# Patient Record
Sex: Male | Born: 1950 | Race: White | Hispanic: No | Marital: Married | Smoking: Current some day smoker
Health system: Southern US, Community
[De-identification: ages and names within clinical notes are randomized; demographics above are authoritative.]

## PROBLEM LIST (undated history)

## (undated) DIAGNOSIS — N189 Chronic kidney disease, unspecified: Secondary | ICD-10-CM

## (undated) DIAGNOSIS — I10 Essential (primary) hypertension: Secondary | ICD-10-CM

## (undated) DIAGNOSIS — K219 Gastro-esophageal reflux disease without esophagitis: Secondary | ICD-10-CM

## (undated) DIAGNOSIS — Z789 Other specified health status: Secondary | ICD-10-CM

## (undated) HISTORY — DX: Other specified health status: Z78.9

## (undated) HISTORY — DX: Chronic kidney disease, unspecified: N18.9

## (undated) HISTORY — PX: OTHER SURGICAL HISTORY: SHX169

## (undated) HISTORY — DX: Gastro-esophageal reflux disease without esophagitis: K21.9

## (undated) HISTORY — DX: Essential (primary) hypertension: I10

---

## 2020-06-10 ENCOUNTER — Ambulatory Visit
Admission: RE | Admit: 2020-06-10 | Discharge: 2020-06-10 | Disposition: A | Discharge status: Home / Self Care | Payer: Medicare Other | Source: Ambulatory Visit | Attending: Emergency Medicine | Admitting: Emergency Medicine

## 2020-06-10 ENCOUNTER — Other Ambulatory Visit: Payer: Self-pay

## 2020-06-10 VITALS — BP 185/93 | HR 89 | Temp 97.9°F | Resp 14 | Ht 66.0 in | Wt 160.0 lb

## 2020-06-10 DIAGNOSIS — R1012 Left upper quadrant pain: Secondary | ICD-10-CM

## 2020-06-10 DIAGNOSIS — R140 Abdominal distension (gaseous): Secondary | ICD-10-CM

## 2020-06-10 MED ORDER — DICYCLOMINE HCL 20 MG PO TABS: 20.0000 mg | ORAL_TABLET | Freq: Two times a day (BID) | ORAL | 0 refills | Status: DC

## 2020-06-10 NOTE — ED Triage Notes (Signed)
Intermittent LUQ pain x 6 months w/ bloating.  Moved here from [place] x 6 months ago

## 2020-06-10 NOTE — Discharge Instructions (Signed)
Unable to rule out diverticulitis in urgent care setting.  Offered patient further evaluation and management in the ED.  Patient declines at this time and would like to try outpatient therapy first.  Aware of the risk associated with this decision including missed diagnosis, organ damage, organ failure, and/or death.  Patient aware and in agreement.     Rest and push fluids  Bentyl for abdominal discomfort and bloating Begin keeping a food journal to see if certain foods are contributing to your symptoms Follow up with PCP for a more thorough work-up if symptoms persists If you experience new or worsening symptoms return or go to ER such as fever, chills, nausea, vomiting, diarrhea, bloody or dark tarry stools, constipation, urinary symptoms, worsening abdominal discomfort, symptoms that do not improve with medications, inability to keep fluids down, etc..Marland Kitchen

## 2020-06-10 NOTE — ED Provider Notes (Signed)
[place]   [id number] 06/10/20 Arrival Time: 1443  CC: ABDOMINAL DISCOMFORT  SUBJECTIVE:  Jeremy Rodriguez is a 70 y.o. male who presents with complaint of constant LUQ pain x 6 months, worse recently.  Symptoms began after moving here from [place].  Speculates change in diet may cause of symptoms.  Localizes pain to LUQ.  Describes as worsening, constant and sharp in character.  Has tried OTC medications with temporary relief.  Denies aggravating factors.  Denies similar symptoms in the past.  Last BM yesterday.  Reports intermittent constipation as well.    Denies fever, chills, appetite changes, weight changes, nausea, vomiting, chest pain, SOB, diarrhea, hematochezia, melena, dysuria, difficulty urinating, increased frequency or urgency, flank pain, loss of bowel or bladder function, vaginal discharge, vaginal odor, vaginal bleeding.  No LMP for male patient.  ROS: As per HPI.  All other pertinent ROS negative.     History reviewed. No pertinent past medical history. History reviewed. No pertinent surgical history. No Known Allergies No current facility-administered medications on file prior to encounter.   No current outpatient medications on file prior to encounter.   Social History   Socioeconomic History  . Marital status: Married    Spouse name: Not on file  . Number of children: Not on file  . Years of education: Not on file  . Highest education level: Not on file  Occupational History  . Not on file  Tobacco Use  . Smoking status: Current Some Day Smoker  . Smokeless tobacco: Never Used  Substance and Sexual Activity  . Alcohol use: Yes    Comment: occ  . Drug use: Never  . Sexual activity: Not on file  Other Topics Concern  . Not on file  Social History Narrative  . Not on file   Social Determinants of Health   Financial Resource Strain: Not on file  Food Insecurity: Not on file  Transportation Needs: Not on file  Physical Activity: Not on  file  Stress: Not on file  Social Connections: Not on file  Intimate Partner Violence: Not on file   No family history on file.   OBJECTIVE:  Vitals:   06/10/20 1010 06/10/20 1029  BP: (!) 185/93   Pulse: 89   Resp: 14   Temp: 97.9 F (36.6 C)   TempSrc: Oral   SpO2: 97%   Weight:  160 lb (72.6 kg)  Height:  5\' 6"  (1.676 m)    General appearance: Alert; NAD HEENT: NCAT.  Oropharynx clear.  Lungs: clear to auscultation bilaterally without adventitious breath sounds Heart: regular rate and rhythm.  Abdomen: soft, non-distended; normal active bowel sounds; TTP over LUQ; nontender at McBurney's point; no guarding Extremities: no edema; symmetrical with no gross deformities Skin: warm and dry Neurologic: normal gait Psychological: alert and cooperative; normal mood and affect  ASSESSMENT & PLAN:  1. LUQ discomfort   2. Bloating     Meds ordered this encounter  Medications  . dicyclomine (BENTYL) 20 MG tablet    Sig: Take 1 tablet (20 mg total) by mouth 2 (two) times daily.    Dispense:  20 tablet    Refill:  0    Order Specific Question:   Supervising Provider    Answer:   Raylene Everts [[id number]]   Unable to rule out diverticulitis in urgent care setting.  Offered patient further evaluation and management in the ED.  Patient declines at this time and would like to try outpatient therapy first.  Aware of the risk associated with this decision including missed diagnosis, organ damage, organ failure, and/or death.  Patient aware and in agreement.     Rest and push fluids  Bentyl for abdominal discomfort and bloating Begin keeping a food journal to see if certain foods are contributing to your symptoms Follow up with PCP for a more thorough work-up if symptoms persists If you experience new or worsening symptoms return or go to ER such as fever, chills, nausea, vomiting, diarrhea, bloody or dark tarry stools, constipation, urinary symptoms, worsening abdominal  discomfort, symptoms that do not improve with medications, inability to keep fluids down, etc...  Reviewed expectations re: course of current medical issues. Questions answered. Outlined signs and symptoms indicating need for more acute intervention. Patient verbalized understanding. After Visit Summary given.   Lestine Box, PA-C 06/10/20 1041

## 2020-06-24 ENCOUNTER — Encounter: Payer: Self-pay | Admitting: Internal Medicine

## 2020-06-24 ENCOUNTER — Ambulatory Visit (INDEPENDENT_AMBULATORY_CARE_PROVIDER_SITE_OTHER): Payer: Medicare Other | Admitting: Internal Medicine

## 2020-06-24 ENCOUNTER — Other Ambulatory Visit: Payer: Self-pay

## 2020-06-24 VITALS — BP 190/92 | HR 100 | Resp 18 | Ht 66.0 in | Wt 159.8 lb

## 2020-06-24 DIAGNOSIS — K5900 Constipation, unspecified: Secondary | ICD-10-CM

## 2020-06-24 DIAGNOSIS — F17210 Nicotine dependence, cigarettes, uncomplicated: Secondary | ICD-10-CM

## 2020-06-24 DIAGNOSIS — I10 Essential (primary) hypertension: Secondary | ICD-10-CM

## 2020-06-24 DIAGNOSIS — Z7689 Persons encountering health services in other specified circumstances: Secondary | ICD-10-CM

## 2020-06-24 DIAGNOSIS — K219 Gastro-esophageal reflux disease without esophagitis: Secondary | ICD-10-CM | POA: Diagnosis not present

## 2020-06-24 DIAGNOSIS — Z1159 Encounter for screening for other viral diseases: Secondary | ICD-10-CM | POA: Diagnosis not present

## 2020-06-24 DIAGNOSIS — Z114 Encounter for screening for human immunodeficiency virus [HIV]: Secondary | ICD-10-CM

## 2020-06-24 DIAGNOSIS — Z720 Tobacco use: Secondary | ICD-10-CM | POA: Insufficient documentation

## 2020-06-24 MED ORDER — DOCUSATE SODIUM 100 MG PO CAPS: 100.0000 mg | ORAL_CAPSULE | Freq: Every day | ORAL | 0 refills | Status: DC | PRN

## 2020-06-24 MED ORDER — TELMISARTAN 20 MG PO TABS: 20.0000 mg | ORAL_TABLET | Freq: Every day | ORAL | 0 refills | Status: DC

## 2020-06-24 MED ORDER — PANTOPRAZOLE SODIUM 40 MG PO TBEC
40.0000 mg | DELAYED_RELEASE_TABLET | Freq: Every day | ORAL | 3 refills | Status: DC
Start: 2020-06-24 — End: 2020-10-06

## 2020-06-24 NOTE — Assessment & Plan Note (Signed)
Chronic Started Colace Advised to maintain adequate hydration, at least 2 l of fluid intake

## 2020-06-24 NOTE — Progress Notes (Signed)
New Patient Office Visit  Subjective:  Patient ID: Jeremy Rodriguez, male    DOB: 11/21/1950  Age: 70 y.o. MRN: [id number]  CC:  Chief Complaint  Patient presents with  . New Patient (Initial Visit)    New patient had no pcp has been having bloating and stomach pains for about 2 months started after covid vaccine has tried alkaseltzer and tylenol     HPI Jeremy Rodriguez is a 70 year old male with no known past medical history who presents for establishing care.  His blood pressure was elevated on multiple measurements.  He states that he has been feeling nervous as this is his first visit.  He denies any headache, dizziness, chest pain, dyspnea or palpitations.  He complains of bloating and acid reflux sensation, which are intermittent and unrelated to food intake.  Of note, he admits to taking spicy food.  He denies any nausea, vomiting, melena or hematochezia.  He also has chronic constipation, for which he takes OTC fiber tablets.  He has not had any colonoscopy yet.  He has had 2 doses of COVID vaccine.  History reviewed. No pertinent past medical history.  History reviewed. No pertinent surgical history.  History reviewed. No pertinent family history.  Social History   Socioeconomic History  . Marital status: Married    Spouse name: Not on file  . Number of children: Not on file  . Years of education: Not on file  . Highest education level: Not on file  Occupational History  . Not on file  Tobacco Use  . Smoking status: Current Some Day Smoker  . Smokeless tobacco: Never Used  Substance and Sexual Activity  . Alcohol use: Yes    Comment: occ  . Drug use: Never  . Sexual activity: Not on file  Other Topics Concern  . Not on file  Social History Narrative  . Not on file   Social Determinants of Health   Financial Resource Strain: Not on file  Food Insecurity: Not on file  Transportation Needs: Not on file  Physical Activity: Not on file  Stress: Not on  file  Social Connections: Not on file  Intimate Partner Violence: Not on file    ROS Review of Systems  Constitutional: Negative for chills and fever.  HENT: Negative for congestion and sore throat.   Eyes: Negative for pain and discharge.  Respiratory: Negative for cough and shortness of breath.   Cardiovascular: Negative for chest pain and palpitations.  Gastrointestinal: Positive for abdominal distention and constipation. Negative for diarrhea, nausea and vomiting.  Endocrine: Negative for polydipsia and polyuria.  Genitourinary: Negative for dysuria and hematuria.  Musculoskeletal: Negative for neck pain and neck stiffness.  Skin: Negative for rash.  Neurological: Negative for dizziness, weakness, numbness and headaches.  Psychiatric/Behavioral: Negative for agitation and behavioral problems.    Objective:   Today's Vitals: BP (!) 190/92 (BP Location: Left Arm, Patient Position: Sitting)   Pulse 100   Resp 18   Ht '5\' 6"'  (1.676 m)   Wt 159 lb 12.8 oz (72.5 kg)   SpO2 98%   BMI 25.79 kg/m   Physical Exam Vitals reviewed.  Constitutional:      General: He is not in acute distress.    Appearance: He is not diaphoretic.  HENT:     Head: Normocephalic and atraumatic.     Nose: Nose normal.     Mouth/Throat:     Mouth: Mucous membranes are moist.  Eyes:     General:  No scleral icterus.    Extraocular Movements: Extraocular movements intact.     Pupils: Pupils are equal, round, and reactive to light.  Cardiovascular:     Rate and Rhythm: Normal rate and regular rhythm.     Pulses: Normal pulses.     Heart sounds: Normal heart sounds. No murmur heard.   Pulmonary:     Breath sounds: Normal breath sounds. No wheezing or rales.  Abdominal:     Palpations: Abdomen is soft.     Tenderness: There is no abdominal tenderness.  Musculoskeletal:     Cervical back: Neck supple. No tenderness.     Right lower leg: No edema.     Left lower leg: No edema.  Skin:     General: Skin is warm.     Findings: No rash.  Neurological:     General: No focal deficit present.     Mental Status: He is alert and oriented to person, place, and time.     Sensory: No sensory deficit.     Motor: No weakness.  Psychiatric:        Mood and Affect: Mood normal.        Behavior: Behavior normal.     Assessment & Plan:   Problem List Items Addressed This Visit      Encounter to establish care - Primary   Care established Previous chart reviewed History and medications reviewed with the patient      Relevant Orders  CBC  CMP14+EGFR  Lipid panel  HgB A1c  TSH    Cardiovascular and Mediastinum   Primary hypertension    BP Readings from Last 1 Encounters:  06/24/20 (!) 190/92   New-onset Could be elevated due to nervousness, but does not justify to have such an elevation of BP Started Telmisartan 20 mg QD Counseled for compliance with the medications Advised DASH diet and moderate exercise/walking, at least 150 mins/week       Relevant Medications   telmisartan (MICARDIS) 20 MG tablet     Digestive   Gastroesophageal reflux disease    Acid reflux and bloating could be due to GERD/gastritis Patient admits that he likes spicy food. Trial of Pantoprazole Referred to GI      Relevant Medications   pantoprazole (PROTONIX) 40 MG tablet   docusate sodium (COLACE) 100 MG capsule   Other Relevant Orders   Ambulatory referral to Gastroenterology     Other      Constipation    Chronic Started Colace Advised to maintain adequate hydration, at least 2 l of fluid intake      Relevant Medications   docusate sodium (COLACE) 100 MG capsule   Other Relevant Orders   Ambulatory referral to Gastroenterology   Tobacco abuse    Smokes about 1 pack/week.  Asked about quitting: confirms that he currently smokes cigarettes Advise to quit smoking: Educated about QUITTING to reduce the risk of cancer, cardio and cerebrovascular disease. Assess  willingness: Unwilling to quit at this time, but is working on cutting back. Assist with counseling and pharmacotherapy: Counseled for 5 minutes and literature provided. Arrange for follow up: Follow up in 3 months and continue to offer help.       Other Visit Diagnoses    Screening for HIV without presence of risk factors       Relevant Orders   HIV antibody (with reflex)   Encounter for HCV screening test for low risk patient       Relevant Orders  Hepatitis C Antibody      Outpatient Encounter Medications as of 06/24/2020  Medication Sig  . docusate sodium (COLACE) 100 MG capsule Take 1 capsule (100 mg total) by mouth daily as needed for mild constipation.  . pantoprazole (PROTONIX) 40 MG tablet Take 1 tablet (40 mg total) by mouth daily.  Marland Kitchen telmisartan (MICARDIS) 20 MG tablet Take 1 tablet (20 mg total) by mouth daily.  . [DISCONTINUED] dicyclomine (BENTYL) 20 MG tablet Take 1 tablet (20 mg total) by mouth 2 (two) times daily. (Patient not taking: Reported on 06/24/2020)   No facility-administered encounter medications on file as of 06/24/2020.    Follow-up: Return in about 3 weeks (around 07/15/2020) for Annual physical.   Lindell Spar, MD

## 2020-06-24 NOTE — Assessment & Plan Note (Addendum)
Care established Previous chart reviewed History and medications reviewed with the patient 

## 2020-06-24 NOTE — Patient Instructions (Signed)
Please start taking Olmesartan for blood pressure.  Please start taking Pantoprazole for gastritis/acid reflux and docusate for constipation.  Please get fasting blood tests before the next visit.  Please follow DASH diet and perform moderate exercise/walking at least 150 mins/week.

## 2020-06-24 NOTE — Assessment & Plan Note (Signed)
Acid reflux and bloating could be due to GERD/gastritis Patient admits that he likes spicy food. Trial of Pantoprazole Referred to GI

## 2020-06-24 NOTE — Assessment & Plan Note (Signed)
BP Readings from Last 1 Encounters:  06/24/20 (!) 190/92   New-onset Could be elevated due to nervousness, but does not justify to have such an elevation of BP Started Telmisartan 20 mg QD Counseled for compliance with the medications Advised DASH diet and moderate exercise/walking, at least 150 mins/week

## 2020-06-24 NOTE — Assessment & Plan Note (Signed)
Smokes about 1 pack/week.  Asked about quitting: confirms that he currently smokes cigarettes Advise to quit smoking: Educated about QUITTING to reduce the risk of cancer, cardio and cerebrovascular disease. Assess willingness: Unwilling to quit at this time, but is working on cutting back. Assist with counseling and pharmacotherapy: Counseled for 5 minutes and literature provided. Arrange for follow up: Follow up in 3 months and continue to offer help.

## 2020-06-30 ENCOUNTER — Other Ambulatory Visit: Payer: Self-pay | Admitting: Internal Medicine

## 2020-06-30 DIAGNOSIS — I10 Essential (primary) hypertension: Secondary | ICD-10-CM

## 2020-07-01 ENCOUNTER — Telehealth (INDEPENDENT_AMBULATORY_CARE_PROVIDER_SITE_OTHER): Payer: Medicare Other | Admitting: Internal Medicine

## 2020-07-01 ENCOUNTER — Other Ambulatory Visit: Payer: Self-pay

## 2020-07-01 ENCOUNTER — Encounter: Payer: Self-pay | Admitting: Internal Medicine

## 2020-07-01 DIAGNOSIS — R109 Unspecified abdominal pain: Secondary | ICD-10-CM | POA: Diagnosis not present

## 2020-07-01 DIAGNOSIS — R140 Abdominal distension (gaseous): Secondary | ICD-10-CM

## 2020-07-01 DIAGNOSIS — K5900 Constipation, unspecified: Secondary | ICD-10-CM

## 2020-07-01 MED ORDER — DICYCLOMINE HCL 10 MG PO CAPS: 10.0000 mg | ORAL_CAPSULE | Freq: Three times a day (TID) | ORAL | 0 refills | Status: DC | PRN

## 2020-07-01 NOTE — Progress Notes (Signed)
Virtual Visit via Telephone Note   This visit type was conducted due to national recommendations for restrictions regarding the COVID-19 Pandemic (e.g. social distancing) in an effort to limit this patient's exposure and mitigate transmission in our community.  Due to his co-morbid illnesses, this patient is at least at moderate risk for complications without adequate follow up.  This format is felt to be most appropriate for this patient at this time.  The patient did not have access to video technology/had technical difficulties with video requiring transitioning to audio format only (telephone).  All issues noted in this document were discussed and addressed.  No physical exam could be performed with this format.  Evaluation Performed:  Follow-up visit  Date:  07/01/2020   ID:  Jeremy Rodriguez, Jeremy Rodriguez 01-Jun-1950, MRN [id number]  Patient Location: Home Provider Location: Office/Clinic  Participants: Patient Location of Patient: Home Location of Provider: Telehealth Consent was obtain for visit to be over via telehealth. I verified that I am speaking with the correct person using two identifiers.  PCP:  Lindell Spar, MD   Chief Complaint:  Abdominal pain and cramps  History of Present Illness:    Jeremy Rodriguez is a 70 y.o. male who has a televisit for c/o abdominal bloating and cramping. He was recently placed on Pantoprazole for concern for GERD, but he states that it did not help him. He gets relief only with Alkaseltzer. Denies any melena or hematochezia.  The patient does not have symptoms concerning for COVID-19 infection (fever, chills, cough, or new shortness of breath).   Past Medical, Surgical, Social History, Allergies, and Medications have been Reviewed.  History reviewed. No pertinent past medical history. History reviewed. No pertinent surgical history.   Current Meds  Medication Sig  . dicyclomine (BENTYL) 10 MG capsule Take 1 capsule (10 mg total) by  mouth 3 (three) times daily as needed for spasms.  Marland Kitchen docusate sodium (COLACE) 100 MG capsule Take 1 capsule (100 mg total) by mouth daily as needed for mild constipation.  . pantoprazole (PROTONIX) 40 MG tablet Take 1 tablet (40 mg total) by mouth daily.  Marland Kitchen telmisartan (MICARDIS) 20 MG tablet Take 1 tablet by mouth once daily     Allergies:   Patient has no known allergies.   ROS:   Please see the history of present illness.    All other systems reviewed and are negative.   Labs/Other Tests and Data Reviewed:    Recent Labs: No results found for requested labs within last 8760 hours.   Recent Lipid Panel No results found for: CHOL, TRIG, HDL, CHOLHDL, LDLCALC, LDLDIRECT  Wt Readings from Last 3 Encounters:  06/24/20 159 lb 12.8 oz (72.5 kg)  06/10/20 160 lb (72.6 kg)      ASSESSMENT & PLAN:    Abdominal bloating with spasms Prescribed Bentyl Referred to GI If persistent, will check for CT abdomen and/or stool studies Advised to hold PPI for now and will check for H Pylori after 2 weeks  Constipation Continue Colace   Time:   Today, I have spent 9 minutes reviewing the chart, including problem list, medications, and with the patient with telehealth technology discussing the above problems.   Medication Adjustments/Labs and Tests Ordered: Current medicines are reviewed at length with the patient today.  Concerns regarding medicines are outlined above.   Tests Ordered: No orders of the defined types were placed in this encounter.   Medication Changes: Meds ordered this encounter  Medications  .  dicyclomine (BENTYL) 10 MG capsule    Sig: Take 1 capsule (10 mg total) by mouth 3 (three) times daily as needed for spasms.    Dispense:  30 capsule    Refill:  0     Note: This dictation was prepared with Dragon dictation along with smaller phrase technology. Similar sounding words can be transcribed inadequately or may not be corrected upon review. Any  transcriptional errors that result from this process are unintentional.      Disposition:  Follow up  Signed, Lindell Spar, MD  07/01/2020 1:13 PM     [facility]

## 2020-07-01 NOTE — Patient Instructions (Signed)
Please do not take Pantoprazole till H Pylori breath test.  Please take Dicyclomine for abdominal cramps.  Avoid hot and spicy food.

## 2020-07-03 ENCOUNTER — Ambulatory Visit: Payer: Medicare Other | Admitting: Gastroenterology

## 2020-07-03 ENCOUNTER — Encounter: Payer: Self-pay | Admitting: Gastroenterology

## 2020-07-03 ENCOUNTER — Other Ambulatory Visit: Payer: Self-pay

## 2020-07-03 ENCOUNTER — Ambulatory Visit (HOSPITAL_COMMUNITY)
Admission: RE | Admit: 2020-07-03 | Discharge: 2020-07-03 | Disposition: A | Discharge status: Home / Self Care | Payer: Medicare Other | Source: Ambulatory Visit | Attending: Gastroenterology | Admitting: Gastroenterology

## 2020-07-03 VITALS — BP 165/88 | HR 86 | Temp 97.6°F | Ht 66.0 in | Wt 158.4 lb

## 2020-07-03 DIAGNOSIS — Z1159 Encounter for screening for other viral diseases: Secondary | ICD-10-CM | POA: Diagnosis not present

## 2020-07-03 DIAGNOSIS — K5900 Constipation, unspecified: Secondary | ICD-10-CM

## 2020-07-03 DIAGNOSIS — R634 Abnormal weight loss: Secondary | ICD-10-CM | POA: Diagnosis not present

## 2020-07-03 DIAGNOSIS — Z114 Encounter for screening for human immunodeficiency virus [HIV]: Secondary | ICD-10-CM

## 2020-07-03 DIAGNOSIS — R109 Unspecified abdominal pain: Secondary | ICD-10-CM

## 2020-07-03 MED ORDER — IOHEXOL 9 MG/ML PO SOLN
ORAL | Status: AC
  Filled 2020-07-03: qty 1000

## 2020-07-03 NOTE — Patient Instructions (Signed)
Please have blood work done at Dr. Serita Grit office.  We are ordering a CT scan for you to be done as soon as possible.  For constipation: Start taking Linzess 1 capsule 30 minutes before breakfast daily. It is normal to have some looser stool starting out for the first few days, but this should improve. If it does not, please call us, as we will need to adjust the dosage. We can always increase this if needed.  Continue pantoprazole (Protonix) once each morning, 30 minutes before breakfast.  Stop dicyclomine (Bentyl) as this could worsen constipation.  Further recommendations to follow!  It was a pleasure to see you today. I want to create trusting relationships with patients to provide genuine, compassionate, and quality care. I value your feedback. If you receive a survey regarding your visit,  I greatly appreciate you taking time to fill this out.   Annitta Needs, PhD, ANP-BC [facility]

## 2020-07-03 NOTE — Progress Notes (Signed)
Primary Care Physician:  Lindell Spar, MD  Referring Physician: Dr. Posey Pronto Primary Gastroenterologist:  Dr. Abbey Chatters  Chief Complaint  Patient presents with  . Bloated  . Abdominal Pain    RUQ/LUQ  . Weight Loss    35lbs in 6 months. Feels bloated and no desire to eat  . Constipation    HPI:   Jeremy Rodriguez is a 70 y.o. male presenting today at the request of Dr. Posey Pronto due to constipation and GERD. He recently established care with Dr. Posey Pronto but previously had no health maintenance his whole life, stating he always felt well.    February last year took Covid vaccines and then feels like he saw difference a few weeks later. Mother passed away from [facility] 05-30-2019, and he traveled to [place] for her funeral. He noted while in [place] that he had a change in bowel habits, constipation, abdominal pain.  Takes alka seltzer and tylenol every 4-5 houres for pain. Feels bloated, no appetite. Rare BMs. Used to be able to eat hot, spicy no issues. Prior to Feb would have BM usually daily. Will go up to a week at a time. Has to strain. No rectal bleeding. No dysphagia. No desire to eat at all. Eats one time a day. Lost about 25 lbs, used to weigh in the high 170s. No family history of colon cancer. No family history of colon polyps, pancreatic, or liver disease.    Wakes up in middle of night with pain. Has pain in LUQ mainly but also upper abdomen. Takes pantoprazole once daily. No longer taking Micardis.   Bentyl daily.   Past Medical History:  Diagnosis Date  . Medical history non-contributory     Past Surgical History:  Procedure Laterality Date  . appendectomy      Current Outpatient Medications  Medication Sig Dispense Refill  . dicyclomine (BENTYL) 10 MG capsule Take 1 capsule (10 mg total) by mouth 3 (three) times daily as needed for spasms. 30 capsule 0  . docusate sodium (COLACE) 100 MG capsule Take 1 capsule (100 mg total) by mouth daily as needed for mild  constipation. 30 capsule 0  . pantoprazole (PROTONIX) 40 MG tablet Take 1 tablet (40 mg total) by mouth daily. 30 tablet 3   No current facility-administered medications for this visit.    Allergies as of 07/03/2020  . (No Known Allergies)    Family History  Problem Relation Age of Onset  . Colon cancer Neg Hx   . Colon polyps Neg Hx   . Liver cancer Neg Hx   . Pancreatic cancer Neg Hx     Social History   Socioeconomic History  . Marital status: Married    Spouse name: Not on file  . Number of children: Not on file  . Years of education: Not on file  . Highest education level: Not on file  Occupational History  . Not on file  Tobacco Use  . Smoking status: Current Some Day Smoker  . Smokeless tobacco: Never Used  . Tobacco comment: a few cigarettes a day  Substance and Sexual Activity  . Alcohol use: Yes    Comment: occ  . Drug use: Never  . Sexual activity: Not on file  Other Topics Concern  . Not on file  Social History Narrative  . Not on file   Social Determinants of Health   Financial Resource Strain: Not on file  Food Insecurity: Not on file  Transportation Needs: Not  on file  Physical Activity: Not on file  Stress: Not on file  Social Connections: Not on file  Intimate Partner Violence: Not on file    Review of Systems: Gen: see HPI CV: Denies chest pain, heart palpitations, peripheral edema, syncope.  Resp: Denies shortness of breath at rest or with exertion. Denies wheezing or cough.  GI: see HPI GU : Denies urinary burning, urinary frequency, urinary hesitancy MS: Denies joint pain, muscle weakness, cramps, or limitation of movement.  Derm: Denies rash, itching, dry skin Psych: Denies depression, anxiety, memory loss, and confusion Heme: Denies bruising, bleeding, and enlarged lymph nodes.  Physical Exam: BP (!) 165/88   Pulse 86   Temp 97.6 F (36.4 C)   Ht 5\' 6"  (1.676 m)   Wt 158 lb 6.4 oz (71.8 kg)   BMI 25.57 kg/m  General:    Alert and oriented. Pleasant and cooperative. Well-nourished and well-developed.  Head:  Normocephalic and atraumatic. Eyes:  Without icterus, sclera clear and conjunctiva pink.  Ears:  Normal auditory acuity. Mouth: mask in place Lungs:  Clear to auscultation bilaterally. No wheezes, rales, or rhonchi. No distress.  Heart:  S1, S2 present without murmurs appreciated.  Abdomen:  +BS, soft, mild TTP LLQ and non-distended. No HSM noted. No guarding or rebound. No masses appreciated.  Rectal:  declined Msk:  Symmetrical without gross deformities. Normal posture. Extremities:  Without edema. Neurologic:  Alert and  oriented x4;  grossly normal neurologically. Skin:  Intact without significant lesions or rashes. Psych:  Alert and cooperative. Normal mood and affect.  ASSESSMENT: Jeremy Rodriguez is a 70 y.o. male presenting today with at least year long symptoms of abdominal pain, change in bowel habits and now constipation, and self-reported weight loss, with onset around February of last year.  Up until last year, he reports no chronic health conditions and actually had no health maintenance up until recently when he established care with Dr. Posey Pronto here in [place].   Nocturnal abdominal pain and weight loss are concerning. He is declining a rectal exam today. We discussed colonoscopy/EGD, but he wants to hold off on this unless absolutely necessary. However, he is agreeable to CT scan and labs. Differentials broad but concern for occult malignancy especially in light of marked change in bowel habits and weight loss. Unless obvious findings on CT, still highly recommend colonoscopy/EGD in near future.  I have ordered CT abd/pelvis for today, and I have also placed lab orders to be done by patient. Will make sure to copy PCP on this, as some were ordered for primary care purposes.   For constipation: start Linzess 145 mcg daily. Samples provided.  Continue Protonix daily. I have asked him to  stop Bentyl as this could worsen constipation.    PLAN: CT abd/pelvis today Labs ordered Continue Protonix Start Linzess 145 mcg daily Recommend colonoscopy/EGD after review of CT Further recommendations to follow   Annitta Needs, PhD, ANP-BC [facility]

## 2020-07-06 ENCOUNTER — Telehealth: Payer: Self-pay | Admitting: Gastroenterology

## 2020-07-06 DIAGNOSIS — R109 Unspecified abdominal pain: Secondary | ICD-10-CM

## 2020-07-06 DIAGNOSIS — R634 Abnormal weight loss: Secondary | ICD-10-CM

## 2020-07-06 NOTE — Telephone Encounter (Signed)
Patient called back and states he does not want to have CT done because he has to drink contrast. He states he doesn't want to drink anything to have a test done. Wants to know if others ways can be done to check what Jeremy Rodriguez is wanting to check

## 2020-07-06 NOTE — Telephone Encounter (Signed)
[facility] clinical pool:  When will CT be? It looks like he was to have it Friday but did not complete? Can we make sure this is scheduled for this week.

## 2020-07-06 NOTE — Progress Notes (Signed)
Cc'ed to pcp °

## 2020-07-06 NOTE — Telephone Encounter (Signed)
Patient was scheduled for Friday and per note "Pt left, did not want to drink contrast and wait". Was not rescheduled.  I called pt to see if he still wants to have done. LMOVM for him to call back

## 2020-07-07 ENCOUNTER — Other Ambulatory Visit: Payer: Self-pay | Admitting: Gastroenterology

## 2020-07-07 MED ORDER — LINACLOTIDE 145 MCG PO CAPS: 145.0000 ug | ORAL_CAPSULE | Freq: Every day | ORAL | 5 refills | Status: DC

## 2020-07-07 NOTE — Telephone Encounter (Signed)
I contacted patient. He does not want to drink anything.   We can do the CT with IV contrast only. He is not wanting to do an endoscopy/colonoscopy at this time. Please arrange CT with IV contrast only.  He will get labs done tomorrow.  I have sent in Linzess 145 mcg daily to his pharmacy, as this is working well for him and has helped with constipation, abdominal pain, and bloating.

## 2020-07-07 NOTE — Addendum Note (Signed)
Addended by: Cheron Every on: 07/07/2020 04:15 PM   Modules accepted: Orders

## 2020-07-07 NOTE — Addendum Note (Signed)
Addended by: Cheron Every on: 07/07/2020 04:21 PM   Modules accepted: Orders

## 2020-07-07 NOTE — Telephone Encounter (Signed)
Called CS and pt scheduled for 4/8 at 1:00pm, arrival 12:45pm, npo 4 hrs prior  Called pt, LMOVM

## 2020-07-07 NOTE — Progress Notes (Signed)
Linzess 145 mcg sent to pharmacy.

## 2020-07-08 NOTE — Telephone Encounter (Signed)
Called pt and made aware of CT appt. He voiced understanding and had no questions

## 2020-07-10 ENCOUNTER — Ambulatory Visit (HOSPITAL_COMMUNITY)
Admission: RE | Admit: 2020-07-10 | Discharge: 2020-07-10 | Disposition: A | Discharge status: Home / Self Care | Payer: Medicare Other | Source: Ambulatory Visit | Attending: Gastroenterology | Admitting: Gastroenterology

## 2020-07-10 ENCOUNTER — Other Ambulatory Visit: Payer: Self-pay

## 2020-07-10 DIAGNOSIS — R634 Abnormal weight loss: Secondary | ICD-10-CM | POA: Insufficient documentation

## 2020-07-10 DIAGNOSIS — R109 Unspecified abdominal pain: Secondary | ICD-10-CM | POA: Diagnosis not present

## 2020-07-10 LAB — POCT I-STAT CREATININE: Creatinine, Ser: 1 mg/dL (ref 0.61–1.24)

## 2020-07-10 MED ORDER — IOHEXOL 300 MG/ML  SOLN
100.0000 mL | Freq: Once | INTRAMUSCULAR | Status: AC | PRN
  Administered 2020-07-10: 100 mL via INTRAVENOUS

## 2020-07-13 ENCOUNTER — Telehealth: Payer: Self-pay

## 2020-07-13 NOTE — Telephone Encounter (Signed)
Addressed under result note 

## 2020-07-13 NOTE — Telephone Encounter (Signed)
Diane from [facility] called with a report for this pt. Please review CT report in Epic asap.

## 2020-07-14 ENCOUNTER — Other Ambulatory Visit: Payer: Self-pay

## 2020-07-14 DIAGNOSIS — N2889 Other specified disorders of kidney and ureter: Secondary | ICD-10-CM

## 2020-07-14 DIAGNOSIS — R160 Hepatomegaly, not elsewhere classified: Secondary | ICD-10-CM

## 2020-07-15 ENCOUNTER — Other Ambulatory Visit: Payer: Self-pay

## 2020-07-15 ENCOUNTER — Encounter: Payer: Self-pay | Admitting: Internal Medicine

## 2020-07-15 ENCOUNTER — Telehealth: Payer: Self-pay

## 2020-07-15 ENCOUNTER — Ambulatory Visit (INDEPENDENT_AMBULATORY_CARE_PROVIDER_SITE_OTHER): Payer: Medicare Other | Admitting: Internal Medicine

## 2020-07-15 VITALS — BP 168/90 | HR 85 | Temp 97.6°F | Ht 66.0 in | Wt 154.0 lb

## 2020-07-15 DIAGNOSIS — I10 Essential (primary) hypertension: Secondary | ICD-10-CM | POA: Diagnosis not present

## 2020-07-15 DIAGNOSIS — G893 Neoplasm related pain (acute) (chronic): Secondary | ICD-10-CM | POA: Diagnosis not present

## 2020-07-15 DIAGNOSIS — Z0000 Encounter for general adult medical examination without abnormal findings: Secondary | ICD-10-CM

## 2020-07-15 DIAGNOSIS — Z7689 Persons encountering health services in other specified circumstances: Secondary | ICD-10-CM | POA: Diagnosis not present

## 2020-07-15 DIAGNOSIS — N2889 Other specified disorders of kidney and ureter: Secondary | ICD-10-CM | POA: Diagnosis not present

## 2020-07-15 DIAGNOSIS — C7910 Secondary malignant neoplasm of unspecified urinary organs: Secondary | ICD-10-CM | POA: Diagnosis not present

## 2020-07-15 DIAGNOSIS — Z1159 Encounter for screening for other viral diseases: Secondary | ICD-10-CM | POA: Diagnosis not present

## 2020-07-15 MED ORDER — AMLODIPINE BESYLATE 5 MG PO TABS: 5.0000 mg | ORAL_TABLET | Freq: Every day | ORAL | 1 refills | Status: DC

## 2020-07-15 MED ORDER — OXYCODONE-ACETAMINOPHEN 5-325 MG PO TABS
1.0000 | ORAL_TABLET | Freq: Four times a day (QID) | ORAL | 0 refills | Status: DC | PRN
Start: 2020-07-15 — End: 2020-07-22

## 2020-07-15 NOTE — Assessment & Plan Note (Signed)
CT abdomen reviewed Has appointment with Oncology for further management

## 2020-07-15 NOTE — Telephone Encounter (Signed)
Error

## 2020-07-15 NOTE — Assessment & Plan Note (Signed)
Annual exam as documented. Counseling done  re healthy lifestyle involving commitment to 150 minutes exercise per week, heart healthy diet, and attaining healthy weight.The importance of adequate sleep also discussed. Changes in health habits are decided on by the patient with goals and time frames  set for achieving them. Immunization and cancer screening needs are specifically addressed at this visit. 

## 2020-07-15 NOTE — Assessment & Plan Note (Signed)
BP Readings from Last 1 Encounters:  07/15/20 (!) 168/90   Uncontrolled Started Amlodipine Had started Telmisartan, but would avoid currently in the context of renal mass Counseled for compliance with the medications Advised DASH diet and moderate exercise/walking as tolerated

## 2020-07-15 NOTE — Assessment & Plan Note (Signed)
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

## 2020-07-15 NOTE — Progress Notes (Signed)
Established Patient Office Visit  Subjective:  Patient ID: Jeremy Rodriguez, male    DOB: [birth year]  Age: 70 y.o. MRN: [id number]  CC:  Chief Complaint  Patient presents with  . Annual Exam    CPE  . Abdominal Pain    Was told he has a mass on L side, sees oncology next week, but is having pain that Tylenol isn't helping.    HPI Jeremy Rodriguez is a 70 year old male with PMH of HTN, GERD and left renal mass who presents for annual physical.  He has been having left abdominal pain/discomfort and it is not relieved with Tylenol. CT abdomen revealed left renal mass, for which he has an appointment with Oncology. He denies any dysuria, hematuria, melena or hematochezia.  His BP was elevated today and has not been taking any medication for it.  He has been on Pantoprazole for GERD.  Past Medical History:  Diagnosis Date  . Medical history non-contributory     Past Surgical History:  Procedure Laterality Date  . appendectomy      Family History  Problem Relation Age of Onset  . Colon cancer Neg Hx   . Colon polyps Neg Hx   . Liver cancer Neg Hx   . Pancreatic cancer Neg Hx     Social History   Socioeconomic History  . Marital status: Married    Spouse name: Not on file  . Number of children: Not on file  . Years of education: Not on file  . Highest education level: Not on file  Occupational History  . Not on file  Tobacco Use  . Smoking status: Current Some Day Smoker  . Smokeless tobacco: Never Used  . Tobacco comment: a few cigarettes a day  Substance and Sexual Activity  . Alcohol use: Yes    Comment: occ  . Drug use: Never  . Sexual activity: Not on file  Other Topics Concern  . Not on file  Social History Narrative  . Not on file   Social Determinants of Health   Financial Resource Strain: Not on file  Food Insecurity: Not on file  Transportation Needs: Not on file  Physical Activity: Not on file  Stress: Not on file  Social Connections:  Not on file  Intimate Partner Violence: Not on file    Outpatient Medications Prior to Visit  Medication Sig Dispense Refill  . docusate sodium (COLACE) 100 MG capsule Take 1 capsule (100 mg total) by mouth daily as needed for mild constipation. 30 capsule 0  . linaclotide (LINZESS) 145 MCG CAPS capsule Take 1 capsule (145 mcg total) by mouth daily before breakfast. 30 capsule 5  . pantoprazole (PROTONIX) 40 MG tablet Take 1 tablet (40 mg total) by mouth daily. 30 tablet 3  . dicyclomine (BENTYL) 10 MG capsule Take 1 capsule (10 mg total) by mouth 3 (three) times daily as needed for spasms. 30 capsule 0   No facility-administered medications prior to visit.    No Known Allergies  ROS Review of Systems  Constitutional: Negative for chills and fever.  HENT: Negative for congestion and sore throat.   Eyes: Negative for pain and discharge.  Respiratory: Negative for cough and shortness of breath.   Cardiovascular: Negative for chest pain and palpitations.  Gastrointestinal: Positive for abdominal distention and abdominal pain (LLQ). Negative for constipation, diarrhea, nausea and vomiting.  Endocrine: Negative for polydipsia and polyuria.  Genitourinary: Negative for dysuria and hematuria.  Musculoskeletal: Negative for neck pain and neck  stiffness.  Skin: Negative for rash.  Neurological: Negative for dizziness, weakness, numbness and headaches.  Psychiatric/Behavioral: Negative for agitation and behavioral problems.      Objective:    Physical Exam Vitals reviewed.  Constitutional:      General: He is not in acute distress.    Appearance: He is not diaphoretic.  HENT:     Head: Normocephalic and atraumatic.     Nose: Nose normal.     Mouth/Throat:     Mouth: Mucous membranes are moist.  Eyes:     General: No scleral icterus.    Extraocular Movements: Extraocular movements intact.     Pupils: Pupils are equal, round, and reactive to light.  Cardiovascular:     Rate and  Rhythm: Normal rate and regular rhythm.     Pulses: Normal pulses.     Heart sounds: Normal heart sounds. No murmur heard.   Pulmonary:     Breath sounds: Normal breath sounds. No wheezing or rales.  Abdominal:     Palpations: Abdomen is soft.     Tenderness: There is abdominal tenderness (Mild (LLQ)). There is no guarding or rebound.  Musculoskeletal:     Cervical back: Neck supple. No tenderness.     Right lower leg: No edema.     Left lower leg: No edema.  Skin:    General: Skin is warm.     Findings: No rash.  Neurological:     General: No focal deficit present.     Mental Status: He is alert and oriented to person, place, and time.     Sensory: No sensory deficit.     Motor: No weakness.  Psychiatric:        Mood and Affect: Mood is anxious.        Behavior: Behavior normal.     BP (!) 168/90 (BP Location: Right Arm, Patient Position: Sitting, Cuff Size: Normal)   Pulse 85   Temp 97.6 F (36.4 C) (Temporal)   Ht 5\' 6"  (1.676 m)   Wt 154 lb (69.9 kg)   SpO2 98%   BMI 24.86 kg/m  Wt Readings from Last 3 Encounters:  07/15/20 154 lb (69.9 kg)  07/03/20 158 lb 6.4 oz (71.8 kg)  06/24/20 159 lb 12.8 oz (72.5 kg)     Health Maintenance Due  Topic Date Due  . Hepatitis C Screening  Never done    There are no preventive care reminders to display for this patient.  No results found for: TSH No results found for: WBC, HGB, HCT, MCV, PLT Lab Results  Component Value Date   CREATININE 1.00 07/10/2020   No results found for: CHOL No results found for: HDL No results found for: LDLCALC No results found for: TRIG No results found for: CHOLHDL No results found for: HGBA1C    Assessment & Plan:   Problem List Items Addressed This Visit      Annual physical exam - Primary   Annual exam as documented. Counseling done  re healthy lifestyle involving commitment to 150 minutes exercise per week, heart healthy diet, and attaining healthy weight.The importance of  adequate sleep also discussed. Changes in health habits are decided on by the patient with goals and time frames  set for achieving them. Immunization and cancer screening needs are specifically addressed at this visit.      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 oxyCODONE-acetaminophen (PERCOCET/ROXICET) 5-325 MG tablet    Sig: Take 1 tablet by mouth every 6 (six) hours as needed for severe pain.    Dispense:  30 tablet    Refill:  0  . amLODipine (NORVASC) 5 MG tablet    Sig: Take 1 tablet (5 mg total) by mouth daily.    Dispense:  90 tablet    Refill:  1    Follow-up: Return in about 4 months (around 11/14/2020) for HTN and renal mass.    Lindell Spar, MD

## 2020-07-15 NOTE — Patient Instructions (Signed)
Please take Percocet for severe pain.  Please follow up with Oncology as scheduled.  Please start taking Amlodipine for high BP. Continue to follow low salt diet.  Please refrain from smoking.

## 2020-07-16 ENCOUNTER — Other Ambulatory Visit: Payer: Self-pay

## 2020-07-16 DIAGNOSIS — R634 Abnormal weight loss: Secondary | ICD-10-CM

## 2020-07-16 DIAGNOSIS — N2889 Other specified disorders of kidney and ureter: Secondary | ICD-10-CM

## 2020-07-16 DIAGNOSIS — R160 Hepatomegaly, not elsewhere classified: Secondary | ICD-10-CM

## 2020-07-16 LAB — CMP14+EGFR
ALT: 10 IU/L (ref 0–44)
AST: 16 IU/L (ref 0–40)
Albumin/Globulin Ratio: 1.3 (ref 1.2–2.2)
Albumin: 4.2 g/dL (ref 3.8–4.8)
Alkaline Phosphatase: 68 IU/L (ref 44–121)
BUN/Creatinine Ratio: 9 — ABNORMAL LOW (ref 10–24)
BUN: 10 mg/dL (ref 8–27)
Bilirubin Total: 0.5 mg/dL (ref 0.0–1.2)
CO2: 22 mmol/L (ref 20–29)
Calcium: 9.7 mg/dL (ref 8.6–10.2)
Chloride: 102 mmol/L (ref 96–106)
Creatinine, Ser: 1.13 mg/dL (ref 0.76–1.27)
Globulin, Total: 3.3 g/dL (ref 1.5–4.5)
Glucose: 94 mg/dL (ref 65–99)
Potassium: 4.9 mmol/L (ref 3.5–5.2)
Sodium: 140 mmol/L (ref 134–144)
Total Protein: 7.5 g/dL (ref 6.0–8.5)
eGFR: 70 mL/min/{1.73_m2} (ref >59)

## 2020-07-16 LAB — LIPID PANEL
Chol/HDL Ratio: 4.3 ratio (ref 0.0–5.0)
Cholesterol, Total: 180 mg/dL (ref 100–199)
HDL: 42 mg/dL (ref >39)
LDL Chol Calc (NIH): 103 mg/dL — ABNORMAL HIGH (ref 0–99)
Triglycerides: 205 mg/dL — ABNORMAL HIGH (ref 0–149)
VLDL Cholesterol Cal: 35 mg/dL (ref 5–40)

## 2020-07-16 LAB — CBC
Hematocrit: 42.5 % (ref 37.5–51.0)
Hemoglobin: 13.9 g/dL (ref 13.0–17.7)
MCH: 27.3 pg (ref 26.6–33.0)
MCHC: 32.7 g/dL (ref 31.5–35.7)
MCV: 84 fL (ref 79–97)
Platelets: 330 10*3/uL (ref 150–450)
RBC: 5.09 x10E6/uL (ref 4.14–5.80)
RDW: 15.5 % — ABNORMAL HIGH (ref 11.6–15.4)
WBC: 8.1 10*3/uL (ref 3.4–10.8)

## 2020-07-16 LAB — HEMOGLOBIN A1C
Est. average glucose Bld gHb Est-mCnc: 123 mg/dL
Hgb A1c MFr Bld: 5.9 % — ABNORMAL HIGH (ref 4.8–5.6)

## 2020-07-16 LAB — HIV ANTIBODY (ROUTINE TESTING W REFLEX): HIV Screen 4th Generation wRfx: NONREACTIVE

## 2020-07-16 LAB — TSH: TSH: 1.52 u[IU]/mL (ref 0.450–4.500)

## 2020-07-16 LAB — HEPATITIS C ANTIBODY: Hep C Virus Ab: <0.1 s/co ratio (ref 0.0–0.9)

## 2020-07-20 ENCOUNTER — Ambulatory Visit (HOSPITAL_COMMUNITY)
Admission: RE | Admit: 2020-07-20 | Discharge: 2020-07-20 | Disposition: A | Discharge status: Home / Self Care | Payer: Medicare Other | Source: Ambulatory Visit | Attending: Gastroenterology | Admitting: Gastroenterology

## 2020-07-20 ENCOUNTER — Other Ambulatory Visit: Payer: Self-pay

## 2020-07-20 DIAGNOSIS — M47814 Spondylosis without myelopathy or radiculopathy, thoracic region: Secondary | ICD-10-CM | POA: Diagnosis not present

## 2020-07-20 DIAGNOSIS — I2510 Atherosclerotic heart disease of native coronary artery without angina pectoris: Secondary | ICD-10-CM | POA: Diagnosis not present

## 2020-07-20 DIAGNOSIS — N2889 Other specified disorders of kidney and ureter: Secondary | ICD-10-CM

## 2020-07-20 DIAGNOSIS — R634 Abnormal weight loss: Secondary | ICD-10-CM

## 2020-07-20 DIAGNOSIS — E278 Other specified disorders of adrenal gland: Secondary | ICD-10-CM | POA: Diagnosis not present

## 2020-07-20 DIAGNOSIS — S2242XA Multiple fractures of ribs, left side, initial encounter for closed fracture: Secondary | ICD-10-CM | POA: Diagnosis not present

## 2020-07-20 DIAGNOSIS — R160 Hepatomegaly, not elsewhere classified: Secondary | ICD-10-CM

## 2020-07-20 DIAGNOSIS — Z87448 Personal history of other diseases of urinary system: Secondary | ICD-10-CM | POA: Diagnosis not present

## 2020-07-20 MED ORDER — GADOBUTROL 1 MMOL/ML IV SOLN
7.0000 mL | Freq: Once | INTRAVENOUS | Status: AC | PRN
  Administered 2020-07-20: 7 mL via INTRAVENOUS

## 2020-07-20 MED ORDER — IOHEXOL 300 MG/ML  SOLN
75.0000 mL | Freq: Once | INTRAMUSCULAR | Status: AC | PRN
  Administered 2020-07-20: 75 mL via INTRAVENOUS

## 2020-07-21 ENCOUNTER — Telehealth: Payer: Self-pay | Admitting: Internal Medicine

## 2020-07-21 NOTE — Telephone Encounter (Signed)
Pt's daughter, called to say that she was told to call and ask for Roseanne Kaufman, NP regarding her father's results. Please call [phone]

## 2020-07-21 NOTE — Telephone Encounter (Signed)
I spoke to daughter, [name], and answered questions to best of my ability. She will be going to the Oncology appointment tomorrow with her father.

## 2020-07-21 NOTE — Telephone Encounter (Signed)
Spoke with patient on phone. He also stated it was ok to talk with his daughter, [place].

## 2020-07-22 ENCOUNTER — Other Ambulatory Visit (HOSPITAL_COMMUNITY): Payer: Self-pay

## 2020-07-22 ENCOUNTER — Inpatient Hospital Stay (HOSPITAL_COMMUNITY): Payer: Medicare Other | Attending: Hematology | Admitting: Hematology

## 2020-07-22 ENCOUNTER — Encounter (HOSPITAL_COMMUNITY): Payer: Self-pay

## 2020-07-22 ENCOUNTER — Other Ambulatory Visit: Payer: Self-pay

## 2020-07-22 ENCOUNTER — Encounter (HOSPITAL_COMMUNITY): Payer: Self-pay | Admitting: Hematology

## 2020-07-22 ENCOUNTER — Other Ambulatory Visit: Payer: Self-pay | Admitting: Internal Medicine

## 2020-07-22 VITALS — BP 125/76 | HR 86 | Temp 96.8°F | Resp 18 | Ht 66.0 in | Wt 157.2 lb

## 2020-07-22 DIAGNOSIS — R1012 Left upper quadrant pain: Secondary | ICD-10-CM | POA: Diagnosis not present

## 2020-07-22 DIAGNOSIS — C652 Malignant neoplasm of left renal pelvis: Secondary | ICD-10-CM | POA: Diagnosis not present

## 2020-07-22 DIAGNOSIS — R140 Abdominal distension (gaseous): Secondary | ICD-10-CM | POA: Insufficient documentation

## 2020-07-22 DIAGNOSIS — K769 Liver disease, unspecified: Secondary | ICD-10-CM | POA: Diagnosis not present

## 2020-07-22 DIAGNOSIS — K5900 Constipation, unspecified: Secondary | ICD-10-CM | POA: Insufficient documentation

## 2020-07-22 DIAGNOSIS — Z79899 Other long term (current) drug therapy: Secondary | ICD-10-CM | POA: Diagnosis not present

## 2020-07-22 DIAGNOSIS — N189 Chronic kidney disease, unspecified: Secondary | ICD-10-CM | POA: Insufficient documentation

## 2020-07-22 DIAGNOSIS — D4102 Neoplasm of uncertain behavior of left kidney: Secondary | ICD-10-CM | POA: Diagnosis not present

## 2020-07-22 DIAGNOSIS — G893 Neoplasm related pain (acute) (chronic): Secondary | ICD-10-CM

## 2020-07-22 DIAGNOSIS — F17210 Nicotine dependence, cigarettes, uncomplicated: Secondary | ICD-10-CM | POA: Diagnosis not present

## 2020-07-22 DIAGNOSIS — R634 Abnormal weight loss: Secondary | ICD-10-CM | POA: Diagnosis not present

## 2020-07-22 DIAGNOSIS — I129 Hypertensive chronic kidney disease with stage 1 through stage 4 chronic kidney disease, or unspecified chronic kidney disease: Secondary | ICD-10-CM | POA: Insufficient documentation

## 2020-07-22 DIAGNOSIS — R630 Anorexia: Secondary | ICD-10-CM | POA: Diagnosis not present

## 2020-07-22 MED ORDER — OXYCODONE-ACETAMINOPHEN 5-325 MG PO TABS: 1.0000 | ORAL_TABLET | Freq: Two times a day (BID) | ORAL | 0 refills | Status: DC | PRN

## 2020-07-22 NOTE — Progress Notes (Signed)
[facility] [address], [place] [address]   CLINIC:  Medical Oncology/Hematology  CONSULT NOTE  Patient Care Team: Lindell Spar, MD as PCP - General (Internal Medicine) Eloise Harman, DO as Consulting Physician (Internal Medicine) Brien Mates, RN as Oncology Nurse Navigator (Oncology)  CHIEF COMPLAINTS/PURPOSE OF CONSULTATION:  Evaluation of left kidney neoplasm with liver metastases  HISTORY OF PRESENTING ILLNESS:  [name] 70 y.o. male is here because of left kidney neoplasm with liver metastases, at the request of Dr. Ihor Dow from [facility].  Today he is accompanied by his daughter, Jeremy Rodriguez, and he reports feeling okay. He denies having hematuria or frequent UTI's. He has lost 20 pounds in the past 4 months due to decreased appetite and left flank pain. He reports having the intermittent left flank pain, sometimes dull and at other times sharp lasting for several minutes several times per day, for the past 2 to 3 months and is getting worse but denies radiation to back or abdomen; he takes Percocet as needed, usually twice daily, which he has been taking since March. He also reports having constipation for the past 1 month since starting Percocet. He also reports having frequent abdominal bloating for which he is taking Protonix daily and occasional alka seltzer.  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 He reports having carbon dust exposure while working at [employer]. He has cut down on his smoking and used to smoke 1/2 PPD for 38 years. He is originally from [place]. He denies having family history of cancer.   MEDICAL HISTORY:  Past Medical History:  Diagnosis Date  . Chronic kidney disease   . GERD (gastroesophageal reflux disease)   . Hypertension   . Medical history non-contributory     SURGICAL HISTORY: Past Surgical History:  Procedure Laterality Date  . appendectomy      SOCIAL  HISTORY: Social History   Socioeconomic History  . Marital status: Single    Spouse name: Not on file  . Number of children: Not on file  . Years of education: Not on file  . Highest education level: Not on file  Occupational History  . Not on file  Tobacco Use  . Smoking status: Current Some Day Smoker  . Smokeless tobacco: Never Used  . Tobacco comment: a few cigarettes a day  Substance and Sexual Activity  . Alcohol use: Yes    Comment: occ  . Drug use: Never  . Sexual activity: Not on file  Other Topics Concern  . Not on file  Social History Narrative  . Not on file   Social Determinants of Health   Financial Resource Strain: Low Risk   . Difficulty of Paying Living Expenses: Not very hard  Food Insecurity: No Food Insecurity  . Worried About Charity fundraiser in the Last Year: Never true  . Ran Out of Food in the Last Year: Never true  Transportation Needs: No Transportation Needs  . Lack of Transportation (Medical): No  . Lack of Transportation (Non-Medical): No  Physical Activity: Inactive  . Days of Exercise per Week: 0 days  . Minutes of Exercise per Session: 0 min  Stress: No Stress Concern Present  . Feeling of Stress : Not at all  Social Connections: Unknown  . Frequency of Communication with Friends and Family: Twice a week  . Frequency of Social Gatherings with Friends and Family: Twice a week  . Attends Religious  Services: Not on file  . Active Member of Clubs or Organizations: Not on file  . Attends Archivist Meetings: Not on file  . Marital Status: Divorced  Human resources officer Violence: Not on file    FAMILY HISTORY: Family History  Problem Relation Age of Onset  . Colon cancer Neg Hx   . Colon polyps Neg Hx   . Liver cancer Neg Hx   . Pancreatic cancer Neg Hx     ALLERGIES:  has No Known Allergies.  MEDICATIONS:  Current Outpatient Medications  Medication Sig Dispense Refill  . amLODipine (NORVASC) 5 MG tablet Take 1 tablet  (5 mg total) by mouth daily. 90 tablet 1  . docusate sodium (COLACE) 100 MG capsule Take 1 capsule (100 mg total) by mouth daily as needed for mild constipation. 30 capsule 0  . linaclotide (LINZESS) 145 MCG CAPS capsule Take 1 capsule (145 mcg total) by mouth daily before breakfast. 30 capsule 5  . oxyCODONE-acetaminophen (PERCOCET/ROXICET) 5-325 MG tablet Take 1 tablet by mouth every 6 (six) hours as needed for severe pain. 30 tablet 0  . pantoprazole (PROTONIX) 40 MG tablet Take 1 tablet (40 mg total) by mouth daily. 30 tablet 3   No current facility-administered medications for this visit.    REVIEW OF SYSTEMS:   Review of Systems  Constitutional: Positive for appetite change (50%), fatigue (75%) and unexpected weight change (lost 20 lbs in 4 months).  Gastrointestinal: Positive for abdominal pain (L flank intermittent pain) and constipation.  Genitourinary: Negative for hematuria.   All other systems reviewed and are negative.    PHYSICAL EXAMINATION: ECOG PERFORMANCE STATUS: 1 - Symptomatic but completely ambulatory  Vitals:   07/22/20 1326 07/22/20 1335  BP: 125/76 125/76  Pulse: 86 86  Resp: 18 18  Temp: (!) 96.8 F (36 C) (!) 96.8 F (36 C)  SpO2: 96% 96%   Filed Weights   07/22/20 1326 07/22/20 1335  Weight: 157 lb 3.2 oz (71.3 kg) 157 lb 3.2 oz (71.3 kg)   Physical Exam Vitals reviewed.  Constitutional:      Appearance: Normal appearance.  Cardiovascular:     Rate and Rhythm: Normal rate and regular rhythm.     Pulses: Normal pulses.     Heart sounds: Normal heart sounds.  Pulmonary:     Effort: Pulmonary effort is normal.     Breath sounds: Normal breath sounds.  Chest:  Breasts:     Right: No axillary adenopathy or supraclavicular adenopathy.     Left: No axillary adenopathy or supraclavicular adenopathy.    Abdominal:     Palpations: Abdomen is soft. There is no hepatomegaly, splenomegaly or mass.     Tenderness: There is abdominal tenderness in the  left upper quadrant.     Hernia: No hernia is present.  Musculoskeletal:     Right lower leg: No edema.     Left lower leg: No edema.  Lymphadenopathy:     Cervical: No cervical adenopathy.     Right cervical: No superficial cervical adenopathy.    Left cervical: No superficial cervical adenopathy.     Upper Body:     Right upper body: No supraclavicular, axillary or pectoral adenopathy.     Left upper body: No supraclavicular, axillary or pectoral adenopathy.     Lower Body: No right inguinal adenopathy. No left inguinal adenopathy.  Neurological:     General: No focal deficit present.     Mental Status: He is alert and oriented to  person, place, and time.  Psychiatric:        Mood and Affect: Mood normal.        Behavior: Behavior normal.      LABORATORY DATA:   I have reviewed the data as listed CBC Latest Ref Rng & Units 07/15/2020  WBC 3.4 - 10.8 x10E3/uL 8.1  Hemoglobin 13.0 - 17.7 g/dL 13.9  Hematocrit 37.5 - 51.0 % 42.5  Platelets 150 - 450 x10E3/uL 330   CMP Latest Ref Rng & Units 07/15/2020 07/10/2020  Glucose 65 - 99 mg/dL 94 -  BUN 8 - 27 mg/dL 10 -  Creatinine 0.76 - 1.27 mg/dL 1.13 1.00  Sodium 134 - 144 mmol/L 140 -  Potassium 3.5 - 5.2 mmol/L 4.9 -  Chloride 96 - 106 mmol/L 102 -  CO2 20 - 29 mmol/L 22 -  Calcium 8.6 - 10.2 mg/dL 9.7 -  Total Protein 6.0 - 8.5 g/dL 7.5 -  Total Bilirubin 0.0 - 1.2 mg/dL 0.5 -  Alkaline Phos 44 - 121 IU/L 68 -  AST 0 - 40 IU/L 16 -  ALT 0 - 44 IU/L 10 -    RADIOGRAPHIC STUDIES: I have personally reviewed the radiological images as listed and agreed with the findings in the report. CT CHEST W CONTRAST  Result Date: 07/21/2020 CLINICAL DATA:  Left renal mass with hepatic metastasis EXAM: CT CHEST WITH CONTRAST TECHNIQUE: Multidetector CT imaging of the chest was performed during intravenous contrast administration. CONTRAST:  51mL OMNIPAQUE IOHEXOL 300 MG/ML  SOLN COMPARISON:  None. FINDINGS: Cardiovascular: Heart is normal  in size.  No pericardial effusion. No evidence of thoracic aortic aneurysm. Atherosclerotic calcifications of the aortic arch. Mild coronary atherosclerosis of the LAD. Mediastinum/Nodes: No suspicious mediastinal adenopathy. Visualized thyroid is unremarkable. Lungs/Pleura: Evaluation of the lung parenchyma is constrained by respiratory motion. Within that constraint, there are no suspicious pulmonary nodules. No focal consolidation. No pleural effusion or pneumothorax. Upper Abdomen: Evaluated on dedicated MRI abdomen. Musculoskeletal: Old left 3rd through 6th rib fracture deformities. Degenerative changes of the mid/lower thoracic spine. IMPRESSION: No evidence of metastatic disease in the chest. Aortic Atherosclerosis (ICD10-I70.0). Electronically Signed   By: Julian Hy M.D.   On: 07/21/2020 08:41   CT Abdomen Pelvis W Contrast  Result Date: 07/13/2020 CLINICAL DATA:  Generalized abdominal pain with unintentional weight loss. Previous appendectomy. EXAM: CT ABDOMEN AND PELVIS WITH CONTRAST TECHNIQUE: Multidetector CT imaging of the abdomen and pelvis was performed using the standard protocol following bolus administration of intravenous contrast. CONTRAST:  17mL OMNIPAQUE IOHEXOL 300 MG/ML  SOLN COMPARISON:  None. FINDINGS: Lower chest: Clear lung bases. No significant pleural or pericardial effusion. Hepatobiliary: There is an ill-defined low-density mass centrally in the right hepatic lobe adjacent to the IVC, measuring 2.0 x 2.0 cm on image 16/2. There is a possible adjacent low-density lesion measuring 9 mm on image 16/2. No evidence of gallstones, gallbladder wall thickening or biliary dilatation. Pancreas: Unremarkable. No pancreatic ductal dilatation or surrounding inflammatory changes. Spleen: Normal in size without focal abnormality. Adrenals/Urinary Tract: There is a large infiltrative mass involving the upper pole of the left kidney, measuring approximately 7.6 x 5.9 cm. This demonstrates  heterogeneous low density and no significant hypervascular components. There is encasement and narrowing of the left renal artery and vein. No intravascular thrombus identified. This mass encases the left adrenal gland which is not seen separately. There is a small right adrenal nodule measuring 11 mm on image 21/2, likely benign. The right kidney  appears unremarkable. There is a tiny nonobstructing left renal calculus. No evidence of ureteral calculus or obstruction. The bladder appears unremarkable. Stomach/Bowel: No enteric contrast administered. The stomach appears unremarkable for its degree of distension. No evidence of bowel wall thickening, distention or surrounding inflammatory change. Vascular/Lymphatic: There are no enlarged abdominal or pelvic lymph nodes. As above, there is left renal vascular encasement by the infiltrative mass involving the upper pole of the left kidney. No evidence of tumor thrombus. There is aortic and branch vessel atherosclerosis without acute vascular findings. The portal, superior mesenteric and splenic veins are patent. Reproductive: Asymmetric dystrophic calcifications within the right aspect of the prostate gland. Other: No evidence of abdominal wall mass or hernia. No ascites. Musculoskeletal: No acute or significant osseous findings. Mild lumbar spondylosis. IMPRESSION: 1. Large infiltrative mass involving the upper pole of the left kidney, engulfing the left adrenal gland and causing left renal vascular encasement. This is highly suspicious for malignancy, although atypical for renal cell carcinoma. Consider lymphoma or transitional cell carcinoma. 2. Low-density mass centrally in the right hepatic lobe suspicious for metastatic disease. 3. No other evidence of metastatic disease. Recommend urology consultation. Abdominal MRI may be helpful for further staging. 4.  Aortic Atherosclerosis (ICD10-I70.0). 5. These results will be called to the ordering clinician or  representative by the Radiologist Assistant, and communication documented in the PACS or [employer]. Electronically Signed   By: Richardean Sale M.D.   On: 07/13/2020 09:40   MR LIVER W WO CONTRAST  Result Date: 07/21/2020 CLINICAL DATA:  Renal mass with suspected hepatic metastasis on CT EXAM: MRI ABDOMEN WITHOUT AND WITH CONTRAST TECHNIQUE: Multiplanar multisequence MR imaging of the abdomen was performed both before and after the administration of intravenous contrast. CONTRAST:  59mL GADAVIST GADOBUTROL 1 MMOL/ML IV SOLN COMPARISON:  CT abdomen/pelvis dated 07/10/2020 FINDINGS: Lower chest: Evaluated on dedicated CT chest. Hepatobiliary: 2.2 x 1.7 cm enhancing lesion in the medial aspect of segment 7 (series 4/image 9), suspicious for metastasis. Gallbladder is unremarkable. No intrahepatic or extrahepatic ductal dilatation. Pancreas:  Within normal limits. Spleen:  Within normal limits. Adrenals/Urinary Tract: Right adrenal gland is within normal limits. Left adrenal gland is obscured/involved by the left renal mass. 6.0 x 7.6 x 6.9 cm infiltrating enhancing left upper pole renal mass, suspicious for renal neoplasm, extending into the left renal sinus. This unusual appearance raises the possibility of transitional cell carcinoma or possibly lymphoma in addition to renal cell carcinoma. Lesion extends into the perinephric fat (series 17/image 37). Right kidney is within normal limits.  No hydronephrosis. Stomach/Bowel: Stomach is within normal limits. Visualized bowel is unremarkable. Vascular/Lymphatic:  No evidence of abdominal aortic aneurysm. While there is no definite left renal vein invasion, the lesion encases the left renal artery. No discrete retroperitoneal lymphadenopathy. Other:  No abdominal ascites. Musculoskeletal: No focal osseous lesions. IMPRESSION: 7.6 cm infiltrating left upper pole renal mass, suspicious for renal neoplasm, although possibly reflecting transitional cell carcinoma or  lymphoma rather than renal cell carcinoma. Involvement of the left adrenal gland. Encasement of the left renal artery without left renal vein invasion. Extension into the perinephric fat. 2.2 cm metastasis in segment 7 of the liver. Electronically Signed   By: Julian Hy M.D.   On: 07/21/2020 08:39    ASSESSMENT:  1.  Possible renal neoplasm with metastases to liver: - Patient evaluated at the request of Roseanne Kaufman for evaluation of left kidney and liver masses. - 20 pound weight loss in the last  4 months due to decreased appetite.  Left upper quadrant pain for the past 2 to 3 months. - CTAP with contrast on 07/10/2020 showed ill-defined low-density mass in the right hepatic lobe measuring 2 x 2 cm.  Left upper pole kidney mass measuring 7.6 x 5.9 cm with encasement and narrowing of the left renal artery and vein.  No intravascular thrombus noted.  Mass encases the left adrenal gland.  Small right adrenal nodule measuring 11 mm, likely benign.  No other evidence of metastatic disease. - MRI of the liver with and without contrast on 07/20/2020 shows 2.2 x 1.7 cm enhancing lesion in the medial aspect of the segment 7, suspicious for metastasis.  Left renal mass, 6 x 7.6 x 6.9 cm infiltrating enhancing left upper pole renal mass, extending into the left renal sinus.  Possible TCC or lymphoma rather than renal cell carcinoma. - CT chest on 07/20/2020 with no evidence of metastatic disease.  2.  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 - Reports exposure to carbon black dust.  Smoked half pack per day for 30 years, currently smoking 2 to 3 cigarettes/day. - No family history of malignancy.    PLAN:  1.  Possible renal neoplasm with metastases to liver: - We have reviewed images of the CT scan and MRI.  We discussed the differential diagnosis in detail. - Recommend PET CT scan and biopsy of the liver lesion. - RTC after the above.  We will also  consider urology consultation at that time.  2.  Left upper quadrant pain: - Reports occasional sharp pains for the past 2 to 3 months.  Pain comes and goes.  Currently taking Percocet 5 mg 1 to 2 tablets/day. - We will refill his pain medication.   All questions were answered. The patient knows to call the clinic with any problems, questions or concerns.   Derek Jack, MD, 07/22/20 2:16 PM  [place] [phone]   I, Milinda Antis, am acting as a scribe for Dr. Sanda Linger.  I, Derek Jack MD, have reviewed the above documentation for accuracy and completeness, and I agree with the above.

## 2020-07-22 NOTE — Patient Instructions (Signed)
[place] at [facility] Discharge Instructions  You were seen and examined today by Dr. Delton Coombes. Dr. Delton Coombes is a medical oncologist, meaning he specializes in the management of cancer diagnoses with medications. Dr. Delton Coombes discussed your past medical history, family history of cancer and the events that led to you being here today.  On your recent CT and MRI scans, there was a mass noted on your left kidney and a small mass also noted in your liver. This is concerning for cancer. Dr. Delton Coombes has recommended a biopsy to accurately identify if it is truly cancer and the type of cancer. Dr. Delton Coombes will arrange for a PET scan. This will illuminate where there is cancer present in your body.  Dr. Delton Coombes will see you following the PET scan.   Thank you for choosing [place] at [facility] to provide your oncology and hematology care.  To afford each patient quality time with our provider, please arrive at least 15 minutes before your scheduled appointment time.   If you have a lab appointment with the [facility] please come in thru the Main Entrance and check in at the main information desk.  You need to re-schedule your appointment should you arrive 10 or more minutes late.  We strive to give you quality time with our providers, and arriving late affects you and other patients whose appointments are after yours.  Also, if you no show three or more times for appointments you may be dismissed from the clinic at the providers discretion.     Again, thank you for choosing [facility].  Our hope is that these requests will decrease the amount of time that you wait before being seen by our physicians.       _____________________________________________________________  Should you have questions after your visit to [facility], please contact our office at [phone] and follow the prompts.  Our office  hours are 8:00 a.m. and 4:30 p.m. Monday - Friday.  Please note that voicemails left after 4:00 p.m. may not be returned until the following business day.  We are closed weekends and major holidays.  You do have access to a nurse 24-7, just call the main number to the clinic [phone] and do not press any options, hold on the line and a nurse will answer the phone.    For prescription refill requests, have your pharmacy contact our office and allow 72 hours.    Due to Covid, you will need to wear a mask upon entering the hospital. If you do not have a mask, a mask will be given to you at the Main Entrance upon arrival. For doctor visits, patients may have 1 support person age 48 or older with them. For treatment visits, patients can not have anyone with them due to social distancing guidelines and our immunocompromised population.

## 2020-07-23 ENCOUNTER — Encounter (HOSPITAL_COMMUNITY): Payer: Self-pay

## 2020-07-23 ENCOUNTER — Telehealth: Payer: Self-pay | Admitting: Gastroenterology

## 2020-07-23 NOTE — Telephone Encounter (Signed)
Pt's daughter would like for Roseanne Kaufman, NP to call her regarding patient. [phone]

## 2020-07-23 NOTE — Progress Notes (Signed)
I met with the patient and his daughter, [name] during the patient's initial visit with Dr. Delton Coombes. I introduced myself and explained my role in the patient's care. I provided my contact information and encouraged the patient to call with questions or concerns.

## 2020-07-28 ENCOUNTER — Encounter (HOSPITAL_COMMUNITY): Payer: Self-pay

## 2020-07-28 NOTE — Progress Notes (Signed)
           Jeremy Rodriguez Male, 71 y.o., 06-Feb-1951  MRN:  [id number] Phone:  [phone] Jerilynn Mages)       PCP:  Lindell Spar, MD Coverage:  [facility] ([facility] PET) 08/04/2020 at 11:00 AM           RE: Biopsy Received: 4 days ago  Message Details  Arne Cleveland, MD  Lenore Cordia Ok   CT core liver lesion   [facility]    Previous Messages  ----- Message -----  From: Lenore Cordia  Sent: 07/24/2020  2:18 PM EDT  To: [facility] Procedure Requests  Subject: Biopsy                      Procedure Requested: CT Biopsy    Reason for Procedure:possible RCC, masses noted in L kidney and Liver    Provider Requesting: Dr Delton Coombes  Provider Telephone: [phone]    Dr's Note:  Please biopsy liver biopsy if possible, if unable to do the liver biopsy, please proceed with kidney biopsy.    Future appointment: [facility] Pet 08/04/2020

## 2020-07-29 NOTE — Telephone Encounter (Signed)
Spoke with daughter again. Answered questions best I could. I informed her we would not have clear answers until following biopsy and PET scan.

## 2020-08-04 ENCOUNTER — Ambulatory Visit (HOSPITAL_COMMUNITY)
Admission: RE | Admit: 2020-08-04 | Discharge: 2020-08-04 | Disposition: A | Discharge status: Home / Self Care | Payer: Medicare Other | Source: Ambulatory Visit | Attending: Hematology | Admitting: Hematology

## 2020-08-04 ENCOUNTER — Other Ambulatory Visit: Payer: Self-pay

## 2020-08-04 DIAGNOSIS — C642 Malignant neoplasm of left kidney, except renal pelvis: Secondary | ICD-10-CM | POA: Diagnosis not present

## 2020-08-04 DIAGNOSIS — I700 Atherosclerosis of aorta: Secondary | ICD-10-CM | POA: Insufficient documentation

## 2020-08-04 DIAGNOSIS — C652 Malignant neoplasm of left renal pelvis: Secondary | ICD-10-CM | POA: Insufficient documentation

## 2020-08-04 DIAGNOSIS — I517 Cardiomegaly: Secondary | ICD-10-CM | POA: Diagnosis not present

## 2020-08-04 LAB — GLUCOSE, CAPILLARY: Glucose-Capillary: 101 mg/dL — ABNORMAL HIGH (ref 70–99)

## 2020-08-04 MED ORDER — FLUDEOXYGLUCOSE F - 18 (FDG) INJECTION
8.1000 | Freq: Once | INTRAVENOUS | Status: AC | PRN
  Administered 2020-08-04: 7.87 via INTRAVENOUS

## 2020-08-06 ENCOUNTER — Other Ambulatory Visit: Payer: Self-pay | Admitting: Radiology

## 2020-08-06 ENCOUNTER — Encounter (HOSPITAL_COMMUNITY): Payer: Self-pay

## 2020-08-07 ENCOUNTER — Other Ambulatory Visit: Payer: Self-pay

## 2020-08-07 ENCOUNTER — Ambulatory Visit (HOSPITAL_COMMUNITY)
Admission: RE | Admit: 2020-08-07 | Discharge: 2020-08-07 | Disposition: A | Discharge status: Home / Self Care | Payer: Medicare Other | Source: Ambulatory Visit | Attending: Hematology | Admitting: Hematology

## 2020-08-07 DIAGNOSIS — N2889 Other specified disorders of kidney and ureter: Secondary | ICD-10-CM | POA: Diagnosis not present

## 2020-08-07 DIAGNOSIS — C652 Malignant neoplasm of left renal pelvis: Secondary | ICD-10-CM | POA: Diagnosis not present

## 2020-08-07 DIAGNOSIS — K769 Liver disease, unspecified: Secondary | ICD-10-CM | POA: Insufficient documentation

## 2020-08-07 DIAGNOSIS — C229 Malignant neoplasm of liver, not specified as primary or secondary: Secondary | ICD-10-CM | POA: Diagnosis not present

## 2020-08-07 DIAGNOSIS — C649 Malignant neoplasm of unspecified kidney, except renal pelvis: Secondary | ICD-10-CM | POA: Diagnosis not present

## 2020-08-07 DIAGNOSIS — K7689 Other specified diseases of liver: Secondary | ICD-10-CM | POA: Diagnosis not present

## 2020-08-07 LAB — CBC
HCT: 39.8 % (ref 39.0–52.0)
Hemoglobin: 13.1 g/dL (ref 13.0–17.0)
MCH: 28 pg (ref 26.0–34.0)
MCHC: 32.9 g/dL (ref 30.0–36.0)
MCV: 85 fL (ref 80.0–100.0)
Platelets: 302 10*3/uL (ref 150–400)
RBC: 4.68 MIL/uL (ref 4.22–5.81)
RDW: 14.1 % (ref 11.5–15.5)
WBC: 7.6 10*3/uL (ref 4.0–10.5)
nRBC: 0 % (ref 0.0–0.2)

## 2020-08-07 LAB — PROTIME-INR
INR: 1 (ref 0.8–1.2)
Prothrombin Time: 13.4 seconds (ref 11.4–15.2)

## 2020-08-07 MED ORDER — SODIUM CHLORIDE 0.9 % IV SOLN: INTRAVENOUS | Status: DC

## 2020-08-07 MED ORDER — LIDOCAINE-EPINEPHRINE 1 %-1:100000 IJ SOLN
INTRAMUSCULAR | Status: AC
  Filled 2020-08-07: qty 1

## 2020-08-07 MED ORDER — MIDAZOLAM HCL 2 MG/2ML IJ SOLN
INTRAMUSCULAR | Status: AC
  Filled 2020-08-07: qty 2

## 2020-08-07 MED ORDER — FENTANYL CITRATE (PF) 100 MCG/2ML IJ SOLN
INTRAMUSCULAR | Status: AC | PRN
  Administered 2020-08-07: 50 ug via INTRAVENOUS

## 2020-08-07 MED ORDER — FENTANYL CITRATE (PF) 100 MCG/2ML IJ SOLN
INTRAMUSCULAR | Status: AC
  Filled 2020-08-07: qty 2

## 2020-08-07 MED ORDER — GELATIN ABSORBABLE 12-7 MM EX MISC
CUTANEOUS | Status: AC
  Filled 2020-08-07: qty 1

## 2020-08-07 MED ORDER — MIDAZOLAM HCL 2 MG/2ML IJ SOLN
INTRAMUSCULAR | Status: AC | PRN
  Administered 2020-08-07: 1 mg via INTRAVENOUS

## 2020-08-07 NOTE — Discharge Instructions (Addendum)
Liver Biopsy, Care After These instructions give you information on caring for yourself after your procedure. Your doctor may also give you more specific instructions. Call your doctor if you have any problems or questions after your procedure. What can I expect after the procedure? After the procedure, it is common to have:  Pain and soreness where the biopsy was done.  Bruising around the area where the biopsy was done.  Sleepiness and be tired for a few days. Follow these instructions at home: Medicines  Take over-the-counter and prescription medicines only as told by your doctor.  If you were prescribed an antibiotic medicine, take it as told by your doctor. Do not stop taking the antibiotic even if you start to feel better.  Do not take medicines such as aspirin and ibuprofen. These medicines can thin your blood. Do not take these medicines unless your doctor tells you to take them.  If you are taking prescription pain medicine, take actions to prevent or treat constipation. Your doctor may recommend that you: ? Drink enough fluid to keep your pee (urine) clear or pale yellow. ? Take over-the-counter or prescription medicines. ? Eat foods that are high in fiber, such as fresh fruits and vegetables, whole grains, and beans. ? Limit foods that are high in fat and processed sugars, such as fried and sweet foods. Caring for your cut  Follow instructions from your doctor about how to take care of your cuts from surgery (incisions). Make sure you: ? Wash your hands with soap and water before you change your bandage (dressing). If you cannot use soap and water, use hand sanitizer. ? Change your bandage as told by your doctor. ? Leave stitches (sutures), skin glue, or skin tape (adhesive) strips in place. They may need to stay in place for 2 weeks or longer. If tape strips get loose and curl up, you may trim the loose edges. Do not remove tape strips completely unless your doctor says it is  okay.  Check your cuts every day for signs of infection. Check for: ? Redness, swelling, or more pain. ? Fluid or blood. ? Pus or a bad smell. ? Warmth.  Do not take baths, swim, or use a hot tub until your doctor says it is okay to do so. Activity  Rest at home for 1-2 days or as told by your doctor. ? Avoid sitting for a long time without moving. Get up to take short walks every 1-2 hours.  Return to your normal activities as told by your doctor. Ask what activities are safe for you.  Do not do these things in the first 24 hours: ? Drive. ? Use machinery. ? Take a bath or shower.  Do not lift more than 10 pounds (4.5 kg) or play contact sports for the first 2 weeks.   General instructions  Do not drink alcohol in the first week after the procedure.  Have someone stay with you for at least 24 hours after the procedure.  Get your test results. Ask your doctor or the department that is doing the test: ? When will my results be ready? ? How will I get my results? ? What are my treatment options? ? What other tests do I need? ? What are my next steps?  Keep all follow-up visits as told by your doctor. This is important.   Contact a doctor if:  A cut bleeds and leaves more than just a small spot of blood.  A cut is red,  puffs up (swells), or hurts more than before.  Fluid or something else comes from a cut.  A cut smells bad.  You have a fever or chills. Get help right away if:  You have swelling, bloating, or pain in your belly (abdomen).  You get dizzy or faint.  You have a rash.  You feel sick to your stomach (nauseous) or throw up (vomit).  You have trouble breathing, feel short of breath, or feel faint.  Your chest hurts.  You have problems talking or seeing.  You have trouble with your balance or moving your arms or legs. Summary  After the procedure, it is common to have pain, soreness, bruising, and tiredness.  Your doctor will tell you how to  take care of yourself at home. Change your bandage, take your medicines, and limit your activities as told by your doctor.  Call your doctor if you have symptoms of infection. Get help right away if your belly swells, your cut bleeds a lot, or you have trouble talking or breathing. This information is not intended to replace advice given to you by your health care provider. Make sure you discuss any questions you have with your health care provider. Document Revised: 03/30/2017 Document Reviewed: 03/31/2017 [employer] Patient Education  2021 [employer] Moderate Conscious Sedation, Adult, Care After This sheet gives you information about how to care for yourself after your procedure. Your health care provider may also give you more specific instructions. If you have problems or questions, contact your health care provider. What can I expect after the procedure? After the procedure, it is common to have:  Sleepiness for several hours.  Impaired judgment for several hours.  Difficulty with balance.  Vomiting if you eat too soon. Follow these instructions at home: For the time period you were told by your health care provider:  Rest.  Do not participate in activities where you could fall or become injured.  Do not drive or use machinery.  Do not drink alcohol.  Do not take sleeping pills or medicines that cause drowsiness.  Do not make important decisions or sign legal documents.  Do not take care of children on your own.      Eating and drinking  Follow the diet recommended by your health care provider.  Drink enough fluid to keep your urine pale yellow.  If you vomit: ? Drink water, juice, or soup when you can drink without vomiting. ? Make sure you have little or no nausea before eating solid foods.   General instructions  Take over-the-counter and prescription medicines only as told by your health care provider.  Have a responsible adult stay with you for the time  you are told. It is important to have someone help care for you until you are awake and alert.  Do not smoke.  Keep all follow-up visits as told by your health care provider. This is important. Contact a health care provider if:  You are still sleepy or having trouble with balance after 24 hours.  You feel light-headed.  You keep feeling nauseous or you keep vomiting.  You develop a rash.  You have a fever.  You have redness or swelling around the IV site. Get help right away if:  You have trouble breathing.  You have new-onset confusion at home. Summary  After the procedure, it is common to feel sleepy, have impaired judgment, or feel nauseous if you eat too soon.  Rest after you get home. Know the things   you should not do after the procedure.  Follow the diet recommended by your health care provider and drink enough fluid to keep your urine pale yellow.  Get help right away if you have trouble breathing or new-onset confusion at home. This information is not intended to replace advice given to you by your health care provider. Make sure you discuss any questions you have with your health care provider. Document Revised: 07/19/2019 Document Reviewed: 02/14/2019 [employer] Patient Education  2021 [employer]   

## 2020-08-07 NOTE — Procedures (Signed)
Pre Procedure Dx: Liver lesion worrisome for metastatic RCC Post Procedural Dx: Same  Technically successful US guided biopsy of indeterminate lesion within the caudate lobe.  EBL: None  No immediate complications.   Ronny Bacon, MD Pager #: [phone]

## 2020-08-07 NOTE — H&P (Signed)
Chief Complaint: Patient was seen in consultation today for liver lesion/biopsy.  Referring Physician(s): Firefighter (oncology)  Supervising Physician: Ruthann Cancer  Patient Status: [facility] - Out-pt  History of Present Illness: Jeremy Rodriguez is a 70 y.o. male with a past medical history of hypertension, GERD, CKD, and left renal mass. He has been having intermittent left abdominal pain/discomfort and underwent CT abdomen/pelvis which revealed left renal mass and liver lesions concerning for metastatic disease. He was then referred to oncology for further management who recommended IR liver lesion biopsy for tissue diagnosis.  NM PET 08/04/2020: 1. Large hypermetabolic left kidney upper pole mass likely involving and obscuring the adrenal gland. Two hypermetabolic lesions in the right hepatic lobe favoring metastatic disease. No hypermetabolic adenopathy identified. 2. Other imaging findings of potential clinical significance: Aortic Atherosclerosis (ICD10-I70.0). Mild cardiomegaly. Degenerative activity in the right sternoclavicular joint.  MR liver 07/20/2020: 1. 7.6 cm infiltrating left upper pole renal mass, suspicious for renal neoplasm, although possibly reflecting transitional cell carcinoma or lymphoma rather than renal cell carcinoma. 2. Involvement of the left adrenal gland. Encasement of the left renal artery without left renal vein invasion. Extension into the perinephric fat. 3. 2.2 cm metastasis in segment 7 of the liver.  CT abdomen/pelvis 07/10/2020: 1. Large infiltrative mass involving the upper pole of the left kidney, engulfing the left adrenal gland and causing left renal vascular encasement. This is highly suspicious for malignancy, although atypical for renal cell carcinoma. Consider lymphoma or transitional cell carcinoma. 2. Low-density mass centrally in the right hepatic lobe suspicious for metastatic disease. 3. No other evidence of metastatic disease.  Recommend urology consultation. Abdominal MRI may be helpful for further staging. 4.  Aortic Atherosclerosis (ICD10-I70.0).  IR consulted by Dr. Delton Coombes for possible image-guided liver lesion biopsy. Patient awake and alert sitting in bed. Complains of intermittent left-sided abdominal pain. States pain is "nagging" but "not severe". Denies N/V associated with abdominal pain. Denies fever, chills, chest pain, dyspnea, or headache.   Past Medical History:  Diagnosis Date  . Chronic kidney disease   . GERD (gastroesophageal reflux disease)   . Hypertension   . Medical history non-contributory     Past Surgical History:  Procedure Laterality Date  . appendectomy      Allergies: Patient has no known allergies.  Medications: Prior to Admission medications   Medication Sig Start Date End Date Taking? Authorizing Provider  amLODipine (NORVASC) 5 MG tablet Take 1 tablet (5 mg total) by mouth daily. 07/15/20   Lindell Spar, MD  docusate sodium (COLACE) 100 MG capsule Take 1 capsule (100 mg total) by mouth daily as needed for mild constipation. 06/24/20   Lindell Spar, MD  linaclotide [facility]) 145 MCG CAPS capsule Take 1 capsule (145 mcg total) by mouth daily before breakfast. 07/07/20   Annitta Needs, NP  oxyCODONE-acetaminophen (PERCOCET/ROXICET) 5-325 MG tablet Take 1 tablet by mouth every 12 (twelve) hours as needed for severe pain. 07/22/20   Derek Jack, MD  pantoprazole (PROTONIX) 40 MG tablet Take 1 tablet (40 mg total) by mouth daily. 06/24/20   Lindell Spar, MD     Family History  Problem Relation Age of Onset  . Colon cancer Neg Hx   . Colon polyps Neg Hx   . Liver cancer Neg Hx   . Pancreatic cancer Neg Hx     Social History   Socioeconomic History  . Marital status: Single    Spouse name: Not on file  . Number  of children: Not on file  . Years of education: Not on file  . Highest education level: Not on file  Occupational History  . Not on file   Tobacco Use  . Smoking status: Current Some Day Smoker  . Smokeless tobacco: Never Used  . Tobacco comment: a few cigarettes a day  Substance and Sexual Activity  . Alcohol use: Yes    Comment: occ  . Drug use: Never  . Sexual activity: Not on file  Other Topics Concern  . Not on file  Social History Narrative  . Not on file   Social Determinants of Health   Financial Resource Strain: Low Risk   . Difficulty of Paying Living Expenses: Not very hard  Food Insecurity: No Food Insecurity  . Worried About Charity fundraiser in the Last Year: Never true  . Ran Out of Food in the Last Year: Never true  Transportation Needs: No Transportation Needs  . Lack of Transportation (Medical): No  . Lack of Transportation (Non-Medical): No  Physical Activity: Inactive  . Days of Exercise per Week: 0 days  . Minutes of Exercise per Session: 0 min  Stress: No Stress Concern Present  . Feeling of Stress : Not at all  Social Connections: Unknown  . Frequency of Communication with Friends and Family: Twice a week  . Frequency of Social Gatherings with Friends and Family: Twice a week  . Attends Religious Services: Not on file  . Active Member of Clubs or Organizations: Not on file  . Attends Archivist Meetings: Not on file  . Marital Status: Divorced     Review of Systems: A 12 point ROS discussed and pertinent positives are indicated in the HPI above.  All other systems are negative.  Review of Systems  Constitutional: Negative for chills and fever.  Respiratory: Negative for shortness of breath and wheezing.   Cardiovascular: Negative for chest pain and palpitations.  Gastrointestinal: Positive for abdominal pain. Negative for nausea and vomiting.  Neurological: Negative for headaches.  Psychiatric/Behavioral: Negative for behavioral problems and confusion.    Vital Signs: BP (!) 175/78   Pulse 74   Temp 97.8 F (36.6 C) (Oral)   Resp 18   Ht 5\' 6"  (1.676 m)   Wt  152 lb (68.9 kg)   SpO2 100%   BMI 24.53 kg/m   Physical Exam Vitals and nursing note reviewed.  Constitutional:      General: He is not in acute distress.    Appearance: Normal appearance.  Cardiovascular:     Rate and Rhythm: Normal rate and regular rhythm.     Heart sounds: Normal heart sounds. No murmur heard.   Pulmonary:     Effort: Pulmonary effort is normal. No respiratory distress.     Breath sounds: Normal breath sounds. No wheezing.  Abdominal:     Palpations: Abdomen is soft.     Tenderness: There is no abdominal tenderness.  Skin:    General: Skin is warm and dry.  Neurological:     Mental Status: He is alert and oriented to person, place, and time.      MD Evaluation Airway: WNL Heart: WNL Abdomen: WNL Chest/ Lungs: WNL ASA  Classification: 3 Mallampati/Airway Score: Two   Imaging: CT CHEST W CONTRAST  Result Date: 07/21/2020 CLINICAL DATA:  Left renal mass with hepatic metastasis EXAM: CT CHEST WITH CONTRAST TECHNIQUE: Multidetector CT imaging of the chest was performed during intravenous contrast administration. CONTRAST:  60mL OMNIPAQUE  IOHEXOL 300 MG/ML  SOLN COMPARISON:  None. FINDINGS: Cardiovascular: Heart is normal in size.  No pericardial effusion. No evidence of thoracic aortic aneurysm. Atherosclerotic calcifications of the aortic arch. Mild coronary atherosclerosis of the LAD. Mediastinum/Nodes: No suspicious mediastinal adenopathy. Visualized thyroid is unremarkable. Lungs/Pleura: Evaluation of the lung parenchyma is constrained by respiratory motion. Within that constraint, there are no suspicious pulmonary nodules. No focal consolidation. No pleural effusion or pneumothorax. Upper Abdomen: Evaluated on dedicated MRI abdomen. Musculoskeletal: Old left 3rd through 6th rib fracture deformities. Degenerative changes of the mid/lower thoracic spine. IMPRESSION: No evidence of metastatic disease in the chest. Aortic Atherosclerosis (ICD10-I70.0).  Electronically Signed   By: Julian Hy M.D.   On: 07/21/2020 08:41   CT Abdomen Pelvis W Contrast  Result Date: 07/13/2020 CLINICAL DATA:  Generalized abdominal pain with unintentional weight loss. Previous appendectomy. EXAM: CT ABDOMEN AND PELVIS WITH CONTRAST TECHNIQUE: Multidetector CT imaging of the abdomen and pelvis was performed using the standard protocol following bolus administration of intravenous contrast. CONTRAST:  168mL OMNIPAQUE IOHEXOL 300 MG/ML  SOLN COMPARISON:  None. FINDINGS: Lower chest: Clear lung bases. No significant pleural or pericardial effusion. Hepatobiliary: There is an ill-defined low-density mass centrally in the right hepatic lobe adjacent to the IVC, measuring 2.0 x 2.0 cm on image 16/2. There is a possible adjacent low-density lesion measuring 9 mm on image 16/2. No evidence of gallstones, gallbladder wall thickening or biliary dilatation. Pancreas: Unremarkable. No pancreatic ductal dilatation or surrounding inflammatory changes. Spleen: Normal in size without focal abnormality. Adrenals/Urinary Tract: There is a large infiltrative mass involving the upper pole of the left kidney, measuring approximately 7.6 x 5.9 cm. This demonstrates heterogeneous low density and no significant hypervascular components. There is encasement and narrowing of the left renal artery and vein. No intravascular thrombus identified. This mass encases the left adrenal gland which is not seen separately. There is a small right adrenal nodule measuring 11 mm on image 21/2, likely benign. The right kidney appears unremarkable. There is a tiny nonobstructing left renal calculus. No evidence of ureteral calculus or obstruction. The bladder appears unremarkable. Stomach/Bowel: No enteric contrast administered. The stomach appears unremarkable for its degree of distension. No evidence of bowel wall thickening, distention or surrounding inflammatory change. Vascular/Lymphatic: There are no enlarged  abdominal or pelvic lymph nodes. As above, there is left renal vascular encasement by the infiltrative mass involving the upper pole of the left kidney. No evidence of tumor thrombus. There is aortic and branch vessel atherosclerosis without acute vascular findings. The portal, superior mesenteric and splenic veins are patent. Reproductive: Asymmetric dystrophic calcifications within the right aspect of the prostate gland. Other: No evidence of abdominal wall mass or hernia. No ascites. Musculoskeletal: No acute or significant osseous findings. Mild lumbar spondylosis. IMPRESSION: 1. Large infiltrative mass involving the upper pole of the left kidney, engulfing the left adrenal gland and causing left renal vascular encasement. This is highly suspicious for malignancy, although atypical for renal cell carcinoma. Consider lymphoma or transitional cell carcinoma. 2. Low-density mass centrally in the right hepatic lobe suspicious for metastatic disease. 3. No other evidence of metastatic disease. Recommend urology consultation. Abdominal MRI may be helpful for further staging. 4.  Aortic Atherosclerosis (ICD10-I70.0). 5. These results will be called to the ordering clinician or representative by the Radiologist Assistant, and communication documented in the PACS or [employer]. Electronically Signed   By: Richardean Sale M.D.   On: 07/13/2020 09:40   MR LIVER W WO  CONTRAST  Result Date: 07/21/2020 CLINICAL DATA:  Renal mass with suspected hepatic metastasis on CT EXAM: MRI ABDOMEN WITHOUT AND WITH CONTRAST TECHNIQUE: Multiplanar multisequence MR imaging of the abdomen was performed both before and after the administration of intravenous contrast. CONTRAST:  65mL GADAVIST GADOBUTROL 1 MMOL/ML IV SOLN COMPARISON:  CT abdomen/pelvis dated 07/10/2020 FINDINGS: Lower chest: Evaluated on dedicated CT chest. Hepatobiliary: 2.2 x 1.7 cm enhancing lesion in the medial aspect of segment 7 (series 4/image 9), suspicious  for metastasis. Gallbladder is unremarkable. No intrahepatic or extrahepatic ductal dilatation. Pancreas:  Within normal limits. Spleen:  Within normal limits. Adrenals/Urinary Tract: Right adrenal gland is within normal limits. Left adrenal gland is obscured/involved by the left renal mass. 6.0 x 7.6 x 6.9 cm infiltrating enhancing left upper pole renal mass, suspicious for renal neoplasm, extending into the left renal sinus. This unusual appearance raises the possibility of transitional cell carcinoma or possibly lymphoma in addition to renal cell carcinoma. Lesion extends into the perinephric fat (series 17/image 37). Right kidney is within normal limits.  No hydronephrosis. Stomach/Bowel: Stomach is within normal limits. Visualized bowel is unremarkable. Vascular/Lymphatic:  No evidence of abdominal aortic aneurysm. While there is no definite left renal vein invasion, the lesion encases the left renal artery. No discrete retroperitoneal lymphadenopathy. Other:  No abdominal ascites. Musculoskeletal: No focal osseous lesions. IMPRESSION: 7.6 cm infiltrating left upper pole renal mass, suspicious for renal neoplasm, although possibly reflecting transitional cell carcinoma or lymphoma rather than renal cell carcinoma. Involvement of the left adrenal gland. Encasement of the left renal artery without left renal vein invasion. Extension into the perinephric fat. 2.2 cm metastasis in segment 7 of the liver. Electronically Signed   By: Julian Hy M.D.   On: 07/21/2020 08:39   NM PET Image Initial (PI) Skull Base To Thigh  Result Date: 08/05/2020 CLINICAL DATA:  Initial treatment strategy for left kidney upper pole malignancy. EXAM: NUCLEAR MEDICINE PET SKULL BASE TO THIGH TECHNIQUE: 7.9 mCi F-18 FDG was injected intravenously. Full-ring PET imaging was performed from the skull base to thigh after the radiotracer. CT data was obtained and used for attenuation correction and anatomic localization. Fasting blood  glucose: 101 mg/dl COMPARISON:  Multiple exams, including CT and MRI examinations from 07/20/2020 and 07/10/2020 FINDINGS: Mediastinal blood pool activity: SUV max 1.88 Liver activity: SUV max NA NECK: No significant abnormal hypermetabolic activity in this region. Incidental CT findings: Mild common carotid atherosclerotic calcification. CHEST: No significant abnormal hypermetabolic activity in this region. Incidental CT findings: Mild cardiomegaly. Mild atherosclerotic calcification of the thoracic aorta. ABDOMEN/PELVIS: Hypermetabolic left kidney upper pole mass partially extending into the renal pelvis. A can be difficult to separate mass from excreted activity, but the mass may have a maximum SUV of 26.8. The mass likely infiltrates down to the level of the mid kidney posteriorly. I do not see a separate well-defined adrenal gland and the mass is probably engulfing the in adrenal gland. A hypermetabolic lesion in the right hepatic lobe near the base of the caudate lobe measures approximately 3.1 by 2.4 cm on image 97 series 4 and has a maximum SUV of 16.1. A smaller hypermetabolic lesion in the right hepatic lobe measuring about 0.7 cm in diameter has a maximum SUV of 5.6. Background liver activity maximum SUV 3.1. These are likely metastatic lesions. No current hypermetabolic adenopathy Incidental CT findings: Aortoiliac atherosclerotic vascular disease. SKELETON: Accentuated metabolic activity at the somewhat degenerated right sternoclavicular joint, maximum SUV 4.0, likely inflammatory. Incidental  CT findings: Left rib deformities from old fractures. Mild bridging spurring of the left sacroiliac joint. Spurring of both acetabula. Thoracic and lumbar spondylosis. IMPRESSION: 1. Large hypermetabolic left kidney upper pole mass likely involving and obscuring the adrenal gland. Two hypermetabolic lesions in the right hepatic lobe favoring metastatic disease. No hypermetabolic adenopathy identified. 2. Other  imaging findings of potential clinical significance: Aortic Atherosclerosis (ICD10-I70.0). Mild cardiomegaly. Degenerative activity in the right sternoclavicular joint. Electronically Signed   By: Van Clines M.D.   On: 08/05/2020 09:10    Labs:  CBC: Recent Labs    07/15/20 1558 08/07/20 0946  WBC 8.1 7.6  HGB 13.9 13.1  HCT 42.5 39.8  PLT 330 302    COAGS: Recent Labs    08/07/20 0946  INR 1.0    BMP: Recent Labs    07/10/20 1257 07/15/20 1558  NA  --  140  K  --  4.9  CL  --  102  CO2  --  22  GLUCOSE  --  94  BUN  --  10  CALCIUM  --  9.7  CREATININE 1.00 1.13    LIVER FUNCTION TESTS: Recent Labs    07/15/20 1558  BILITOT 0.5  AST 16  ALT 10  ALKPHOS 68  PROT 7.5  ALBUMIN 4.2      Assessment and Plan:  Liver lesions in setting of possible RCC concerning for metastatic disease. Plan for image-guided liver lesion biopsy today in IR. Patient is NPO. Afebrile and WBCs WNL. He does not take blood thinners. INR 1.0 today.  Risks and benefits discussed with the patient including, but not limited to bleeding, infection, damage to adjacent structures or low yield requiring additional tests. All of the patient's questions were answered, patient is agreeable to proceed. Consent signed and in chart.   Thank you for this interesting consult.  I greatly enjoyed meeting Jeremy Rodriguez and look forward to participating in their care.  A copy of this report was sent to the requesting provider on this date.  Electronically Signed: Earley Abide, PA-C 08/07/2020, 10:53 AM   I spent a total of 30 Minutes in face to face in clinical consultation, greater than 50% of which was counseling/coordinating care for liver lesion/biopsy.

## 2020-08-08 ENCOUNTER — Encounter (HOSPITAL_COMMUNITY): Payer: Self-pay

## 2020-08-10 ENCOUNTER — Other Ambulatory Visit (HOSPITAL_COMMUNITY): Payer: Medicare Other

## 2020-08-10 NOTE — Telephone Encounter (Signed)
He has a follow up for PET and biopsy on Wednesday

## 2020-08-11 ENCOUNTER — Encounter (HOSPITAL_COMMUNITY): Payer: Self-pay

## 2020-08-12 ENCOUNTER — Ambulatory Visit (HOSPITAL_COMMUNITY): Payer: Medicare Other | Admitting: Hematology

## 2020-08-12 LAB — SURGICAL PATHOLOGY

## 2020-08-13 ENCOUNTER — Inpatient Hospital Stay (HOSPITAL_COMMUNITY): Payer: Medicare Other | Attending: Hematology | Admitting: Hematology

## 2020-08-13 ENCOUNTER — Other Ambulatory Visit (HOSPITAL_COMMUNITY): Payer: Self-pay | Admitting: *Deleted

## 2020-08-13 ENCOUNTER — Other Ambulatory Visit: Payer: Self-pay

## 2020-08-13 VITALS — BP 150/64 | HR 92 | Temp 97.0°F | Resp 18 | Wt 152.1 lb

## 2020-08-13 DIAGNOSIS — N189 Chronic kidney disease, unspecified: Secondary | ICD-10-CM | POA: Diagnosis not present

## 2020-08-13 DIAGNOSIS — Z5112 Encounter for antineoplastic immunotherapy: Secondary | ICD-10-CM | POA: Insufficient documentation

## 2020-08-13 DIAGNOSIS — C652 Malignant neoplasm of left renal pelvis: Secondary | ICD-10-CM | POA: Diagnosis not present

## 2020-08-13 DIAGNOSIS — C7910 Secondary malignant neoplasm of unspecified urinary organs: Secondary | ICD-10-CM

## 2020-08-13 DIAGNOSIS — K219 Gastro-esophageal reflux disease without esophagitis: Secondary | ICD-10-CM | POA: Insufficient documentation

## 2020-08-13 DIAGNOSIS — C787 Secondary malignant neoplasm of liver and intrahepatic bile duct: Secondary | ICD-10-CM | POA: Diagnosis not present

## 2020-08-13 DIAGNOSIS — Z79899 Other long term (current) drug therapy: Secondary | ICD-10-CM | POA: Insufficient documentation

## 2020-08-13 DIAGNOSIS — I129 Hypertensive chronic kidney disease with stage 1 through stage 4 chronic kidney disease, or unspecified chronic kidney disease: Secondary | ICD-10-CM | POA: Insufficient documentation

## 2020-08-13 DIAGNOSIS — F17210 Nicotine dependence, cigarettes, uncomplicated: Secondary | ICD-10-CM | POA: Insufficient documentation

## 2020-08-13 MED ORDER — HYDROCODONE-ACETAMINOPHEN 10-325 MG PO TABS: 1.0000 | ORAL_TABLET | Freq: Four times a day (QID) | ORAL | 0 refills | Status: DC | PRN

## 2020-08-13 NOTE — Progress Notes (Signed)
[name], [place] [address]   CLINIC:  Medical Oncology/Hematology  PCP:  Lindell Spar, MD [address] / [place] [address] [phone]   REASON FOR VISIT:  Follow-up for metastatic urothelial carcinoma  PRIOR THERAPY: none  NGS Results: not done  CURRENT THERAPY: under work-up  BRIEF ONCOLOGIC HISTORY:  Oncology History  Metastatic urothelial carcinoma ([place])  08/13/2020 Initial Diagnosis   Metastatic urothelial carcinoma ([place])   08/13/2020 Cancer Staging   Staging form: Urinary Bladder, AJCC 8th Edition - Clinical stage from 08/13/2020: Stage IVB (cTX, cN0, pM1b) - Signed by Derek Jack, MD on 08/13/2020 Stage prefix: Initial diagnosis WHO/ISUP grade (low/high): High Grade Histologic grading system: 2 grade system   08/13/2020 -  Chemotherapy    Patient is on Treatment Plan: BLADDER PEMBROLIZUMAB Q21D        CANCER STAGING: Cancer Staging Metastatic urothelial carcinoma ([place]) Staging form: Urinary Bladder, AJCC 8th Edition - Clinical stage from 08/13/2020: Stage IVB (cTX, cN0, pM1b) - Signed by Derek Jack, MD on 08/13/2020   INTERVAL HISTORY:  Jeremy Rodriguez, a 70 y.o. male, returns for routine follow-up of his metastatic urothelial carcinoma. Jeremy Rodriguez was last seen on 07/22/2020.   Today he reports feeling good. He refuses chemotherapy. He reports stable sharp pain (6/10) in left flank when not taking percocet; the pain is 0/10 when treated with medication. He is also taking ibuprofen to manage pain. He reports itching after taking oxycodone. He requests time to seek a second opinion.  He requests additional information and time to consider immunotherapy.  REVIEW OF SYSTEMS:  Review of Systems  Constitutional: Negative for appetite change and fatigue.  Musculoskeletal: Positive for myalgias (L side 6/10).  Neurological: Positive for dizziness.  Psychiatric/Behavioral: Positive for sleep  disturbance.  All other systems reviewed and are negative.   PAST MEDICAL/SURGICAL HISTORY:  Past Medical History:  Diagnosis Date  . Chronic kidney disease   . GERD (gastroesophageal reflux disease)   . Hypertension   . Medical history non-contributory    Past Surgical History:  Procedure Laterality Date  . appendectomy      SOCIAL HISTORY:  Social History   Socioeconomic History  . Marital status: Single    Spouse name: Not on file  . Number of children: Not on file  . Years of education: Not on file  . Highest education level: Not on file  Occupational History  . Not on file  Tobacco Use  . Smoking status: Current Some Day Smoker  . Smokeless tobacco: Never Used  . Tobacco comment: a few cigarettes a day  Substance and Sexual Activity  . Alcohol use: Yes    Comment: occ  . Drug use: Never  . Sexual activity: Not on file  Other Topics Concern  . Not on file  Social History Narrative  . Not on file   Social Determinants of Health   Financial Resource Strain: Low Risk   . Difficulty of Paying Living Expenses: Not very hard  Food Insecurity: No Food Insecurity  . Worried About Charity fundraiser in the Last Year: Never true  . Ran Out of Food in the Last Year: Never true  Transportation Needs: No Transportation Needs  . Lack of Transportation (Medical): No  . Lack of Transportation (Non-Medical): No  Physical Activity: Inactive  . Days of Exercise per Week: 0 days  . Minutes of Exercise per Session: 0 min  Stress: No Stress Concern Present  .  Feeling of Stress : Not at all  Social Connections: Unknown  . Frequency of Communication with Friends and Family: Twice a week  . Frequency of Social Gatherings with Friends and Family: Twice a week  . Attends Religious Services: Not on file  . Active Member of Clubs or Organizations: Not on file  . Attends Archivist Meetings: Not on file  . Marital Status: Divorced  Human resources officer Violence: Not on  file    FAMILY HISTORY:  Family History  Problem Relation Age of Onset  . Colon cancer Neg Hx   . Colon polyps Neg Hx   . Liver cancer Neg Hx   . Pancreatic cancer Neg Hx     CURRENT MEDICATIONS:  Current Outpatient Medications  Medication Sig Dispense Refill  . amLODipine (NORVASC) 5 MG tablet Take 1 tablet (5 mg total) by mouth daily. 90 tablet 1  . docusate sodium (COLACE) 100 MG capsule Take 1 capsule (100 mg total) by mouth daily as needed for mild constipation. 30 capsule 0  . HYDROcodone-acetaminophen (NORCO) 10-325 MG tablet Take 1 tablet by mouth every 6 (six) hours as needed. 30 tablet 0  . linaclotide (LINZESS) 145 MCG CAPS capsule Take 1 capsule (145 mcg total) by mouth daily before breakfast. 30 capsule 5  . oxyCODONE-acetaminophen (PERCOCET/ROXICET) 5-325 MG tablet Take 1 tablet by mouth every 12 (twelve) hours as needed for severe pain. 60 tablet 0  . pantoprazole (PROTONIX) 40 MG tablet Take 1 tablet (40 mg total) by mouth daily. 30 tablet 3  . telmisartan (MICARDIS) 20 MG tablet Take 20 mg by mouth daily.     No current facility-administered medications for this visit.    ALLERGIES:  No Known Allergies  PHYSICAL EXAM:  Performance status (ECOG): 1 - Symptomatic but completely ambulatory  Vitals:   08/13/20 1446  BP: (!) 150/64  Pulse: 92  Resp: 18  Temp: (!) 97 F (36.1 C)  SpO2: 100%   Wt Readings from Last 3 Encounters:  08/13/20 152 lb 1.9 oz (69 kg)  08/07/20 152 lb (68.9 kg)  07/22/20 157 lb 3.2 oz (71.3 kg)   Physical Exam Vitals reviewed.  Constitutional:      Appearance: Normal appearance.  Cardiovascular:     Rate and Rhythm: Normal rate and regular rhythm.     Pulses: Normal pulses.     Heart sounds: Normal heart sounds.  Pulmonary:     Effort: Pulmonary effort is normal.     Breath sounds: Normal breath sounds.  Neurological:     General: No focal deficit present.     Mental Status: He is alert and oriented to person, place, and  time.  Psychiatric:        Mood and Affect: Mood normal.        Behavior: Behavior normal.      LABORATORY DATA:  I have reviewed the labs as listed.  CBC Latest Ref Rng & Units 08/07/2020 07/15/2020  WBC 4.0 - 10.5 K/uL 7.6 8.1  Hemoglobin 13.0 - 17.0 g/dL 13.1 13.9  Hematocrit 39.0 - 52.0 % 39.8 42.5  Platelets 150 - 400 K/uL 302 330   CMP Latest Ref Rng & Units 07/15/2020 07/10/2020  Glucose 65 - 99 mg/dL 94 -  BUN 8 - 27 mg/dL 10 -  Creatinine 0.76 - 1.27 mg/dL 1.13 1.00  Sodium 134 - 144 mmol/L 140 -  Potassium 3.5 - 5.2 mmol/L 4.9 -  Chloride 96 - 106 mmol/L 102 -  CO2 20 -  29 mmol/L 22 -  Calcium 8.6 - 10.2 mg/dL 9.7 -  Total Protein 6.0 - 8.5 g/dL 7.5 -  Total Bilirubin 0.0 - 1.2 mg/dL 0.5 -  Alkaline Phos 44 - 121 IU/L 68 -  AST 0 - 40 IU/L 16 -  ALT 0 - 44 IU/L 10 -    DIAGNOSTIC IMAGING:  I have independently reviewed the scans and discussed with the patient. CT CHEST W CONTRAST  Result Date: 07/21/2020 CLINICAL DATA:  Left renal mass with hepatic metastasis EXAM: CT CHEST WITH CONTRAST TECHNIQUE: Multidetector CT imaging of the chest was performed during intravenous contrast administration. CONTRAST:  22mL OMNIPAQUE IOHEXOL 300 MG/ML  SOLN COMPARISON:  None. FINDINGS: Cardiovascular: Heart is normal in size.  No pericardial effusion. No evidence of thoracic aortic aneurysm. Atherosclerotic calcifications of the aortic arch. Mild coronary atherosclerosis of the LAD. Mediastinum/Nodes: No suspicious mediastinal adenopathy. Visualized thyroid is unremarkable. Lungs/Pleura: Evaluation of the lung parenchyma is constrained by respiratory motion. Within that constraint, there are no suspicious pulmonary nodules. No focal consolidation. No pleural effusion or pneumothorax. Upper Abdomen: Evaluated on dedicated MRI abdomen. Musculoskeletal: Old left 3rd through 6th rib fracture deformities. Degenerative changes of the mid/lower thoracic spine. IMPRESSION: No evidence of metastatic  disease in the chest. Aortic Atherosclerosis (ICD10-I70.0). Electronically Signed   By: Julian Hy M.D.   On: 07/21/2020 08:41   MR LIVER W WO CONTRAST  Result Date: 07/21/2020 CLINICAL DATA:  Renal mass with suspected hepatic metastasis on CT EXAM: MRI ABDOMEN WITHOUT AND WITH CONTRAST TECHNIQUE: Multiplanar multisequence MR imaging of the abdomen was performed both before and after the administration of intravenous contrast. CONTRAST:  20mL GADAVIST GADOBUTROL 1 MMOL/ML IV SOLN COMPARISON:  CT abdomen/pelvis dated 07/10/2020 FINDINGS: Lower chest: Evaluated on dedicated CT chest. Hepatobiliary: 2.2 x 1.7 cm enhancing lesion in the medial aspect of segment 7 (series 4/image 9), suspicious for metastasis. Gallbladder is unremarkable. No intrahepatic or extrahepatic ductal dilatation. Pancreas:  Within normal limits. Spleen:  Within normal limits. Adrenals/Urinary Tract: Right adrenal gland is within normal limits. Left adrenal gland is obscured/involved by the left renal mass. 6.0 x 7.6 x 6.9 cm infiltrating enhancing left upper pole renal mass, suspicious for renal neoplasm, extending into the left renal sinus. This unusual appearance raises the possibility of transitional cell carcinoma or possibly lymphoma in addition to renal cell carcinoma. Lesion extends into the perinephric fat (series 17/image 37). Right kidney is within normal limits.  No hydronephrosis. Stomach/Bowel: Stomach is within normal limits. Visualized bowel is unremarkable. Vascular/Lymphatic:  No evidence of abdominal aortic aneurysm. While there is no definite left renal vein invasion, the lesion encases the left renal artery. No discrete retroperitoneal lymphadenopathy. Other:  No abdominal ascites. Musculoskeletal: No focal osseous lesions. IMPRESSION: 7.6 cm infiltrating left upper pole renal mass, suspicious for renal neoplasm, although possibly reflecting transitional cell carcinoma or lymphoma rather than renal cell carcinoma.  Involvement of the left adrenal gland. Encasement of the left renal artery without left renal vein invasion. Extension into the perinephric fat. 2.2 cm metastasis in segment 7 of the liver. Electronically Signed   By: Julian Hy M.D.   On: 07/21/2020 08:39   NM PET Image Initial (PI) Skull Base To Thigh  Result Date: 08/05/2020 CLINICAL DATA:  Initial treatment strategy for left kidney upper pole malignancy. EXAM: NUCLEAR MEDICINE PET SKULL BASE TO THIGH TECHNIQUE: 7.9 mCi F-18 FDG was injected intravenously. Full-ring PET imaging was performed from the skull base to thigh after the  radiotracer. CT data was obtained and used for attenuation correction and anatomic localization. Fasting blood glucose: 101 mg/dl COMPARISON:  Multiple exams, including CT and MRI examinations from 07/20/2020 and 07/10/2020 FINDINGS: Mediastinal blood pool activity: SUV max 1.88 Liver activity: SUV max NA NECK: No significant abnormal hypermetabolic activity in this region. Incidental CT findings: Mild common carotid atherosclerotic calcification. CHEST: No significant abnormal hypermetabolic activity in this region. Incidental CT findings: Mild cardiomegaly. Mild atherosclerotic calcification of the thoracic aorta. ABDOMEN/PELVIS: Hypermetabolic left kidney upper pole mass partially extending into the renal pelvis. A can be difficult to separate mass from excreted activity, but the mass may have a maximum SUV of 26.8. The mass likely infiltrates down to the level of the mid kidney posteriorly. I do not see a separate well-defined adrenal gland and the mass is probably engulfing the in adrenal gland. A hypermetabolic lesion in the right hepatic lobe near the base of the caudate lobe measures approximately 3.1 by 2.4 cm on image 97 series 4 and has a maximum SUV of 16.1. A smaller hypermetabolic lesion in the right hepatic lobe measuring about 0.7 cm in diameter has a maximum SUV of 5.6. Background liver activity maximum SUV  3.1. These are likely metastatic lesions. No current hypermetabolic adenopathy Incidental CT findings: Aortoiliac atherosclerotic vascular disease. SKELETON: Accentuated metabolic activity at the somewhat degenerated right sternoclavicular joint, maximum SUV 4.0, likely inflammatory. Incidental CT findings: Left rib deformities from old fractures. Mild bridging spurring of the left sacroiliac joint. Spurring of both acetabula. Thoracic and lumbar spondylosis. IMPRESSION: 1. Large hypermetabolic left kidney upper pole mass likely involving and obscuring the adrenal gland. Two hypermetabolic lesions in the right hepatic lobe favoring metastatic disease. No hypermetabolic adenopathy identified. 2. Other imaging findings of potential clinical significance: Aortic Atherosclerosis (ICD10-I70.0). Mild cardiomegaly. Degenerative activity in the right sternoclavicular joint. Electronically Signed   By: Van Clines M.D.   On: 08/05/2020 09:10   CT Biopsy  Result Date: 08/07/2020 INDICATION: Concern for metastatic renal cell carcinoma, now with indeterminate liver lesions worrisome for metastatic disease. Please perform image guided hepatic lesion biopsy for tissue diagnostic purposes. EXAM: ULTRASOUND GUIDED LIVER LESION BIOPSY COMPARISON:  PET-CT-08/04/2020; chest CT-07/20/2020; abdominal MRI-07/20/2020; CT abdomen and pelvis-07/10/2020 MEDICATIONS: None ANESTHESIA/SEDATION: Fentanyl 50 mcg IV; Versed 1 mg IV Total Moderate Sedation time:  20 Minutes. The patient's level of consciousness and vital signs were monitored continuously by radiology nursing throughout the procedure under my direct supervision. COMPLICATIONS: None immediate. PROCEDURE: Informed written consent was obtained from the patient after a discussion of the risks, benefits and alternatives to treatment. The patient understands and consents the procedure. A timeout was performed prior to the initiation of the procedure. Patient was positioned  supine on the CT gantry demonstrating unchanged appearance of the ill-defined hypoattenuating lesion within the caudate lobe measuring approximately 2.5 x 2.5 cm (image 23, series 2 as well as the ill-defined punctate (approximately 1.2 cm) hypoattenuating lesion within the dome of the right lobe of the liver (image 16, series 2). Both of these lesions were identified sonographically and the procedure was planned The right upper abdominal quadrant was prepped and draped in the usual sterile fashion. While the additional plan was to proceed with ultrasound-guided biopsy of the smaller liver lesion the lesion became very difficult to evaluate once the patient was sedated given interposition of the pulmonary parenchyma. As such the decision was made to proceed with ultrasound-guided biopsy of the larger lesion within the caudate. After lying soft tissues were  anesthetized with 1% lidocaine with epinephrine, a 17 gauge co-axial needle was advanced into a peripheral aspect of the lesion. Appropriate needle position was confirmed with CT imaging. Next, 2 core biopsies with an 18 gauge core device under direct ultrasound guidance. The coaxial needle tract was embolized with a small amount of Gel-Foam slurry and superficial hemostasis was obtained with manual compression. Postprocedural CT imaging was negative for definitive area of hemorrhage or additional complication. A dressing was applied. The patient tolerated the procedure well without immediate post procedural complication. IMPRESSION: Technically successful ultrasound and CT guided core needle biopsy of dominant hypermetabolic lesion within the caudate. Electronically Signed   By: Sandi Mariscal M.D.   On: 08/07/2020 12:51     ASSESSMENT:  1. 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 - 20 pound weight loss in the last 4 months due to decreased  appetite.  Left upper quadrant pain for the past 2 to 3 months. - CTAP with contrast on 07/10/2020 showed ill-defined low-density mass in the right hepatic lobe measuring 2 x 2 cm.  Left upper pole kidney mass measuring 7.6 x 5.9 cm with encasement and narrowing of the left renal artery and vein.  No intravascular thrombus noted.  Mass encases the left adrenal gland.  Small right adrenal nodule measuring 11 mm, likely benign.  No other evidence of metastatic disease. - MRI of the liver with and without contrast on 07/20/2020 shows 2.2 x 1.7 cm enhancing lesion in the medial aspect of the segment 7, suspicious for metastasis.  Left renal mass, 6 x 7.6 x 6.9 cm infiltrating enhancing left upper pole renal mass, extending into the left renal sinus.  Possible TCC or lymphoma rather than renal cell carcinoma. - CT chest on 07/20/2020 with no evidence of metastatic disease. - PET scan on 08/04/2020 showed large hypermetabolic left upper pole mass and 2 hypermetabolic lesions in the right hepatic lobe favoring metastatic disease.  No hypermetabolic adenopathy.  2.  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 - Reports exposure to carbon black dust.  Smoked half pack per day for 30 years, currently smoking 2 to 3 cigarettes/day. - No family history of malignancy.   PLAN:  1. Metastatic poorly differentiated urothelial carcinoma arising in the renal pelvis: - We discussed biopsy results from 08/07/2020. - Liver biopsy showed poorly differentiated metastatic urothelial carcinoma.  IHC positive for CK5/6, CK7, GATA3, p40 and patchy positivity with p63 and PAX8. - We discussed standard first-line chemotherapy with gemcitabine and cisplatin followed by Avelumab maintenance therapy, as he is cisplatin eligible. - However he adamantly refused any form of chemotherapy.  I have also offered him second opinion at a tertiary center.  He initially wanted to go to [place] and  get treated there, but changed his mind. - He was also initially reluctant to consider immunotherapy but finally agreed. - I have recommended pembrolizumab based on phase 2 keynote 052 study.  I have quoted response rates around the 30% and median duration of response around 33 months. - We have discussed side effects of immunotherapy including but not limited to fatigue, skin itching, thyroid dysfunction and severe but rare side effects including colitis, pneumonitis and hypophysitis. - I will also recommend NGS testing with Caris panel.  I will discuss it at next visit. - We will also reach out to urology if there  is good response 2 to 3 months after starting immunotherapy for possible resection options.  2.  Left upper quadrant pain: - He is currently taking NSAID. - He takes oxycodone 5/325 if he gets severe pain.  He reports itching around the nose and on the face after taking oxycodone. - Oxycodone is only helping for 3 to 4 hours. - I have recommended switching to hydrocodone 10/325 and see if it helps.  We have sent a prescription #30.   Orders placed this encounter:  No orders of the defined types were placed in this encounter.  Total time spent 40 minutes with more than 50% of the time spent face-to-face discussing initial diagnosis, treatment plan, adverse effects, counseling and coordination of care.  Derek Jack, MD [facility] [phone]   I, Thana Ates, am acting as a scribe for Dr. Sanda Linger.  I, Derek Jack MD, have reviewed the above documentation for accuracy and completeness, and I agree with the above.

## 2020-08-13 NOTE — Progress Notes (Signed)
START OFF PATHWAY REGIMEN - Bladder ° ° °[id number]:Pembrolizumab 200 mg IV D1 q21 Days: °  A cycle is every 21 days: °    Pembrolizumab  ° °**Always confirm dose/schedule in your pharmacy ordering system** ° °Patient Characteristics: °Advanced/Metastatic Disease, First Line, No Prior Platinum-Based Therapy, Good Renal Function (CrCl ? 50 mL/min) °Therapeutic Status: Advanced/Metastatic Disease °Line of Therapy: First Line °Prior Platinum-Based Therapy<= No °Renal Function: Good Renal Function (CrCl ? 50 mL/min) °Intent of Therapy: °Non-Curative / Palliative Intent, Discussed with Patient °

## 2020-08-13 NOTE — Patient Instructions (Addendum)
[place] at [facility] Discharge Instructions  You were seen today by Dr. Delton Coombes. He went over your recent results. Your cancer is called stage IV metastatic urothelial carcinoma. A possible treatment includes an immunotherapy called Keytruda, given every 3 weeks. You will start treatment next week. Side effects include: fatigue, dry skin, and thyroid dysfunction. Stop taking percocet; you will instead be prescribed Narco to be taken every 4-6 hours as needed for pain. Dr. Delton Coombes will see you back in 4 weeks for follow up.    Thank you for choosing [place] at [facility] to provide your oncology and hematology care.  To afford each patient quality time with our provider, please arrive at least 15 minutes before your scheduled appointment time.   If you have a lab appointment with the [facility] please come in thru the Main Entrance and check in at the main information desk  You need to re-schedule your appointment should you arrive 10 or more minutes late.  We strive to give you quality time with our providers, and arriving late affects you and other patients whose appointments are after yours.  Also, if you no show three or more times for appointments you may be dismissed from the clinic at the providers discretion.     Again, thank you for choosing [facility].  Our hope is that these requests will decrease the amount of time that you wait before being seen by our physicians.       _____________________________________________________________  Should you have questions after your visit to [facility], please contact our office at [phone] between the hours of 8:00 a.m. and 4:30 p.m.  Voicemails left after 4:00 p.m. will not be returned until the following business day.  For prescription refill requests, have your pharmacy contact our office and allow 72 hours.    Cancer Center Support Programs:   >  Cancer Support Group  2nd Tuesday of the month 1pm-2pm, Journey Room

## 2020-08-13 NOTE — Progress Notes (Signed)
Aug 13, 2020   Patient: Jeremy Rodriguez  Date of Birth: May 10, 1950  Date of Visit: 08/13/2020    To Whom it May Concern:  Amay Mijangos was seen in my clinic on 08/13/2020. Please excuse his daughter, [name], as she needed to be present for visit.      Sincerely,       Derek Jack, MD

## 2020-08-17 ENCOUNTER — Other Ambulatory Visit: Payer: Self-pay | Admitting: *Deleted

## 2020-08-17 MED ORDER — TELMISARTAN 20 MG PO TABS: 20.0000 mg | ORAL_TABLET | Freq: Every day | ORAL | 0 refills | Status: DC

## 2020-08-17 NOTE — Progress Notes (Signed)
Pharmacist Chemotherapy Monitoring - Initial Assessment    Anticipated start date: 08/20/20  Regimen:  . Are orders appropriate based on the patient's diagnosis, regimen, and cycle? Yes . Does the plan date match the patient's scheduled date? Yes . Is the sequencing of drugs appropriate? Yes . Are the premedications appropriate for the patient's regimen? Yes . Prior Authorization for treatment is: Approved o If applicable, is the correct biosimilar selected based on the patient's insurance? not applicable  Organ Function and Labs: Marland Kitchen Are dose adjustments needed based on the patient's renal function, hepatic function, or hematologic function? No . Are appropriate labs ordered prior to the start of patient's treatment? Yes . Other organ system assessment, if indicated: N/A . The following baseline labs, if indicated, have been ordered: pembrolizumab: baseline TSH +/- T4  Dose Assessment: . Are the drug doses appropriate? Yes . Are the following correct: o Drug concentrations Yes o IV fluid compatible with drug Yes o Administration routes Yes o Timing of therapy Yes . If applicable, does the patient have documented access for treatment and/or plans for port-a-cath placement? no . If applicable, have lifetime cumulative doses been properly documented and assessed? not applicable Lifetime Dose Tracking  No doses have been documented on this patient for the following tracked chemicals: Doxorubicin, Epirubicin, Idarubicin, Daunorubicin, Mitoxantrone, Bleomycin, Oxaliplatin, Carboplatin, Liposomal Doxorubicin  o   Toxicity Monitoring/Prevention: . The patient has the following take home antiemetics prescribed: N/A . The patient has the following take home medications prescribed: N/A . Medication allergies and previous infusion related reactions, if applicable, have been reviewed and addressed. No . The patient's current medication list has been assessed for drug-drug interactions with their  chemotherapy regimen. no significant drug-drug interactions were identified on review.  Order Review: . Are the treatment plan orders signed? Yes . Is the patient scheduled to see a provider prior to their treatment? No  I verify that I have reviewed each item in the above checklist and answered each question accordingly.  Romualdo Bolk [place], [place], 08/17/2020  1:17 PM

## 2020-08-18 ENCOUNTER — Ambulatory Visit (HOSPITAL_COMMUNITY): Payer: Medicare Other | Admitting: Hematology

## 2020-08-19 ENCOUNTER — Ambulatory Visit (HOSPITAL_COMMUNITY): Payer: Medicare Other | Admitting: Hematology

## 2020-08-19 ENCOUNTER — Other Ambulatory Visit (HOSPITAL_COMMUNITY): Payer: Self-pay

## 2020-08-19 DIAGNOSIS — C7910 Secondary malignant neoplasm of unspecified urinary organs: Secondary | ICD-10-CM

## 2020-08-19 DIAGNOSIS — C652 Malignant neoplasm of left renal pelvis: Secondary | ICD-10-CM

## 2020-08-19 NOTE — Patient Instructions (Signed)
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  You will be treated in the clinic every 3 weeks with an immunotherapy drug called pembrolizumab Beryle Flock).  The intent of treatment is to control this cancer, keep it from spreading further, and to alleviate any symptoms you may be having related to this disease. You will see the doctor regularly throughout treatment.  We will obtain blood work from you prior to every treatment and monitor your results to make sure it is safe to give your treatment. The doctor monitors your response to treatment by the way you are feeling, your blood work, and by obtaining scans periodically.  There will be wait times while you are here for treatment.  It will take about 30 minutes to 1 hour for your lab work to result.  Then there will be wait times while pharmacy mixes your medications.    Pembrolizumab Beryle Flock)  About This Drug Pembrolizumab is used to treat cancer. It is given in the vein (IV).  This drug will take 30 minutes to infuse.  Possible Side Effects . Tiredness . Fever . Nausea . Decreased appetite (decreased hunger) . Loose bowel movements (diarrhea) . Constipation (not able to move bowels) . Trouble breathing . Rash . Itching . Muscle and bone pain . Cough  Note: Each of the side effects above was reported in 20% or greater of patients treated with pembrolizumab. Not all possible side effects are included above.  Warnings and Precautions  . This drug works with your immune system and can cause inflammation in any of your organs and tissues and can change how they work. This may put you at risk for developing serious medical problems which can very rarely be fatal.  . Colitis (swelling or inflammation in the colon) - symptoms are loose bowel movements (diarrhea) stomach cramping, and sometimes blood in the stool  . Changes in liver function. Your liver function will  be checked as needed.  . Changes in kidney function, which can very rarely be fatal. Your kidney function will be checked as needed.  . Inflammation (swelling) of the lungs which can very rarely be fatal - you may have a dry cough or trouble breathing.  . This drug may affect some of your hormone glands (especially the thyroid, adrenals, pituitary and pancreas). Your hormone levels will be checked as needed.  . Blood sugar levels may change and you may develop diabetes. If you already have diabetes, changes may need to be made to your diabetes medication.  . Severe allergic skin reaction, which can very rarely be fatal. You may develop blisters on your skin that are filled with fluid or a severe red rash all over your body that may be painful.  . Increased risk of organ rejection in patients who have received donor organs  . Increased risk of complications in patients who will undergo a stem cell transplant after receiving pembrolizumab.  . While you are getting this drug in your vein (IV), you may have a reaction to the drug. Your nurse will check you closely for these signs: fever or shaking chills, flushing, facial swelling, feeling dizzy, headache, trouble breathing, rash, itching, chest tightness, or chest pain. These reactions may occur after your infusion. If this happens, call 911 for emergency care.  Important Information This drug may be present in the saliva, tears, sweat, urine, stool, vomit, semen, and vaginal secretions. Talk to your doctor and/or your nurse about the  necessary precautions to take during this time.  Treating Side Effects  . Ask your doctor or nurse about medicines that are available to help stop or lessen constipation, diarrhea and/or nausea.  . Drink plenty of fluids (a minimum of eight glasses per day is recommended).  . If you are not able to move your bowels, check with your doctor or nurse before you use any enemas, laxatives, or suppositories  . To  help with nausea and vomiting, eat small, frequent meals instead of three large meals a day. Choose foods and drinks that are at room temperature. Ask your nurse or doctor about other helpful tips and medicine that is available to help or stop lessen these symptoms.  . If you get diarrhea, eat low-fiber foods that are high in protein and calories and avoid foods that can irritate your digestive tracts or lead to cramping. Ask your nurse or doctor about medicine that can lessen or stop your diarrhea.  . Manage tiredness by pacing your activities for the day. Be sure to include periods of rest between energy-draining activities  . Keeping your pain under control is important to your wellbeing. Please tell your doctor or nurse if you are experiencing pain.  . If you have diabetes, keep good control of your blood sugar level. Tell your nurse or your doctor if your glucose levels are higher or lower than normal  . If you get a rash do not put anything on it unless your doctor or nurse says you may. Keep the area around the rash clean and dry. Ask your doctor for medicine if your rash bothers you.  . Infusion reactions may happen for 24 hours after your infusion. If this happens, call 911 for emergency care.  Food and Drug Interactions  . There are no known interactions of pembrolizumab with food.  . There are no known interactions of pembrolizumab with other medications.  . Tell your doctor and pharmacist about all the medicines and dietary supplements (vitamins, minerals, herbs and others) that you are taking at this time. The safety and use of dietary supplements and alternative agents are often not known. Using these might affect your cancer or interfere with your treatment. Until more is known, you should not use dietary supplements or alternative agents without your cancer doctor's help.   When to Call the Doctor Call your doctor or nurse if you have any of the following symptoms and/or any  new or unusual symptoms:  . Fever of 100.4 F (38 C) or higher  . Chills  . Wheezing or trouble breathing  . Rash or itching  . Feeling dizzy or lightheaded  . Loose bowel movements (diarrhea) more than 4 times a day or diarrhea with weakness or lightheadedness, or diarrhea that is not controlled by medications  . Nausea that stops you from eating or drinking, and/or that is not relieved by prescribed medicines  . Lasting loss of appetite or rapid weight loss of five pounds in a week  . Fatigue that interferes with your daily activities  . No bowel movement for 3 days or you feel uncomfortable  . Extreme weakness that interferes with normal activities  . Bad abdominal pain, especially in upper right area  . Decreased urine  . Unusual thirst or passing urine often  . Rash that is not relieved by prescribed medicines  . Flu-like symptoms: fever, headache, muscle and joint aches, and fatigue (low energy, feeling weak)  . Signs of liver problems: dark urine, pale  bowel movements, bad stomach pain, feeling very tired and weak, unusual itching, or yellowing of the eyes or skin  . Signs of infusion reactions such as fever or shaking chills, flushing, facial swelling, feeling dizzy, headache, trouble breathing, rash, itching, chest tightness, or chest pain.   Reproduction Warnings  . Pregnancy warning: This drug may have harmful effects on the unborn baby. Women of childbearing potential should use effective methods of birth control during your cancer treatment and for at least 4 months after treatment. Let your doctor know right away if you think you may be pregnant  . Breast feeding warning: It is not known if this drug passes into breast milk. It is recommended that women do not breastfeed during treatment and for 4 months after treatment.  . Fertility warning: Human fertility studies have not been done with this drug. Talk with your doctor or nurse if you plan to have  children.   SELF CARE ACTIVITIES WHILE ON CHEMOTHERAPY/IMMUNOTHERAPY:  Hydration Increase your fluid intake 48 hours prior to treatment and drink at least 8 to 12 cups (64 ounces) of water/decaffeinated beverages per day after treatment. You can still have your cup of coffee or soda but these beverages do not count as part of your 8 to 12 cups that you need to drink daily. No alcohol intake.  Medications Continue taking your normal prescription medication as prescribed.  If you start any new herbal or new supplements please let us know first to make sure it is safe.  Mouth Care Have teeth cleaned professionally before starting treatment. Keep dentures and partial plates clean. Use soft toothbrush and do not use mouthwashes that contain alcohol. Biotene is a good mouthwash that is available at most pharmacies or may be ordered by calling [phone]. Use warm salt water gargles (1 teaspoon salt per 1 quart warm water) before and after meals and at bedtime. Or you may rinse with 2 tablespoons of three-percent hydrogen peroxide mixed in eight ounces of water. If you are still having problems with your mouth or sores in your mouth please call the clinic. If you need dental work, please let the doctor know before you go for your appointment so that we can coordinate the best possible time for you in regards to your chemo regimen. You need to also let your dentist know that you are actively taking chemo. We may need to do labs prior to your dental appointment.  Skin Care Always use sunscreen that has not expired and with SPF (Sun Protection Factor) of 50 or higher. Wear hats to protect your head from the sun. Remember to use sunscreen on your hands, ears, face, & feet.  Use good moisturizing lotions such as udder cream, eucerin, or even Vaseline. Some chemotherapies can cause dry skin, color changes in your skin and nails.    . Avoid long, hot showers or baths. . Use gentle, fragrance-free soaps and  laundry detergent. . Use moisturizers, preferably creams or ointments rather than lotions because the thicker consistency is better at preventing skin dehydration. Apply the cream or ointment within 15 minutes of showering. Reapply moisturizer at night, and moisturize your hands every time after you wash them.   Infection Prevention Please wash your hands for at least 30 seconds using warm soapy water. Handwashing is the #1 way to prevent the spread of germs. Stay away from sick people or people who are getting over a cold. If you develop respiratory systems such as green/yellow mucus production or productive  cough or persistent cough let us know and we will see if you need an antibiotic. It is a good idea to keep a pair of gloves on when going into grocery stores/[employer] to decrease your risk of coming into contact with germs on the carts, etc. Carry alcohol hand gel with you at all times and use it frequently if out in public. If your temperature reaches 100.5 or higher please call the clinic and let us know.  If it is after hours or on the weekend please go to the ER if your temperature is over 100.4.  Please have your own personal thermometer at home to use.    Sex and bodily fluids If you are going to have sex, a condom must be used to protect the person that isn't taking immunotherapy. For a few days after treatment, immunotherapy can be excreted through your bodily fluids.  When using the toilet please close the lid and flush the toilet twice.  Do this for a few day after you have had immunotherapy.   Contraception It is not known for sure whether or not immunotherapy drugs can be passed on through semen or secretions from the vagina. Because of this some doctors advise people to use a barrier method if you have sex during treatment. This applies to vaginal, anal or oral sex.  Generally, doctors advise a barrier method only for the time you are actually having the treatment and for about a week  after your treatment.  Advice like this can be worrying, but this does not mean that you have to avoid being intimate with your partner. You can still have close contact with your partner and continue to enjoy sex.  Animals If you have cats or birds we just ask that you not change the litter or change the cage.  Please have someone else do this for you while you are on immunotherapy.   Food Safety During and After Cancer Treatment Food safety is important for people both during and after cancer treatment. Cancer and cancer treatments, such as chemotherapy, radiation therapy, and stem cell/bone marrow transplantation, often weaken the immune system. This makes it harder for your body to protect itself from foodborne illness, also called food poisoning. Foodborne illness is caused by eating food that contains harmful bacteria, parasites, or viruses.  Foods to avoid Some foods have a higher risk of becoming tainted with bacteria. These include: Marland Kitchen Unwashed fresh fruit and vegetables, especially leafy vegetables that can hide dirt and other contaminants . Raw sprouts, such as alfalfa sprouts . Raw or undercooked beef, especially ground beef, or other raw or undercooked meat and poultry . Fatty, fried, or spicy foods immediately before or after treatment.  These can sit heavy on your stomach and make you feel nauseous. . Raw or undercooked shellfish, such as oysters. . Sushi and sashimi, which often contain raw fish.  . Unpasteurized beverages, such as unpasteurized fruit juices, raw milk, raw yogurt, or cider . Undercooked eggs, such as soft boiled, over easy, and poached; raw, unpasteurized eggs; or foods made with raw egg, such as homemade raw cookie dough and homemade mayonnaise  Simple steps for food safety  Shop smart. . Do not buy food stored or displayed in an unclean area. . Do not buy bruised or damaged fruits or vegetables. . Do not buy cans that have cracks, dents, or bulges. . Pick  up foods that can spoil at the end of your shopping trip and store them in a  cooler on the way home.  Prepare and clean up foods carefully. . Rinse all fresh fruits and vegetables under running water, and dry them with a clean towel or paper towel. . Clean the top of cans before opening them. . After preparing food, wash your hands for 20 seconds with hot water and soap. Pay special attention to areas between fingers and under nails. . Clean your utensils and dishes with hot water and soap. Marland Kitchen Disinfect your kitchen and cutting boards using 1 teaspoon of liquid, unscented bleach mixed into 1 quart of water.    Dispose of old food. . Eat canned and packaged food before its expiration date (the "use by" or "best before" date). . Consume refrigerated leftovers within 3 to 4 days. After that time, throw out the food. Even if the food does not smell or look spoiled, it still may be unsafe. Some bacteria, such as Listeria, can grow even on foods stored in the refrigerator if they are kept for too long.  Take precautions when eating out. . At restaurants, avoid buffets and salad bars where food sits out for a long time and comes in contact with many people. Food can become contaminated when someone with a virus, often a norovirus, or another "bug" handles it. . Put any leftover food in a "to-go" container yourself, rather than having the server do it. And, refrigerate leftovers as soon as you get home. . Choose restaurants that are clean and that are willing to prepare your food as you order it cooked.    SYMPTOMS TO REPORT AS SOON AS POSSIBLE AFTER TREATMENT:   FEVER GREATER THAN 100.4 F   CHILLS WITH OR WITHOUT FEVER   NAUSEA AND VOMITING THAT IS NOT CONTROLLED WITH YOUR NAUSEA MEDICATION   UNUSUAL SHORTNESS OF BREATH   UNUSUAL BRUISING OR BLEEDING   TENDERNESS IN MOUTH AND THROAT WITH OR WITHOUT PRESENCE OF ULCERS   URINARY PROBLEMS   BOWEL PROBLEMS   UNUSUAL RASH      Wear comfortable clothing and clothing appropriate for easy access to any Portacath or PICC line. Let us know if there is anything that we can do to make your therapy better!   What to do if you need assistance after hours or on the weekends: CALL [phone].  HOLD on the line, do not hang up.  You will hear multiple messages but at the end you will be connected with a nurse triage line.  They will contact the doctor if necessary.  Most of the time they will be able to assist you.  Do not call the hospital operator.    I have been informed and understand all of the instructions given to me and have received a copy. I have been instructed to call the clinic [phone] or my family physician as soon as possible for continued medical care, if indicated. I do not have any more questions at this time but understand that I may call the [facility] or the Patient Navigator at [phone] during office hours should I have questions or need assistance in obtaining follow-up care.

## 2020-08-20 ENCOUNTER — Other Ambulatory Visit: Payer: Self-pay

## 2020-08-20 ENCOUNTER — Inpatient Hospital Stay (HOSPITAL_COMMUNITY): Payer: Medicare Other

## 2020-08-20 ENCOUNTER — Encounter (HOSPITAL_COMMUNITY): Payer: Self-pay

## 2020-08-20 ENCOUNTER — Ambulatory Visit (HOSPITAL_COMMUNITY): Payer: Medicare Other | Admitting: Hematology

## 2020-08-20 VITALS — BP 146/72 | HR 68 | Temp 97.2°F | Resp 18 | Wt 152.8 lb

## 2020-08-20 DIAGNOSIS — C652 Malignant neoplasm of left renal pelvis: Secondary | ICD-10-CM

## 2020-08-20 DIAGNOSIS — Z5112 Encounter for antineoplastic immunotherapy: Secondary | ICD-10-CM | POA: Diagnosis not present

## 2020-08-20 DIAGNOSIS — I129 Hypertensive chronic kidney disease with stage 1 through stage 4 chronic kidney disease, or unspecified chronic kidney disease: Secondary | ICD-10-CM | POA: Diagnosis not present

## 2020-08-20 DIAGNOSIS — C7910 Secondary malignant neoplasm of unspecified urinary organs: Secondary | ICD-10-CM

## 2020-08-20 DIAGNOSIS — K219 Gastro-esophageal reflux disease without esophagitis: Secondary | ICD-10-CM | POA: Diagnosis not present

## 2020-08-20 DIAGNOSIS — Z79899 Other long term (current) drug therapy: Secondary | ICD-10-CM | POA: Diagnosis not present

## 2020-08-20 DIAGNOSIS — N189 Chronic kidney disease, unspecified: Secondary | ICD-10-CM | POA: Diagnosis not present

## 2020-08-20 DIAGNOSIS — C787 Secondary malignant neoplasm of liver and intrahepatic bile duct: Secondary | ICD-10-CM | POA: Diagnosis not present

## 2020-08-20 DIAGNOSIS — F17210 Nicotine dependence, cigarettes, uncomplicated: Secondary | ICD-10-CM | POA: Diagnosis not present

## 2020-08-20 LAB — CBC WITH DIFFERENTIAL/PLATELET
Abs Immature Granulocytes: 0.02 10*3/uL (ref 0.00–0.07)
Basophils Absolute: 0 10*3/uL (ref 0.0–0.1)
Basophils Relative: 0 %
Eosinophils Absolute: 0.1 10*3/uL (ref 0.0–0.5)
Eosinophils Relative: 1 %
HCT: 31.1 % — ABNORMAL LOW (ref 39.0–52.0)
Hemoglobin: 10 g/dL — ABNORMAL LOW (ref 13.0–17.0)
Immature Granulocytes: 0 %
Lymphocytes Relative: 12 %
Lymphs Abs: 0.8 10*3/uL (ref 0.7–4.0)
MCH: 27.7 pg (ref 26.0–34.0)
MCHC: 32.2 g/dL (ref 30.0–36.0)
MCV: 86.1 fL (ref 80.0–100.0)
Monocytes Absolute: 0.6 10*3/uL (ref 0.1–1.0)
Monocytes Relative: 8 %
Neutro Abs: 5.4 10*3/uL (ref 1.7–7.7)
Neutrophils Relative %: 79 %
Platelets: 364 10*3/uL (ref 150–400)
RBC: 3.61 MIL/uL — ABNORMAL LOW (ref 4.22–5.81)
RDW: 13.3 % (ref 11.5–15.5)
WBC: 6.9 10*3/uL (ref 4.0–10.5)
nRBC: 0 % (ref 0.0–0.2)

## 2020-08-20 LAB — COMPREHENSIVE METABOLIC PANEL
ALT: 14 U/L (ref 0–44)
AST: 20 U/L (ref 15–41)
Albumin: 3.4 g/dL — ABNORMAL LOW (ref 3.5–5.0)
Alkaline Phosphatase: 53 U/L (ref 38–126)
Anion gap: 7 (ref 5–15)
BUN: 12 mg/dL (ref 8–23)
CO2: 25 mmol/L (ref 22–32)
Calcium: 8.7 mg/dL — ABNORMAL LOW (ref 8.9–10.3)
Chloride: 103 mmol/L (ref 98–111)
Creatinine, Ser: 1.14 mg/dL (ref 0.61–1.24)
GFR, Estimated: >60 mL/min (ref >60)
Glucose, Bld: 166 mg/dL — ABNORMAL HIGH (ref 70–99)
Potassium: 4 mmol/L (ref 3.5–5.1)
Sodium: 135 mmol/L (ref 135–145)
Total Bilirubin: 0.5 mg/dL (ref 0.3–1.2)
Total Protein: 6.9 g/dL (ref 6.5–8.1)

## 2020-08-20 LAB — TSH: TSH: 0.606 u[IU]/mL (ref 0.350–4.500)

## 2020-08-20 MED ORDER — SODIUM CHLORIDE 0.9 % IV SOLN: Freq: Once | INTRAVENOUS | Status: AC

## 2020-08-20 MED ORDER — SODIUM CHLORIDE 0.9 % IV SOLN
200.0000 mg | Freq: Once | INTRAVENOUS | Status: AC
  Administered 2020-08-20: 200 mg via INTRAVENOUS
  Filled 2020-08-20: qty 8

## 2020-08-20 NOTE — Patient Instructions (Signed)
[facility]  Discharge Instructions: Thank you for choosing [facility] to provide your oncology and hematology care.  If you have a lab appointment with the Cancer Center, please come in thru the Main Entrance and check in at the main information desk.  Wear comfortable clothing and clothing appropriate for easy access to any Portacath or PICC line.   We strive to give you quality time with your provider. You may need to reschedule your appointment if you arrive late (15 or more minutes).  Arriving late affects you and other patients whose appointments are after yours.  Also, if you miss three or more appointments without notifying the office, you may be dismissed from the clinic at the provider's discretion.      For prescription refill requests, have your pharmacy contact our office and allow 72 hours for refills to be completed.    Today you received the following chemotherapy and/or immunotherapy agents    To help prevent nausea and vomiting after your treatment, we encourage you to take your nausea medication as directed.  BELOW ARE SYMPTOMS THAT SHOULD BE REPORTED IMMEDIATELY: . *FEVER GREATER THAN 100.4 F (38 C) OR HIGHER . *CHILLS OR SWEATING . *NAUSEA AND VOMITING THAT IS NOT CONTROLLED WITH YOUR NAUSEA MEDICATION . *UNUSUAL SHORTNESS OF BREATH . *UNUSUAL BRUISING OR BLEEDING . *URINARY PROBLEMS (pain or burning when urinating, or frequent urination) . *BOWEL PROBLEMS (unusual diarrhea, constipation, pain near the anus) . TENDERNESS IN MOUTH AND THROAT WITH OR WITHOUT PRESENCE OF ULCERS (sore throat, sores in mouth, or a toothache) . UNUSUAL RASH, SWELLING OR PAIN  . UNUSUAL VAGINAL DISCHARGE OR ITCHING   Items with * indicate a potential emergency and should be followed up as soon as possible or go to the Emergency Department if any problems should occur.  Please show the CHEMOTHERAPY ALERT CARD or IMMUNOTHERAPY ALERT CARD at check-in to the  Emergency Department and triage nurse.  Should you have questions after your visit or need to cancel or reschedule your appointment, please contact [facility] [phone]  and follow the prompts.  Office hours are 8:00 a.m. to 4:30 p.m. Monday - Friday. Please note that voicemails left after 4:00 p.m. may not be returned until the following business day.  We are closed weekends and major holidays. You have access to a nurse at all times for urgent questions. Please call the main number to the clinic [phone] and follow the prompts.  For any non-urgent questions, you may also contact your provider using MyChart. We now offer e-Visits for anyone 18 and older to request care online for non-urgent symptoms. For details visit [email or web address].   Also download the MyChart app! Go to the app store, search "MyChart", open the app, select [facility], and log in with your MyChart username and password.  Due to Covid, a mask is required upon entering the hospital/clinic. If you do not have a mask, one will be given to you upon arrival. For doctor visits, patients may have 1 support person aged 18 or older with them. For treatment visits, patients cannot have anyone with them due to current Covid guidelines and our immunocompromised population.  

## 2020-08-20 NOTE — Progress Notes (Signed)
Patient presents today for treatment D1 C1 Keytruda. Labs within parameters for treatment . Patient has no complaints today of any changes since his last visit.   Treatment given today per MD orders. Tolerated infusion without adverse affects. Vital signs stable. No complaints at this time. Discharged from clinic ambulatory in stable condition. Alert and oriented x 3. F/U with [facility] as scheduled.

## 2020-08-20 NOTE — Progress Notes (Unsigned)
Immunotherapy education packet given and discussed with pt in detail.  Discussed diagnosis, staging, tx regimen, and intent of tx.  Reviewed immunotherapy medications and side effects.  Instructed on how to manage side effects at home, and when to call the clinic.  Importance of fever/chills discussed with pt and family. Discussed precautions to implement at home after receiving tx, as well as self care strategies. Phone numbers provided for clinic during regular working hours, also how to reach the clinic after hours and on weekends. Pt provided the opportunity to ask questions - all questions answered to pt's satisfaction.     

## 2020-08-21 LAB — T4: T4, Total: 5.8 ug/dL (ref 4.5–12.0)

## 2020-08-21 NOTE — Progress Notes (Signed)
24 hour call back patient denies any side effects from [facility]. Patient states , " I feel good today and my pain is better." Patient instructed to call the clinic with any concerns or changes.

## 2020-08-26 ENCOUNTER — Other Ambulatory Visit (HOSPITAL_COMMUNITY): Payer: Self-pay

## 2020-08-26 MED ORDER — HYDROCODONE-ACETAMINOPHEN 10-325 MG PO TABS: 1.0000 | ORAL_TABLET | Freq: Four times a day (QID) | ORAL | 0 refills | Status: DC | PRN

## 2020-09-01 ENCOUNTER — Other Ambulatory Visit: Payer: Self-pay | Admitting: *Deleted

## 2020-09-01 MED ORDER — TELMISARTAN 20 MG PO TABS: 20.0000 mg | ORAL_TABLET | Freq: Every day | ORAL | 1 refills | Status: DC

## 2020-09-09 NOTE — Progress Notes (Signed)
Jeremy Rodriguez, [place] [address]   CLINIC:  Medical Oncology/Hematology  PCP:  Lindell Spar, MD [address] / [place] [address] [phone]   REASON FOR VISIT:  Follow-up for metastatic urothelial carcinoma  PRIOR THERAPY: none  NGS Results: not done  CURRENT THERAPY: under work-up  BRIEF ONCOLOGIC HISTORY:  Oncology History  Metastatic urothelial carcinoma ([place])  08/13/2020 Initial Diagnosis   Metastatic urothelial carcinoma ([facility])   08/13/2020 Cancer Staging   Staging form: Urinary Bladder, AJCC 8th Edition - Clinical stage from 08/13/2020: Stage IVB (cTX, cN0, pM1b) - Signed by Derek Jack, MD on 08/13/2020 Stage prefix: Initial diagnosis WHO/ISUP grade (low/high): High Grade Histologic grading system: 2 grade system   08/20/2020 -  Chemotherapy    Patient is on Treatment Plan: BLADDER PEMBROLIZUMAB Q21D        CANCER STAGING: Cancer Staging Metastatic urothelial carcinoma ([facility]) Staging form: Urinary Bladder, AJCC 8th Edition - Clinical stage from 08/13/2020: Stage IVB (cTX, cN0, pM1b) - Signed by Derek Jack, MD on 08/13/2020   INTERVAL HISTORY:  [name], a 70 y.o. male, returns for routine follow-up and consideration for next cycle of immunotherapy. Rudolf was last seen on 09/09/2020.  Due for cycle #2 of [place] today.   Overall, he tells me he has been feeling pretty well. He denies rash or diarrhea. He reports occasional constipation, and he takes ibuprofen and alka seltzer prn. He takes 2 ibuprofen in the morning and 2 at night. He reports that his left side pain is unchanged and is not sharp. He has no sleep disturbances. He takes colace BID, but this has not helped the constipation. He also tried Miralax which has also not helped. He has not tried milk of magnesia. He takes norco BID, but does not always take it daily as he reports it worsens his constipation. He reports good  appetite and denies any weight loss.   Overall, he feels ready for next cycle of immunotherapy today.   REVIEW OF SYSTEMS:  Review of Systems  Constitutional:  Negative for appetite change, fatigue and unexpected weight change.  Gastrointestinal:  Positive for abdominal pain (L side) and constipation. Negative for diarrhea.  Skin:  Negative for rash.  All other systems reviewed and are negative.  PAST MEDICAL/SURGICAL HISTORY:  Past Medical History:  Diagnosis Date   Chronic kidney disease    GERD (gastroesophageal reflux disease)    Hypertension    Medical history non-contributory    Past Surgical History:  Procedure Laterality Date   appendectomy      SOCIAL HISTORY:  Social History   Socioeconomic History   Marital status: Single    Spouse name: Not on file   Number of children: Not on file   Years of education: Not on file   Highest education level: Not on file  Occupational History   Not on file  Tobacco Use   Smoking status: Current Some Day Smoker   Smokeless tobacco: Never Used   Tobacco comment: a few cigarettes a day  Substance and Sexual Activity   Alcohol use: Yes    Comment: occ   Drug use: Never   Sexual activity: Not on file  Other Topics Concern   Not on file  Social History Narrative   Not on file   Social Determinants of Health   Financial Resource Strain: Low Risk    Difficulty of Paying Living Expenses: Not very hard  Food Insecurity: No  Food Insecurity   Worried About Charity fundraiser in the Last Year: Never true   Ran Out of Food in the Last Year: Never true  Transportation Needs: No Transportation Needs   Lack of Transportation (Medical): No   Lack of Transportation (Non-Medical): No  Physical Activity: Inactive   Days of Exercise per Week: 0 days   Minutes of Exercise per Session: 0 min  Stress: No Stress Concern Present   Feeling of Stress : Not at all  Social Connections: Unknown   Frequency of Communication with Friends  and Family: Twice a week   Frequency of Social Gatherings with Friends and Family: Twice a week   Attends Religious Services: Not on Electrical engineer or Organizations: Not on file   Attends Archivist Meetings: Not on file   Marital Status: Divorced  Intimate Partner Violence: Not on file    FAMILY HISTORY:  Family History  Problem Relation Age of Onset   Colon cancer Neg Hx    Colon polyps Neg Hx    Liver cancer Neg Hx    Pancreatic cancer Neg Hx     CURRENT MEDICATIONS:  Current Outpatient Medications  Medication Sig Dispense Refill   HYDROcodone-acetaminophen (NORCO) 10-325 MG tablet Take 1 tablet by mouth every 6 (six) hours as needed. 60 tablet 0   amLODipine (NORVASC) 5 MG tablet Take 1 tablet (5 mg total) by mouth daily. 90 tablet 1   docusate sodium (COLACE) 100 MG capsule Take 1 capsule (100 mg total) by mouth daily as needed for mild constipation. 30 capsule 0   linaclotide (LINZESS) 145 MCG CAPS capsule Take 1 capsule (145 mcg total) by mouth daily before breakfast. 30 capsule 5   oxyCODONE-acetaminophen (PERCOCET/ROXICET) 5-325 MG tablet Take 1 tablet by mouth every 12 (twelve) hours as needed for severe pain. 60 tablet 0   pantoprazole (PROTONIX) 40 MG tablet Take 1 tablet (40 mg total) by mouth daily. 30 tablet 3   Pembrolizumab (KEYTRUDA IV) Inject 200 mg into the vein every 21 ( twenty-one) days.     telmisartan (MICARDIS) 20 MG tablet Take 1 tablet (20 mg total) by mouth daily. 90 tablet 1   No current facility-administered medications for this visit.    ALLERGIES:  No Known Allergies  PHYSICAL EXAM:  Performance status (ECOG): 1 - Symptomatic but completely ambulatory  There were no vitals filed for this visit. Wt Readings from Last 3 Encounters:  08/20/20 152 lb 12.8 oz (69.3 kg)  08/13/20 152 lb 1.9 oz (69 kg)  08/07/20 152 lb (68.9 kg)   Physical Exam Vitals reviewed.  Constitutional:      Appearance: Normal appearance.   Cardiovascular:     Rate and Rhythm: Normal rate and regular rhythm.     Pulses: Normal pulses.     Heart sounds: Normal heart sounds.  Pulmonary:     Effort: Pulmonary effort is normal.     Breath sounds: Normal breath sounds.  Abdominal:     Palpations: There is no hepatomegaly, splenomegaly or mass.     Tenderness: There is abdominal tenderness in the left upper quadrant.  Neurological:     General: No focal deficit present.     Mental Status: He is alert and oriented to person, place, and time.  Psychiatric:        Mood and Affect: Mood normal.        Behavior: Behavior normal.    LABORATORY DATA:  I have  reviewed the labs as listed.  CBC Latest Ref Rng & Units 08/20/2020 08/07/2020 07/15/2020  WBC 4.0 - 10.5 K/uL 6.9 7.6 8.1  Hemoglobin 13.0 - 17.0 g/dL 10.0(L) 13.1 13.9  Hematocrit 39.0 - 52.0 % 31.1(L) 39.8 42.5  Platelets 150 - 400 K/uL 364 302 330   CMP Latest Ref Rng & Units 08/20/2020 07/15/2020 07/10/2020  Glucose 70 - 99 mg/dL 166(H) 94 -  BUN 8 - 23 mg/dL 12 10 -  Creatinine 0.61 - 1.24 mg/dL 1.14 1.13 1.00  Sodium 135 - 145 mmol/L 135 140 -  Potassium 3.5 - 5.1 mmol/L 4.0 4.9 -  Chloride 98 - 111 mmol/L 103 102 -  CO2 22 - 32 mmol/L 25 22 -  Calcium 8.9 - 10.3 mg/dL 8.7(L) 9.7 -  Total Protein 6.5 - 8.1 g/dL 6.9 7.5 -  Total Bilirubin 0.3 - 1.2 mg/dL 0.5 0.5 -  Alkaline Phos 38 - 126 U/L 53 68 -  AST 15 - 41 U/L 20 16 -  ALT 0 - 44 U/L 14 10 -    DIAGNOSTIC IMAGING:  I have independently reviewed the scans and discussed with the patient. No results found.   ASSESSMENT:  1. 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 - 20 pound weight loss in the last 4 months due to decreased appetite.  Left upper quadrant pain for the past 2 to 3 months. - CTAP with contrast on 07/10/2020 showed ill-defined low-density mass in the right hepatic lobe measuring 2 x  2 cm.  Left upper pole kidney mass measuring 7.6 x 5.9 cm with encasement and narrowing of the left renal artery and vein.  No intravascular thrombus noted.  Mass encases the left adrenal gland.  Small right adrenal nodule measuring 11 mm, likely benign.  No other evidence of metastatic disease. - MRI of the liver with and without contrast on 07/20/2020 shows 2.2 x 1.7 cm enhancing lesion in the medial aspect of the segment 7, suspicious for metastasis.  Left renal mass, 6 x 7.6 x 6.9 cm infiltrating enhancing left upper pole renal mass, extending into the left renal sinus.  Possible TCC or lymphoma rather than renal cell carcinoma. - CT chest on 07/20/2020 with no evidence of metastatic disease. - PET scan on 08/04/2020 showed large hypermetabolic left upper pole mass and 2 hypermetabolic lesions in the right hepatic lobe favoring metastatic disease.  No hypermetabolic adenopathy.  - Liver biopsy showed poorly differentiated metastatic urothelial carcinoma.  IHC positive for CK5/6, CK7, GATA3, p40 and patchy positivity with p63 and PAX8. - He refused any chemotherapy. - Pembrolizumab started on 08/20/2020.  2.  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 - Reports exposure to carbon black dust.  Smoked half pack per day for 30 years, currently smoking 2 to 3 cigarettes/day. - No family history of malignancy.   PLAN:  1. Metastatic poorly differentiated urothelial carcinoma arising in the renal pelvis: - He has tolerated first cycle of pembrolizumab reasonably well. - He denied any immunotherapy related side effects.  He did have some itching of the skin. - Reviewed labs today which showed normal LFTs.  CBC was grossly normal.  TSH was 1.486. - Proceed with cycle 2 pembrolizumab today.  Plan to repeat scans after 4 cycles. - Discussed with him importance of checking for mutations by NGS testing to avail further  treatment options.  He is agreeable.  We  will send Caris testing.   2.  Left upper quadrant pain: - He is currently taking 2 ibuprofen twice daily.  He also takes hydrocodone twice daily and reports severe constipation.  Pain is stable at this time.  3.  Constipation: - He is taking 2 stool softeners daily which he will continue. - We will start him on lactulose 30-60 mL in 1-2 divided doses as needed.    Orders placed this encounter:  No orders of the defined types were placed in this encounter.    Derek Jack, MD [place] [phone]   I, Thana Ates, am acting as a scribe for Dr. Derek Jack.  I, Derek Jack MD, have reviewed the above documentation for accuracy and completeness, and I agree with the above.

## 2020-09-10 ENCOUNTER — Other Ambulatory Visit: Payer: Self-pay

## 2020-09-10 ENCOUNTER — Inpatient Hospital Stay (HOSPITAL_COMMUNITY): Payer: Medicare Other

## 2020-09-10 ENCOUNTER — Ambulatory Visit (HOSPITAL_COMMUNITY): Payer: Medicare Other | Admitting: Hematology

## 2020-09-10 ENCOUNTER — Inpatient Hospital Stay (HOSPITAL_COMMUNITY): Payer: Medicare Other | Attending: Hematology | Admitting: Hematology

## 2020-09-10 VITALS — BP 117/59 | HR 81 | Temp 97.9°F | Resp 16 | Wt 150.8 lb

## 2020-09-10 VITALS — BP 125/63 | HR 65 | Temp 97.6°F | Resp 18

## 2020-09-10 DIAGNOSIS — C787 Secondary malignant neoplasm of liver and intrahepatic bile duct: Secondary | ICD-10-CM | POA: Insufficient documentation

## 2020-09-10 DIAGNOSIS — Z720 Tobacco use: Secondary | ICD-10-CM | POA: Diagnosis not present

## 2020-09-10 DIAGNOSIS — K5900 Constipation, unspecified: Secondary | ICD-10-CM | POA: Insufficient documentation

## 2020-09-10 DIAGNOSIS — C7910 Secondary malignant neoplasm of unspecified urinary organs: Secondary | ICD-10-CM

## 2020-09-10 DIAGNOSIS — C679 Malignant neoplasm of bladder, unspecified: Secondary | ICD-10-CM | POA: Diagnosis not present

## 2020-09-10 DIAGNOSIS — F17210 Nicotine dependence, cigarettes, uncomplicated: Secondary | ICD-10-CM | POA: Insufficient documentation

## 2020-09-10 DIAGNOSIS — R109 Unspecified abdominal pain: Secondary | ICD-10-CM | POA: Diagnosis not present

## 2020-09-10 DIAGNOSIS — C652 Malignant neoplasm of left renal pelvis: Secondary | ICD-10-CM | POA: Diagnosis not present

## 2020-09-10 DIAGNOSIS — Z79899 Other long term (current) drug therapy: Secondary | ICD-10-CM | POA: Diagnosis not present

## 2020-09-10 DIAGNOSIS — Z5112 Encounter for antineoplastic immunotherapy: Secondary | ICD-10-CM | POA: Diagnosis not present

## 2020-09-10 LAB — CBC WITH DIFFERENTIAL/PLATELET
Abs Immature Granulocytes: 0.01 10*3/uL (ref 0.00–0.07)
Basophils Absolute: 0.1 10*3/uL (ref 0.0–0.1)
Basophils Relative: 1 %
Eosinophils Absolute: 0.1 10*3/uL (ref 0.0–0.5)
Eosinophils Relative: 2 %
HCT: 33.1 % — ABNORMAL LOW (ref 39.0–52.0)
Hemoglobin: 10.3 g/dL — ABNORMAL LOW (ref 13.0–17.0)
Immature Granulocytes: 0 %
Lymphocytes Relative: 23 %
Lymphs Abs: 1.4 10*3/uL (ref 0.7–4.0)
MCH: 25.6 pg — ABNORMAL LOW (ref 26.0–34.0)
MCHC: 31.1 g/dL (ref 30.0–36.0)
MCV: 82.3 fL (ref 80.0–100.0)
Monocytes Absolute: 0.8 10*3/uL (ref 0.1–1.0)
Monocytes Relative: 13 %
Neutro Abs: 3.7 10*3/uL (ref 1.7–7.7)
Neutrophils Relative %: 61 %
Platelets: 357 10*3/uL (ref 150–400)
RBC: 4.02 MIL/uL — ABNORMAL LOW (ref 4.22–5.81)
RDW: 13.3 % (ref 11.5–15.5)
WBC: 6 10*3/uL (ref 4.0–10.5)
nRBC: 0 % (ref 0.0–0.2)

## 2020-09-10 LAB — COMPREHENSIVE METABOLIC PANEL
ALT: 14 U/L (ref 0–44)
AST: 17 U/L (ref 15–41)
Albumin: 3.5 g/dL (ref 3.5–5.0)
Alkaline Phosphatase: 63 U/L (ref 38–126)
Anion gap: 6 (ref 5–15)
BUN: 13 mg/dL (ref 8–23)
CO2: 25 mmol/L (ref 22–32)
Calcium: 8.8 mg/dL — ABNORMAL LOW (ref 8.9–10.3)
Chloride: 105 mmol/L (ref 98–111)
Creatinine, Ser: 0.99 mg/dL (ref 0.61–1.24)
GFR, Estimated: >60 mL/min (ref >60)
Glucose, Bld: 134 mg/dL — ABNORMAL HIGH (ref 70–99)
Potassium: 4.7 mmol/L (ref 3.5–5.1)
Sodium: 136 mmol/L (ref 135–145)
Total Bilirubin: 0.5 mg/dL (ref 0.3–1.2)
Total Protein: 7.1 g/dL (ref 6.5–8.1)

## 2020-09-10 LAB — TSH: TSH: 1.486 u[IU]/mL (ref 0.350–4.500)

## 2020-09-10 MED ORDER — SODIUM CHLORIDE 0.9 % IV SOLN: Freq: Once | INTRAVENOUS | Status: AC

## 2020-09-10 MED ORDER — LACTULOSE 20 GM/30ML PO SOLN
30.0000 mL | Freq: Every day | ORAL | 6 refills | Status: DC
Start: 2020-09-10 — End: 2021-10-13

## 2020-09-10 MED ORDER — SODIUM CHLORIDE 0.9 % IV SOLN
200.0000 mg | Freq: Once | INTRAVENOUS | Status: AC
  Administered 2020-09-10: 200 mg via INTRAVENOUS
  Filled 2020-09-10: qty 8

## 2020-09-10 NOTE — Progress Notes (Signed)
Ok to treat patient today per MD.          Treatment given per orders. Patient tolerated it well without problems. Vitals stable and discharged home from clinic ambulatory. Follow up as scheduled.

## 2020-09-10 NOTE — Progress Notes (Signed)
Caris testing requested per Dr. Katragadda 

## 2020-09-10 NOTE — Patient Instructions (Addendum)
[facility] at [facility] Discharge Instructions  You were seen today by Dr. Katragadda. He went over your recent results, and you received your treatment. Dr. Katragadda will see you back in 3 weeks for labs and follow up.   Thank you for choosing [facility] at [facility] to provide your oncology and hematology care.  To afford each patient quality time with our provider, please arrive at least 15 minutes before your scheduled appointment time.   If you have a lab appointment with the Cancer Center please come in thru the Main Entrance and check in at the main information desk  You need to re-schedule your appointment should you arrive 10 or more minutes late.  We strive to give you quality time with our providers, and arriving late affects you and other patients whose appointments are after yours.  Also, if you no show three or more times for appointments you may be dismissed from the clinic at the providers discretion.     Again, thank you for choosing [facility].  Our hope is that these requests will decrease the amount of time that you wait before being seen by our physicians.       _____________________________________________________________  Should you have questions after your visit to [facility], please contact our office at [phone] between the hours of 8:00 a.m. and 4:30 p.m.  Voicemails left after 4:00 p.m. will not be returned until the following business day.  For prescription refill requests, have your pharmacy contact our office and allow 72 hours.    Cancer Center Support Programs:   > Cancer Support Group  2nd Tuesday of the month 1pm-2pm, Journey Room   

## 2020-09-10 NOTE — Patient Instructions (Signed)
[facility]  Discharge Instructions: Thank you for choosing [facility] to provide your oncology and hematology care.  If you have a lab appointment with the Cancer Center, please come in thru the Main Entrance and check in at the main information desk.  Wear comfortable clothing and clothing appropriate for easy access to any Portacath or PICC line.   We strive to give you quality time with your provider. You may need to reschedule your appointment if you arrive late (15 or more minutes).  Arriving late affects you and other patients whose appointments are after yours.  Also, if you miss three or more appointments without notifying the office, you may be dismissed from the clinic at the provider's discretion.      For prescription refill requests, have your pharmacy contact our office and allow 72 hours for refills to be completed.    Today you received the following chemotherapy and/or immunotherapy agents    To help prevent nausea and vomiting after your treatment, we encourage you to take your nausea medication as directed.  BELOW ARE SYMPTOMS THAT SHOULD BE REPORTED IMMEDIATELY: . *FEVER GREATER THAN 100.4 F (38 C) OR HIGHER . *CHILLS OR SWEATING . *NAUSEA AND VOMITING THAT IS NOT CONTROLLED WITH YOUR NAUSEA MEDICATION . *UNUSUAL SHORTNESS OF BREATH . *UNUSUAL BRUISING OR BLEEDING . *URINARY PROBLEMS (pain or burning when urinating, or frequent urination) . *BOWEL PROBLEMS (unusual diarrhea, constipation, pain near the anus) . TENDERNESS IN MOUTH AND THROAT WITH OR WITHOUT PRESENCE OF ULCERS (sore throat, sores in mouth, or a toothache) . UNUSUAL RASH, SWELLING OR PAIN  . UNUSUAL VAGINAL DISCHARGE OR ITCHING   Items with * indicate a potential emergency and should be followed up as soon as possible or go to the Emergency Department if any problems should occur.  Please show the CHEMOTHERAPY ALERT CARD or IMMUNOTHERAPY ALERT CARD at check-in to the  Emergency Department and triage nurse.  Should you have questions after your visit or need to cancel or reschedule your appointment, please contact [facility] [phone]  and follow the prompts.  Office hours are 8:00 a.m. to 4:30 p.m. Monday - Friday. Please note that voicemails left after 4:00 p.m. may not be returned until the following business day.  We are closed weekends and major holidays. You have access to a nurse at all times for urgent questions. Please call the main number to the clinic [phone] and follow the prompts.  For any non-urgent questions, you may also contact your provider using MyChart. We now offer e-Visits for anyone 18 and older to request care online for non-urgent symptoms. For details visit [email or web address].   Also download the MyChart app! Go to the app store, search "MyChart", open the app, select [facility], and log in with your MyChart username and password.  Due to Covid, a mask is required upon entering the hospital/clinic. If you do not have a mask, one will be given to you upon arrival. For doctor visits, patients may have 1 support person aged 18 or older with them. For treatment visits, patients cannot have anyone with them due to current Covid guidelines and our immunocompromised population.  

## 2020-09-10 NOTE — Progress Notes (Signed)
Patient has been assessed, vital signs and labs have been reviewed by Dr. Katragadda. ANC, Creatinine, LFTs, and Platelets are within treatment parameters per Dr. Katragadda. The patient is good to proceed with treatment at this time. Primary RN and pharmacy aware.  

## 2020-09-11 LAB — T4: T4, Total: 6.1 ug/dL (ref 4.5–12.0)

## 2020-09-20 ENCOUNTER — Other Ambulatory Visit (HOSPITAL_COMMUNITY): Payer: Self-pay

## 2020-09-21 ENCOUNTER — Encounter (HOSPITAL_COMMUNITY): Payer: Self-pay | Admitting: Hematology

## 2020-09-21 ENCOUNTER — Other Ambulatory Visit (HOSPITAL_COMMUNITY): Payer: Self-pay | Admitting: Surgery

## 2020-09-21 ENCOUNTER — Encounter (HOSPITAL_COMMUNITY): Payer: Self-pay | Admitting: Surgery

## 2020-09-21 DIAGNOSIS — C652 Malignant neoplasm of left renal pelvis: Secondary | ICD-10-CM

## 2020-09-21 DIAGNOSIS — C7910 Secondary malignant neoplasm of unspecified urinary organs: Secondary | ICD-10-CM

## 2020-09-21 MED ORDER — HYDROCODONE-ACETAMINOPHEN 10-325 MG PO TABS: 1.0000 | ORAL_TABLET | Freq: Four times a day (QID) | ORAL | 0 refills | Status: DC | PRN

## 2020-09-21 NOTE — Telephone Encounter (Signed)
Hydrocodone/acetaminophen filled by Tarri Abernethy, PA in another encounter.

## 2020-10-01 ENCOUNTER — Inpatient Hospital Stay (HOSPITAL_COMMUNITY): Payer: Medicare Other

## 2020-10-01 ENCOUNTER — Other Ambulatory Visit: Payer: Self-pay | Admitting: Hematology and Oncology

## 2020-10-01 ENCOUNTER — Other Ambulatory Visit (HOSPITAL_COMMUNITY): Payer: Self-pay | Admitting: *Deleted

## 2020-10-01 ENCOUNTER — Other Ambulatory Visit: Payer: Self-pay

## 2020-10-01 ENCOUNTER — Ambulatory Visit (HOSPITAL_COMMUNITY): Payer: Medicare Other | Admitting: Hematology and Oncology

## 2020-10-01 VITALS — BP 156/78 | HR 89 | Temp 98.1°F | Resp 18 | Wt 150.4 lb

## 2020-10-01 DIAGNOSIS — C7910 Secondary malignant neoplasm of unspecified urinary organs: Secondary | ICD-10-CM

## 2020-10-01 DIAGNOSIS — C652 Malignant neoplasm of left renal pelvis: Secondary | ICD-10-CM | POA: Diagnosis not present

## 2020-10-01 DIAGNOSIS — R109 Unspecified abdominal pain: Secondary | ICD-10-CM | POA: Diagnosis not present

## 2020-10-01 DIAGNOSIS — F17210 Nicotine dependence, cigarettes, uncomplicated: Secondary | ICD-10-CM | POA: Diagnosis not present

## 2020-10-01 DIAGNOSIS — C787 Secondary malignant neoplasm of liver and intrahepatic bile duct: Secondary | ICD-10-CM | POA: Diagnosis not present

## 2020-10-01 DIAGNOSIS — Z720 Tobacco use: Secondary | ICD-10-CM | POA: Diagnosis not present

## 2020-10-01 DIAGNOSIS — K5900 Constipation, unspecified: Secondary | ICD-10-CM | POA: Diagnosis not present

## 2020-10-01 DIAGNOSIS — Z5112 Encounter for antineoplastic immunotherapy: Secondary | ICD-10-CM | POA: Diagnosis not present

## 2020-10-01 DIAGNOSIS — C679 Malignant neoplasm of bladder, unspecified: Secondary | ICD-10-CM | POA: Diagnosis not present

## 2020-10-01 DIAGNOSIS — Z79899 Other long term (current) drug therapy: Secondary | ICD-10-CM | POA: Diagnosis not present

## 2020-10-01 LAB — CBC WITH DIFFERENTIAL/PLATELET
Abs Immature Granulocytes: 0.03 10*3/uL (ref 0.00–0.07)
Basophils Absolute: 0 10*3/uL (ref 0.0–0.1)
Basophils Relative: 1 %
Eosinophils Absolute: 0.2 10*3/uL (ref 0.0–0.5)
Eosinophils Relative: 2 %
HCT: 32 % — ABNORMAL LOW (ref 39.0–52.0)
Hemoglobin: 9.7 g/dL — ABNORMAL LOW (ref 13.0–17.0)
Immature Granulocytes: 0 %
Lymphocytes Relative: 16 %
Lymphs Abs: 1.1 10*3/uL (ref 0.7–4.0)
MCH: 24 pg — ABNORMAL LOW (ref 26.0–34.0)
MCHC: 30.3 g/dL (ref 30.0–36.0)
MCV: 79.2 fL — ABNORMAL LOW (ref 80.0–100.0)
Monocytes Absolute: 0.8 10*3/uL (ref 0.1–1.0)
Monocytes Relative: 11 %
Neutro Abs: 4.8 10*3/uL (ref 1.7–7.7)
Neutrophils Relative %: 70 %
Platelets: 357 10*3/uL (ref 150–400)
RBC: 4.04 MIL/uL — ABNORMAL LOW (ref 4.22–5.81)
RDW: 14.2 % (ref 11.5–15.5)
WBC: 6.9 10*3/uL (ref 4.0–10.5)
nRBC: 0 % (ref 0.0–0.2)

## 2020-10-01 LAB — COMPREHENSIVE METABOLIC PANEL
ALT: 15 U/L (ref 0–44)
AST: 18 U/L (ref 15–41)
Albumin: 3.4 g/dL — ABNORMAL LOW (ref 3.5–5.0)
Alkaline Phosphatase: 66 U/L (ref 38–126)
Anion gap: 8 (ref 5–15)
BUN: 11 mg/dL (ref 8–23)
CO2: 24 mmol/L (ref 22–32)
Calcium: 8.9 mg/dL (ref 8.9–10.3)
Chloride: 104 mmol/L (ref 98–111)
Creatinine, Ser: 1.03 mg/dL (ref 0.61–1.24)
GFR, Estimated: >60 mL/min (ref >60)
Glucose, Bld: 145 mg/dL — ABNORMAL HIGH (ref 70–99)
Potassium: 3.9 mmol/L (ref 3.5–5.1)
Sodium: 136 mmol/L (ref 135–145)
Total Bilirubin: 0.5 mg/dL (ref 0.3–1.2)
Total Protein: 6.6 g/dL (ref 6.5–8.1)

## 2020-10-01 LAB — TSH: TSH: 2.398 u[IU]/mL (ref 0.350–4.500)

## 2020-10-01 MED ORDER — SODIUM CHLORIDE 0.9 % IV SOLN
200.0000 mg | Freq: Once | INTRAVENOUS | Status: AC
  Administered 2020-10-01: 200 mg via INTRAVENOUS
  Filled 2020-10-01: qty 8

## 2020-10-01 MED ORDER — HYDROCODONE-ACETAMINOPHEN 10-325 MG PO TABS: 1.0000 | ORAL_TABLET | Freq: Four times a day (QID) | ORAL | 0 refills | Status: DC | PRN

## 2020-10-01 MED ORDER — SODIUM CHLORIDE 0.9 % IV SOLN: Freq: Once | INTRAVENOUS | Status: AC

## 2020-10-01 NOTE — Patient Instructions (Signed)
[facility]  Discharge Instructions: Thank you for choosing [facility] to provide your oncology and hematology care.  If you have a lab appointment with the Cancer Center, please come in thru the Main Entrance and check in at the main information desk.  Wear comfortable clothing and clothing appropriate for easy access to any Portacath or PICC line.   We strive to give you quality time with your provider. You may need to reschedule your appointment if you arrive late (15 or more minutes).  Arriving late affects you and other patients whose appointments are after yours.  Also, if you miss three or more appointments without notifying the office, you may be dismissed from the clinic at the provider's discretion.      For prescription refill requests, have your pharmacy contact our office and allow 72 hours for refills to be completed.        To help prevent nausea and vomiting after your treatment, we encourage you to take your nausea medication as directed.  BELOW ARE SYMPTOMS THAT SHOULD BE REPORTED IMMEDIATELY: *FEVER GREATER THAN 100.4 F (38 C) OR HIGHER *CHILLS OR SWEATING *NAUSEA AND VOMITING THAT IS NOT CONTROLLED WITH YOUR NAUSEA MEDICATION *UNUSUAL SHORTNESS OF BREATH *UNUSUAL BRUISING OR BLEEDING *URINARY PROBLEMS (pain or burning when urinating, or frequent urination) *BOWEL PROBLEMS (unusual diarrhea, constipation, pain near the anus) TENDERNESS IN MOUTH AND THROAT WITH OR WITHOUT PRESENCE OF ULCERS (sore throat, sores in mouth, or a toothache) UNUSUAL RASH, SWELLING OR PAIN  UNUSUAL VAGINAL DISCHARGE OR ITCHING   Items with * indicate a potential emergency and should be followed up as soon as possible or go to the Emergency Department if any problems should occur.  Please show the CHEMOTHERAPY ALERT CARD or IMMUNOTHERAPY ALERT CARD at check-in to the Emergency Department and triage nurse.  Should you have questions after your visit or need to cancel  or reschedule your appointment, please contact [facility] [phone]  and follow the prompts.  Office hours are 8:00 a.m. to 4:30 p.m. Monday - Friday. Please note that voicemails left after 4:00 p.m. may not be returned until the following business day.  We are closed weekends and major holidays. You have access to a nurse at all times for urgent questions. Please call the main number to the clinic [phone] and follow the prompts.  For any non-urgent questions, you may also contact your provider using MyChart. We now offer e-Visits for anyone 18 and older to request care online for non-urgent symptoms. For details visit [email or web address].   Also download the MyChart app! Go to the app store, search "MyChart", open the app, select [facility], and log in with your MyChart username and password.  Due to Covid, a mask is required upon entering the hospital/clinic. If you do not have a mask, one will be given to you upon arrival. For doctor visits, patients may have 1 support person aged 18 or older with them. For treatment visits, patients cannot have anyone with them due to current Covid guidelines and our immunocompromised population.  

## 2020-10-01 NOTE — Progress Notes (Signed)
Labs reviewed with MD today. Dr. Chryl Heck. Will proceed with treatment per MD.   Treatment given per orders. Patient tolerated it well without problems. Vitals stable and discharged home from clinic ambulatory. Follow up as scheduled.

## 2020-10-02 LAB — T4: T4, Total: 6.1 ug/dL (ref 4.5–12.0)

## 2020-10-04 ENCOUNTER — Other Ambulatory Visit: Payer: Self-pay | Admitting: Internal Medicine

## 2020-10-04 ENCOUNTER — Encounter (HOSPITAL_COMMUNITY): Payer: Self-pay

## 2020-10-04 DIAGNOSIS — K5900 Constipation, unspecified: Secondary | ICD-10-CM

## 2020-10-04 DIAGNOSIS — R140 Abdominal distension (gaseous): Secondary | ICD-10-CM

## 2020-10-06 ENCOUNTER — Other Ambulatory Visit (HOSPITAL_COMMUNITY): Payer: Self-pay | Admitting: *Deleted

## 2020-10-06 ENCOUNTER — Other Ambulatory Visit: Payer: Self-pay

## 2020-10-06 ENCOUNTER — Telehealth: Payer: Self-pay

## 2020-10-06 DIAGNOSIS — R140 Abdominal distension (gaseous): Secondary | ICD-10-CM

## 2020-10-06 DIAGNOSIS — K219 Gastro-esophageal reflux disease without esophagitis: Secondary | ICD-10-CM

## 2020-10-06 MED ORDER — PANTOPRAZOLE SODIUM 40 MG PO TBEC: 40.0000 mg | DELAYED_RELEASE_TABLET | Freq: Every day | ORAL | 3 refills | Status: DC

## 2020-10-06 MED ORDER — DICYCLOMINE HCL 10 MG PO CAPS: 10.0000 mg | ORAL_CAPSULE | Freq: Three times a day (TID) | ORAL | 0 refills | Status: DC | PRN

## 2020-10-06 MED ORDER — DOCUSATE SODIUM 100 MG PO CAPS: 100.0000 mg | ORAL_CAPSULE | Freq: Every day | ORAL | 0 refills | Status: DC | PRN

## 2020-10-06 NOTE — Telephone Encounter (Signed)
Needs med refill dicyclomine (BENTYL) 10 MG capsule  Pharmacy: [facility] [place]  Patient said he deleted out of his mychart could not send in for a refill  Also need refill on pantoprazole (PROTONIX) 40 MG tablet

## 2020-10-06 NOTE — Telephone Encounter (Signed)
Refills sent

## 2020-10-08 ENCOUNTER — Encounter (HOSPITAL_COMMUNITY): Payer: Self-pay

## 2020-10-15 ENCOUNTER — Telehealth (HOSPITAL_COMMUNITY): Payer: Self-pay | Admitting: *Deleted

## 2020-10-15 ENCOUNTER — Other Ambulatory Visit: Payer: Self-pay | Admitting: Internal Medicine

## 2020-10-15 DIAGNOSIS — R109 Unspecified abdominal pain: Secondary | ICD-10-CM

## 2020-10-15 DIAGNOSIS — R140 Abdominal distension (gaseous): Secondary | ICD-10-CM

## 2020-10-15 MED ORDER — DICYCLOMINE HCL 10 MG PO CAPS: 10.0000 mg | ORAL_CAPSULE | Freq: Three times a day (TID) | ORAL | 0 refills | Status: DC | PRN

## 2020-10-15 NOTE — Telephone Encounter (Signed)
Daughter called to make [place] aware that patient is experiencing abdominal pain and tightness in his stomach.  States he has not had a normal bowel movement in several days.  Per Dr Delton Coombes, would like to obtain an abdominal x-ray, which patient was not inclined to do, therefore advised to increase lactulose dose to every 3 hours until he has a good BM.  Verbalized understanding.

## 2020-10-16 ENCOUNTER — Other Ambulatory Visit: Payer: Self-pay | Admitting: Internal Medicine

## 2020-10-16 DIAGNOSIS — K219 Gastro-esophageal reflux disease without esophagitis: Secondary | ICD-10-CM

## 2020-10-18 ENCOUNTER — Encounter (HOSPITAL_COMMUNITY): Payer: Self-pay

## 2020-10-19 ENCOUNTER — Other Ambulatory Visit (HOSPITAL_COMMUNITY): Payer: Self-pay | Admitting: *Deleted

## 2020-10-21 NOTE — Progress Notes (Signed)
[place], [place] [address]   CLINIC:  Medical Oncology/Hematology  PCP:  Lindell Spar, MD [address] / [place] [address] [phone]   REASON FOR VISIT:  Follow-up for metastatic urothelial carcinoma  PRIOR THERAPY: none  NGS Results: not done  CURRENT THERAPY: Pembrolizumab q21d  BRIEF ONCOLOGIC HISTORY:  Oncology History  Metastatic urothelial carcinoma ([facility])  08/13/2020 Initial Diagnosis   Metastatic urothelial carcinoma ([facility])    08/13/2020 Cancer Staging   Staging form: Urinary Bladder, AJCC 8th Edition - Clinical stage from 08/13/2020: Stage IVB (cTX, cN0, pM1b) - Signed by Derek Jack, MD on 08/13/2020  Stage prefix: Initial diagnosis  WHO/ISUP grade (low/high): High Grade  Histologic grading system: 2 grade system    08/20/2020 -  Chemotherapy    Patient is on Treatment Plan: BLADDER PEMBROLIZUMAB Q21D         CANCER STAGING: Cancer Staging Metastatic urothelial carcinoma ([facility]) Staging form: Urinary Bladder, AJCC 8th Edition - Clinical stage from 08/13/2020: Stage IVB (cTX, cN0, pM1b) - Signed by Derek Jack, MD on 08/13/2020  Virtual Visit via Video Note  I connected with Debbe Odea on $[email or web address]$ @ at 10:45 AM EDT by a video enabled telemedicine application and verified that I am speaking with the correct person using two identifiers.  Location: Patient: clinic Provider: home  Others present: Renda Rolls, RN   I discussed the limitations of evaluation and management by telemedicine and the availability of in person appointments. The patient expressed understanding and agreed to proceed.  INTERVAL HISTORY:  Mr. Ewen Varnell, a 70 y.o. male, returns for routine follow-up and consideration for next cycle of chemotherapy. Prithvi was last seen on 09/10/20.  Due for cycle #4 of Keytruda today.   Overall, he tells me he has been feeling pretty well. He reports fair  appetite, constipation, bloating, and pain on his lower right abdomen that is stable. He is taking hydrocodone BID which has helped with his pain, and he is taking colace TID which has not helped with his constipation. He denies any new or severe fatigue, cough, and SOB.   Overall, he feels ready for next cycle of chemo today.   REVIEW OF SYSTEMS:  Review of Systems  Constitutional:  Negative for appetite change and fatigue.  Respiratory:  Negative for cough and shortness of breath.   Gastrointestinal:  Positive for abdominal pain (R side) and constipation.   PAST MEDICAL/SURGICAL HISTORY:  Past Medical History:  Diagnosis Date   Chronic kidney disease    GERD (gastroesophageal reflux disease)    Hypertension    Medical history non-contributory    Past Surgical History:  Procedure Laterality Date   appendectomy      SOCIAL HISTORY:  Social History   Socioeconomic History   Marital status: Single    Spouse name: Not on file   Number of children: Not on file   Years of education: Not on file   Highest education level: Not on file  Occupational History   Not on file  Tobacco Use   Smoking status: Some Days   Smokeless tobacco: Never   Tobacco comments:    a few cigarettes a day  Substance and Sexual Activity   Alcohol use: Yes    Comment: occ   Drug use: Never   Sexual activity: Not on file  Other Topics Concern   Not on file  Social History Narrative   Not on file   Social Determinants of  Health   Financial Resource Strain: Low Risk    Difficulty of Paying Living Expenses: Not very hard  Food Insecurity: No Food Insecurity   Worried About Running Out of Food in the Last Year: Never true   Ran Out of Food in the Last Year: Never true  Transportation Needs: No Transportation Needs   Lack of Transportation (Medical): No   Lack of Transportation (Non-Medical): No  Physical Activity: Inactive   Days of Exercise per Week: 0 days   Minutes of Exercise per Session:  0 min  Stress: No Stress Concern Present   Feeling of Stress : Not at all  Social Connections: Unknown   Frequency of Communication with Friends and Family: Twice a week   Frequency of Social Gatherings with Friends and Family: Twice a week   Attends Religious Services: Not on Electrical engineer or Organizations: Not on file   Attends Archivist Meetings: Not on file   Marital Status: Divorced  Intimate Partner Violence: Not on file    FAMILY HISTORY:  Family History  Problem Relation Age of Onset   Colon cancer Neg Hx    Colon polyps Neg Hx    Liver cancer Neg Hx    Pancreatic cancer Neg Hx     CURRENT MEDICATIONS:  Current Outpatient Medications  Medication Sig Dispense Refill   amLODipine (NORVASC) 5 MG tablet Take 1 tablet (5 mg total) by mouth daily. (Patient not taking: Reported on 09/10/2020) 90 tablet 1   dicyclomine (BENTYL) 10 MG capsule Take 1 capsule (10 mg total) by mouth 3 (three) times daily as needed for spasms. 30 capsule 0   docusate sodium (COLACE) 100 MG capsule Take 1 capsule (100 mg total) by mouth daily as needed for mild constipation. 30 capsule 0   HYDROcodone-acetaminophen (NORCO) 10-325 MG tablet Take 1 tablet by mouth every 6 (six) hours as needed. 120 tablet 0   Lactulose 20 GM/30ML SOLN Take 30 mLs (20 g total) by mouth daily. 473 mL 6   linaclotide (LINZESS) 145 MCG CAPS capsule Take 1 capsule (145 mcg total) by mouth daily before breakfast. (Patient not taking: Reported on 09/10/2020) 30 capsule 5   pantoprazole (PROTONIX) 40 MG tablet Take 1 tablet (40 mg total) by mouth daily. 30 tablet 3   Pembrolizumab (KEYTRUDA IV) Inject 200 mg into the vein every 21 ( twenty-one) days.     telmisartan (MICARDIS) 20 MG tablet Take 1 tablet (20 mg total) by mouth daily. (Patient not taking: Reported on 09/10/2020) 90 tablet 1   No current facility-administered medications for this visit.    ALLERGIES:  No Known Allergies  Performance status  (ECOG): 1 - Symptomatic but completely ambulatory  There were no vitals filed for this visit. Wt Readings from Last 3 Encounters:  10/01/20 150 lb 6.4 oz (68.2 kg)  09/10/20 150 lb 12.8 oz (68.4 kg)  08/20/20 152 lb 12.8 oz (69.3 kg)   LABORATORY DATA:  I have reviewed the labs as listed.  CBC Latest Ref Rng & Units 10/01/2020 09/10/2020 08/20/2020  WBC 4.0 - 10.5 K/uL 6.9 6.0 6.9  Hemoglobin 13.0 - 17.0 g/dL 9.7(L) 10.3(L) 10.0(L)  Hematocrit 39.0 - 52.0 % 32.0(L) 33.1(L) 31.1(L)  Platelets 150 - 400 K/uL 357 357 364   CMP Latest Ref Rng & Units 10/01/2020 09/10/2020 08/20/2020  Glucose 70 - 99 mg/dL 145(H) 134(H) 166(H)  BUN 8 - 23 mg/dL _0 Creatinine 0.61 - 1.24 mg/dL  1.03 0.99 1.14  Sodium 135 - 145 mmol/L 136 136 135  Potassium 3.5 - 5.1 mmol/L 3.9 4.7 4.0  Chloride 98 - 111 mmol/L 104 105 103  CO2 22 - 32 mmol/L _0 Calcium 8.9 - 10.3 mg/dL 8.9 8.8(L) 8.7(L)  Total Protein 6.5 - 8.1 g/dL 6.6 7.1 6.9  Total Bilirubin 0.3 - 1.2 mg/dL 0.5 0.5 0.5  Alkaline Phos 38 - 126 U/L 66 63 53  AST 15 - 41 U/L _1 ALT 0 - 44 U/L _2 DIAGNOSTIC IMAGING:  I have independently reviewed the scans and discussed with the patient. No results found.   ASSESSMENT:  1. 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 - 20 pound weight loss in the last 4 months due to decreased appetite.  Left upper quadrant pain for the past 2 to 3 months. - CTAP with contrast on 07/10/2020 showed ill-defined low-density mass in the right hepatic lobe measuring 2 x 2 cm.  Left upper pole kidney mass measuring 7.6 x 5.9 cm with encasement and narrowing of the left renal artery and vein.  No intravascular thrombus noted.  Mass encases the left adrenal gland.  Small right adrenal nodule measuring 11 mm, likely benign.  No other evidence of metastatic disease. - MRI of the liver with and  without contrast on 07/20/2020 shows 2.2 x 1.7 cm enhancing lesion in the medial aspect of the segment 7, suspicious for metastasis.  Left renal mass, 6 x 7.6 x 6.9 cm infiltrating enhancing left upper pole renal mass, extending into the left renal sinus.  Possible TCC or lymphoma rather than renal cell carcinoma. - CT chest on 07/20/2020 with no evidence of metastatic disease. - PET scan on 08/04/2020 showed large hypermetabolic left upper pole mass and 2 hypermetabolic lesions in the right hepatic lobe favoring metastatic disease.  No hypermetabolic adenopathy.  - Liver biopsy showed poorly differentiated metastatic urothelial carcinoma.  IHC positive for CK5/6, CK7, GATA3, p40 and patchy positivity with p63 and PAX8. - He refused any chemotherapy. - Pembrolizumab started on 08/20/2020.   2.  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 - Reports exposure to carbon black dust.  Smoked half pack per day for 30 years, currently smoking 2 to 3 cigarettes/day. - No family history of malignancy.  PLAN:  1. Metastatic poorly differentiated urothelial carcinoma arising in the renal pelvis: -we reviewed results of NGS testing which showed benefit from pembrolizumab.  No mutations involving FGFR, NTRK was found.  They reported having not enough cancer cells to run the whole exon sequencing. - We reviewed labs today which showed normal LFTs and creatinine.  Electrolytes were normal.  CBC was grossly normal. - He will proceed with cycle 4 of Keytruda today. - RTC 3 weeks.  I plan to do CT CAP prior to next visit to evaluate response.   2.  Left upper quadrant pain: -He is having to take hydrocodone twice daily most days and sometimes 3 times a day. - He is also continuing ibuprofen 2 tablets twice daily. - Pain has not improved or deteriorated since the start of Keytruda.  3.  Constipation: -He is taking 2 Colace tablets daily. - He reports that he is  also taking lactulose 30 mL twice daily.  He is still constipated. - He reportedly  used Linzess in the past and has not continued as the co-pay was very high. - As he is also on opioids, I have recommended Movantik 25 mg daily.  We will send prescription.  4.  Normocytic to microcytic anemia: - His hemoglobin today is 9.8 with MCV of 77. 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  We will also check his ferritin and iron panel.   Orders placed this encounter:  No orders of the defined types were placed in this encounter.  I provided 30 minutes of non-face-to-face time during this encounter.  Derek Jack, MD [place] [phone]   I, Thana Ates, am acting as a scribe for Dr. Derek Jack.  I, Derek Jack MD, have reviewed the above documentation for accuracy and completeness, and I agree with the above.

## 2020-10-22 ENCOUNTER — Other Ambulatory Visit: Payer: Self-pay

## 2020-10-22 ENCOUNTER — Inpatient Hospital Stay (HOSPITAL_COMMUNITY): Payer: Medicare Other | Attending: Hematology

## 2020-10-22 ENCOUNTER — Encounter (HOSPITAL_COMMUNITY): Payer: Self-pay | Admitting: Hematology

## 2020-10-22 ENCOUNTER — Inpatient Hospital Stay (HOSPITAL_COMMUNITY): Payer: Medicare Other

## 2020-10-22 ENCOUNTER — Other Ambulatory Visit (HOSPITAL_COMMUNITY): Payer: Self-pay | Admitting: *Deleted

## 2020-10-22 ENCOUNTER — Inpatient Hospital Stay (HOSPITAL_BASED_OUTPATIENT_CLINIC_OR_DEPARTMENT_OTHER): Payer: Medicare Other | Admitting: Hematology

## 2020-10-22 VITALS — BP 144/75 | HR 65 | Temp 97.3°F | Resp 18 | Wt 146.0 lb

## 2020-10-22 DIAGNOSIS — C7910 Secondary malignant neoplasm of unspecified urinary organs: Secondary | ICD-10-CM

## 2020-10-22 DIAGNOSIS — R1012 Left upper quadrant pain: Secondary | ICD-10-CM | POA: Diagnosis not present

## 2020-10-22 DIAGNOSIS — K5900 Constipation, unspecified: Secondary | ICD-10-CM | POA: Diagnosis not present

## 2020-10-22 DIAGNOSIS — D509 Iron deficiency anemia, unspecified: Secondary | ICD-10-CM | POA: Insufficient documentation

## 2020-10-22 DIAGNOSIS — C652 Malignant neoplasm of left renal pelvis: Secondary | ICD-10-CM | POA: Diagnosis not present

## 2020-10-22 DIAGNOSIS — N189 Chronic kidney disease, unspecified: Secondary | ICD-10-CM | POA: Diagnosis not present

## 2020-10-22 DIAGNOSIS — Z5112 Encounter for antineoplastic immunotherapy: Secondary | ICD-10-CM | POA: Diagnosis not present

## 2020-10-22 DIAGNOSIS — C669 Malignant neoplasm of unspecified ureter: Secondary | ICD-10-CM | POA: Insufficient documentation

## 2020-10-22 DIAGNOSIS — Z9049 Acquired absence of other specified parts of digestive tract: Secondary | ICD-10-CM | POA: Diagnosis not present

## 2020-10-22 DIAGNOSIS — I10 Essential (primary) hypertension: Secondary | ICD-10-CM | POA: Diagnosis not present

## 2020-10-22 DIAGNOSIS — Z79899 Other long term (current) drug therapy: Secondary | ICD-10-CM | POA: Insufficient documentation

## 2020-10-22 LAB — CBC WITH DIFFERENTIAL/PLATELET
Abs Immature Granulocytes: 0.02 10*3/uL (ref 0.00–0.07)
Basophils Absolute: 0 10*3/uL (ref 0.0–0.1)
Basophils Relative: 1 %
Eosinophils Absolute: 0.1 10*3/uL (ref 0.0–0.5)
Eosinophils Relative: 2 %
HCT: 32.4 % — ABNORMAL LOW (ref 39.0–52.0)
Hemoglobin: 9.8 g/dL — ABNORMAL LOW (ref 13.0–17.0)
Immature Granulocytes: 0 %
Lymphocytes Relative: 20 %
Lymphs Abs: 1.3 10*3/uL (ref 0.7–4.0)
MCH: 23.3 pg — ABNORMAL LOW (ref 26.0–34.0)
MCHC: 30.2 g/dL (ref 30.0–36.0)
MCV: 77 fL — ABNORMAL LOW (ref 80.0–100.0)
Monocytes Absolute: 0.8 10*3/uL (ref 0.1–1.0)
Monocytes Relative: 11 %
Neutro Abs: 4.3 10*3/uL (ref 1.7–7.7)
Neutrophils Relative %: 66 %
Platelets: 382 10*3/uL (ref 150–400)
RBC: 4.21 MIL/uL — ABNORMAL LOW (ref 4.22–5.81)
RDW: 15.5 % (ref 11.5–15.5)
WBC: 6.6 10*3/uL (ref 4.0–10.5)
nRBC: 0 % (ref 0.0–0.2)

## 2020-10-22 LAB — COMPREHENSIVE METABOLIC PANEL
ALT: 15 U/L (ref 0–44)
AST: 16 U/L (ref 15–41)
Albumin: 3.5 g/dL (ref 3.5–5.0)
Alkaline Phosphatase: 63 U/L (ref 38–126)
Anion gap: 9 (ref 5–15)
BUN: 14 mg/dL (ref 8–23)
CO2: 24 mmol/L (ref 22–32)
Calcium: 8.9 mg/dL (ref 8.9–10.3)
Chloride: 101 mmol/L (ref 98–111)
Creatinine, Ser: 1.02 mg/dL (ref 0.61–1.24)
GFR, Estimated: >60 mL/min (ref >60)
Glucose, Bld: 125 mg/dL — ABNORMAL HIGH (ref 70–99)
Potassium: 4.2 mmol/L (ref 3.5–5.1)
Sodium: 134 mmol/L — ABNORMAL LOW (ref 135–145)
Total Bilirubin: 0.5 mg/dL (ref 0.3–1.2)
Total Protein: 7.2 g/dL (ref 6.5–8.1)

## 2020-10-22 LAB — TSH: TSH: 1.829 u[IU]/mL (ref 0.350–4.500)

## 2020-10-22 MED ORDER — NALOXEGOL OXALATE 25 MG PO TABS: 25.0000 mg | ORAL_TABLET | Freq: Every day | ORAL | 3 refills | Status: DC

## 2020-10-22 MED ORDER — SODIUM CHLORIDE 0.9 % IV SOLN: Freq: Once | INTRAVENOUS | Status: AC

## 2020-10-22 MED ORDER — SODIUM CHLORIDE 0.9 % IV SOLN
200.0000 mg | Freq: Once | INTRAVENOUS | Status: AC
  Administered 2020-10-22: 200 mg via INTRAVENOUS
  Filled 2020-10-22: qty 8

## 2020-10-22 NOTE — Patient Instructions (Signed)
[facility]  Discharge Instructions: Thank you for choosing [facility] to provide your oncology and hematology care.  If you have a lab appointment with the Cancer Center, please come in thru the Main Entrance and check in at the main information desk.  Wear comfortable clothing and clothing appropriate for easy access to any Portacath or PICC line.   We strive to give you quality time with your provider. You may need to reschedule your appointment if you arrive late (15 or more minutes).  Arriving late affects you and other patients whose appointments are after yours.  Also, if you miss three or more appointments without notifying the office, you may be dismissed from the clinic at the provider's discretion.      For prescription refill requests, have your pharmacy contact our office and allow 72 hours for refills to be completed.    Today you received the following chemotherapy and/or immunotherapy agents Keytruda       To help prevent nausea and vomiting after your treatment, we encourage you to take your nausea medication as directed.  BELOW ARE SYMPTOMS THAT SHOULD BE REPORTED IMMEDIATELY: *FEVER GREATER THAN 100.4 F (38 C) OR HIGHER *CHILLS OR SWEATING *NAUSEA AND VOMITING THAT IS NOT CONTROLLED WITH YOUR NAUSEA MEDICATION *UNUSUAL SHORTNESS OF BREATH *UNUSUAL BRUISING OR BLEEDING *URINARY PROBLEMS (pain or burning when urinating, or frequent urination) *BOWEL PROBLEMS (unusual diarrhea, constipation, pain near the anus) TENDERNESS IN MOUTH AND THROAT WITH OR WITHOUT PRESENCE OF ULCERS (sore throat, sores in mouth, or a toothache) UNUSUAL RASH, SWELLING OR PAIN  UNUSUAL VAGINAL DISCHARGE OR ITCHING   Items with * indicate a potential emergency and should be followed up as soon as possible or go to the Emergency Department if any problems should occur.  Please show the CHEMOTHERAPY ALERT CARD or IMMUNOTHERAPY ALERT CARD at check-in to the Emergency  Department and triage nurse.  Should you have questions after your visit or need to cancel or reschedule your appointment, please contact [facility] [phone]  and follow the prompts.  Office hours are 8:00 a.m. to 4:30 p.m. Monday - Friday. Please note that voicemails left after 4:00 p.m. may not be returned until the following business day.  We are closed weekends and major holidays. You have access to a nurse at all times for urgent questions. Please call the main number to the clinic [phone] and follow the prompts.  For any non-urgent questions, you may also contact your provider using MyChart. We now offer e-Visits for anyone 18 and older to request care online for non-urgent symptoms. For details visit [email or web address].   Also download the MyChart app! Go to the app store, search "MyChart", open the app, select [facility], and log in with your MyChart username and password.  Due to Covid, a mask is required upon entering the hospital/clinic. If you do not have a mask, one will be given to you upon arrival. For doctor visits, patients may have 1 support person aged 18 or older with them. For treatment visits, patients cannot have anyone with them due to current Covid guidelines and our immunocompromised population.  

## 2020-10-22 NOTE — Patient Instructions (Addendum)
[place] at [facility] Discharge Instructions  You were seen today by Dr. Delton Coombes. He went over your recent results, and your received your treatment. You will be scheduled for a CT scan of your chest, abdomen, and pelvis prior to your next treatment. Dr. Delton Coombes will see you back in 3 weeks for labs and follow up.   Thank you for choosing [place] at [facility] to provide your oncology and hematology care.  To afford each patient quality time with our provider, please arrive at least 15 minutes before your scheduled appointment time.   If you have a lab appointment with the [facility] please come in thru the Main Entrance and check in at the main information desk  You need to re-schedule your appointment should you arrive 10 or more minutes late.  We strive to give you quality time with our providers, and arriving late affects you and other patients whose appointments are after yours.  Also, if you no show three or more times for appointments you may be dismissed from the clinic at the providers discretion.     Again, thank you for choosing [facility].  Our hope is that these requests will decrease the amount of time that you wait before being seen by our physicians.       _____________________________________________________________  Should you have questions after your visit to [facility], please contact our office at [phone] between the hours of 8:00 a.m. and 4:30 p.m.  Voicemails left after 4:00 p.m. will not be returned until the following business day.  For prescription refill requests, have your pharmacy contact our office and allow 72 hours.    Cancer Center Support Programs:   > Cancer Support Group  2nd Tuesday of the month 1pm-2pm, Journey Room

## 2020-10-22 NOTE — Addendum Note (Signed)
Addended by: Renda Rolls A on: 10/22/2020 11:40 AM   Modules accepted: Orders

## 2020-10-22 NOTE — Progress Notes (Signed)
Patient has been assessed, vital signs and labs have been reviewed by Dr. Katragadda. ANC, Creatinine, LFTs, and Platelets are within treatment parameters per Dr. Katragadda. The patient is good to proceed with treatment at this time. Primary RN and pharmacy aware.  

## 2020-10-22 NOTE — Progress Notes (Signed)
  Patient presents today for treatment and virtual visit with Dr. Delton Coombes. Labs within parameters for treatment. Message received from [facility] / Dr. Delton Coombes to proceed with treatment. Vital signs within parameters for treatment.   Treatment given today per MD orders. Tolerated infusion without adverse affects. Vital signs stable. No complaints at this time. Discharged from clinic ambulatory in stable condition. Alert and oriented x 3. F/U with [facility] as scheduled.

## 2020-10-23 LAB — T4: T4, Total: 6.7 ug/dL (ref 4.5–12.0)

## 2020-11-03 ENCOUNTER — Other Ambulatory Visit (HOSPITAL_COMMUNITY): Payer: Self-pay | Admitting: Hematology

## 2020-11-03 ENCOUNTER — Other Ambulatory Visit (HOSPITAL_COMMUNITY): Payer: Self-pay | Admitting: *Deleted

## 2020-11-03 ENCOUNTER — Telehealth (HOSPITAL_COMMUNITY): Payer: Self-pay | Admitting: *Deleted

## 2020-11-03 MED ORDER — OXYCODONE HCL 15 MG PO TABA: 1.0000 | ORAL_TABLET | Freq: Four times a day (QID) | ORAL | 0 refills | Status: DC | PRN

## 2020-11-03 NOTE — Telephone Encounter (Signed)
Received TC from daughter [place] stating that he has lost 8 pounds in the last week.  Eating very little, as he has no appetite.  Denies nausea or vomiting.  Additionally, his pain has worsened since visit on 7/21 and states hydrocodone is no longer working for him.  Would like something to increase appetite and an alternative pain medication.

## 2020-11-03 NOTE — Telephone Encounter (Signed)
Per Dr Delton Coombes, script sent for Oxycodone 15 mg Q 6 hours prn and Megace 10 ml bid.  Daughter aware and verbalized understanding.

## 2020-11-04 ENCOUNTER — Other Ambulatory Visit (HOSPITAL_COMMUNITY): Payer: Self-pay | Admitting: *Deleted

## 2020-11-04 ENCOUNTER — Encounter (HOSPITAL_COMMUNITY): Payer: Self-pay

## 2020-11-04 MED ORDER — OXYCODONE HCL 15 MG PO TABS: 15.0000 mg | ORAL_TABLET | Freq: Four times a day (QID) | ORAL | 0 refills | Status: DC | PRN

## 2020-11-05 ENCOUNTER — Other Ambulatory Visit (HOSPITAL_COMMUNITY): Payer: Self-pay | Admitting: *Deleted

## 2020-11-05 MED ORDER — MEGESTROL ACETATE 400 MG/10ML PO SUSP: 400.0000 mg | Freq: Every day | ORAL | 0 refills | Status: DC

## 2020-11-06 ENCOUNTER — Ambulatory Visit: Payer: Medicare Other

## 2020-11-09 ENCOUNTER — Ambulatory Visit: Payer: Medicare Other

## 2020-11-09 ENCOUNTER — Other Ambulatory Visit: Payer: Self-pay

## 2020-11-09 ENCOUNTER — Ambulatory Visit (INDEPENDENT_AMBULATORY_CARE_PROVIDER_SITE_OTHER): Payer: Medicare Other | Admitting: *Deleted

## 2020-11-09 DIAGNOSIS — Z0000 Encounter for general adult medical examination without abnormal findings: Secondary | ICD-10-CM | POA: Diagnosis not present

## 2020-11-09 NOTE — Progress Notes (Signed)
Subjective:   Jeremy Rodriguez is a 70 y.o. male who presents for Medicare Annual/Subsequent preventive examination.  I connected with  Jeremy Rodriguez on 11/09/20 by an audio  enabled telemedicine application and verified that I am speaking with the correct person using two identifiers.   I discussed the limitations, risks, security and privacy concerns of performing an evaluation and management service by telephone and the availability of in person appointments. I also discussed with the patient that there may be a patient responsible charge related to this service. The patient expressed understanding and verbally consented to this telephonic visit.   Review of Systems           Objective:    There were no vitals filed for this visit. There is no height or weight on file to calculate BMI.  Advanced Directives 10/22/2020 09/10/2020 08/20/2020 08/13/2020 08/07/2020 07/22/2020  Does Patient Have a Medical Advance Directive? No No No No No No  Would patient like information on creating a medical advance directive? No - Patient declined No - Patient declined No - Patient declined No - Patient declined No - Patient declined No - Patient declined    Current Medications (verified) Outpatient Encounter Medications as of 11/09/2020  Medication Sig   amLODipine (NORVASC) 5 MG tablet Take 1 tablet (5 mg total) by mouth daily. (Patient not taking: No sig reported)   dicyclomine (BENTYL) 10 MG capsule Take 1 capsule (10 mg total) by mouth 3 (three) times daily as needed for spasms.   docusate sodium (COLACE) 100 MG capsule Take 1 capsule (100 mg total) by mouth daily as needed for mild constipation.   HYDROcodone-acetaminophen (NORCO) 10-325 MG tablet Take 1 tablet by mouth every 6 (six) hours as needed.   Lactulose 20 GM/30ML SOLN Take 30 mLs (20 g total) by mouth daily.   megestrol (MEGACE) 400 MG/10ML suspension Take 10 mLs (400 mg total) by mouth daily.   naloxegol oxalate (MOVANTIK) 25 MG TABS  tablet Take 1 tablet (25 mg total) by mouth daily.   oxyCODONE (ROXICODONE) 15 MG immediate release tablet Take 1 tablet (15 mg total) by mouth every 6 (six) hours as needed for pain.   pantoprazole (PROTONIX) 40 MG tablet Take 1 tablet (40 mg total) by mouth daily.   Pembrolizumab (KEYTRUDA IV) Inject 200 mg into the vein every 21 ( twenty-one) days.   telmisartan (MICARDIS) 20 MG tablet Take 1 tablet (20 mg total) by mouth daily. (Patient not taking: No sig reported)   No facility-administered encounter medications on file as of 11/09/2020.    Allergies (verified) Patient has no known allergies.   History: Past Medical History:  Diagnosis Date   Chronic kidney disease    GERD (gastroesophageal reflux disease)    Hypertension    Medical history non-contributory    Past Surgical History:  Procedure Laterality Date   appendectomy     Family History  Problem Relation Age of Onset   Colon cancer Neg Hx    Colon polyps Neg Hx    Liver cancer Neg Hx    Pancreatic cancer Neg Hx    Social History   Socioeconomic History   Marital status: Single    Spouse name: Not on file   Number of children: Not on file   Years of education: Not on file   Highest education level: Not on file  Occupational History   Not on file  Tobacco Use   Smoking status: Some Days   Smokeless tobacco: Never  Tobacco comments:    a few cigarettes a day  Substance and Sexual Activity   Alcohol use: Yes    Comment: occ   Drug use: Never   Sexual activity: Not on file  Other Topics Concern   Not on file  Social History Narrative   Not on file   Social Determinants of Health   Financial Resource Strain: Low Risk    Difficulty of Paying Living Expenses: Not very hard  Food Insecurity: No Food Insecurity   Worried About Running Out of Food in the Last Year: Never true   Ran Out of Food in the Last Year: Never true  Transportation Needs: No Transportation Needs   Lack of Transportation (Medical):  No   Lack of Transportation (Non-Medical): No  Physical Activity: Inactive   Days of Exercise per Week: 0 days   Minutes of Exercise per Session: 0 min  Stress: No Stress Concern Present   Feeling of Stress : Not at all  Social Connections: Unknown   Frequency of Communication with Friends and Family: Twice a week   Frequency of Social Gatherings with Friends and Family: Twice a week   Attends Religious Services: Not on Electrical engineer or Organizations: Not on file   Attends Archivist Meetings: Not on file   Marital Status: Divorced    Tobacco Counseling Ready to quit: Not Answered Counseling given: Not Answered Tobacco comments: a few cigarettes a day   Clinical Intake:                 Diabetic?no         Activities of Daily Living In your present state of health, do you have any difficulty performing the following activities: 08/07/2020 06/24/2020  Hearing? N N  Vision? N N  Difficulty concentrating or making decisions? - N  Walking or climbing stairs? N N  Dressing or bathing? N N  Doing errands, shopping? - N  Some recent data might be hidden    Patient Care Team: Lindell Spar, MD as PCP - General (Internal Medicine) Eloise Harman, DO as Consulting Physician (Internal Medicine) Brien Mates, RN as Oncology Nurse Navigator (Oncology)  Indicate any recent Medical Services you may have received from other than [facility] providers in the past year (date may be approximate).     Assessment:   This is a routine wellness examination for Jeremy Rodriguez.  Hearing/Vision screen No results found.  Dietary issues and exercise activities discussed:     Goals Addressed   None   Depression Screen PHQ 2/9 Scores 07/15/2020 07/01/2020 06/24/2020  PHQ - 2 Score 0 0 0    Fall Risk Fall Risk  07/15/2020 07/01/2020 06/24/2020  Falls in the past year? 0 0 0  Number falls in past yr: 0 0 0  Injury with Fall? 0 0 0  Risk for fall due to :  No Fall Risks No Fall Risks No Fall Risks  Follow up Falls evaluation completed Falls evaluation completed Falls evaluation completed    [place]:  Any stairs in or around the home? Yes  If so, are there any without handrails? Yes  Home free of loose throw rugs in walkways, pet beds, electrical cords, etc? Yes  Adequate lighting in your home to reduce risk of falls? Yes   ASSISTIVE DEVICES UTILIZED TO PREVENT FALLS:  Life alert? No  Use of a cane, walker or w/c? No  Grab  bars in the bathroom? No  Shower chair or bench in shower? No  Elevated toilet seat or a handicapped toilet? No   TIMED UP AND GO:  Was the test performed? No .  Length of time to ambulate 10 feet: NA sec.    Cognitive Function:        Immunizations Immunization History  Administered Date(s) Administered   [employer] Vaccination 05/17/2019, 06/14/2019    TDAP status: Due, Education has been provided regarding the importance of this vaccine. Advised may receive this vaccine at local pharmacy or Health Dept. Aware to provide a copy of the vaccination record if obtained from local pharmacy or Health Dept. Verbalized acceptance and understanding.  Flu Vaccine status: Due, Education has been provided regarding the importance of this vaccine. Advised may receive this vaccine at local pharmacy or Health Dept. Aware to provide a copy of the vaccination record if obtained from local pharmacy or Health Dept. Verbalized acceptance and understanding.  Pneumococcal vaccine status: Due, Education has been provided regarding the importance of this vaccine. Advised may receive this vaccine at local pharmacy or Health Dept. Aware to provide a copy of the vaccination record if obtained from local pharmacy or Health Dept. Verbalized acceptance and understanding.  Covid-19 vaccine status: Completed vaccines  Qualifies for Shingles Vaccine? Yes   Zostavax completed No   Shingrix  Completed?: No.    Education has been provided regarding the importance of this vaccine. Patient has been advised to call insurance company to determine out of pocket expense if they have not yet received this vaccine. Advised may also receive vaccine at local pharmacy or Health Dept. Verbalized acceptance and understanding.  Screening Tests Health Maintenance  Topic Date Due   Zoster Vaccines- Shingrix (1 of 2) Never done   COVID-19 Vaccine (3 - Moderna risk series) 07/12/2019   INFLUENZA VACCINE  11/02/2020   COLONOSCOPY (Pts 45-21yr Insurance coverage will need to be confirmed)  07/15/2021 (Originally 01/24/1996)   TETANUS/TDAP  07/15/2021 (Originally 01/23/1970)   PNA vac Low Risk Adult (1 of 2 - PCV13) 07/15/2021 (Originally 01/24/2016)   Hepatitis C Screening  Completed   HPV VACCINES  Aged Out    Health Maintenance  Health Maintenance Due  Topic Date Due   Zoster Vaccines- Shingrix (1 of 2) Never done   COVID-19 Vaccine (3 - Moderna risk series) 07/12/2019   INFLUENZA VACCINE  11/02/2020    Colonoscopy: Patient will wait has too much going on right now   Lung Cancer Screening: (Low Dose CT Chest recommended if Age 70-80years, 30 pack-year currently smoking OR have quit w/in 15years.) does not qualify.   Lung Cancer Screening Referral: NA  Additional Screening:  Hepatitis C Screening: does qualify; Completed 07-15-20  Vision Screening: Recommended annual ophthalmology exams for early detection of glaucoma and other disorders of the eye. Is the patient up to date with their annual eye exam?  No  Who is the provider or what is the name of the office in which the patient attends annual eye exams? Not established  If pt is not established with a provider, would they like to be referred to a provider to establish care? Yes .   Dental Screening: Recommended annual dental exams for proper oral hygiene  Community Resource Referral / Chronic Care Management: CRR required this  visit?  No   CCM required this visit?  No      Plan:     I have personally reviewed and noted the following  in the patient's chart:   Medical and social history Use of alcohol, tobacco or illicit drugs  Current medications and supplements including opioid prescriptions. Patient is currently taking opioid prescriptions. Information provided to patient regarding non-opioid alternatives. Patient advised to discuss non-opioid treatment plan with their provider. Functional ability and status Nutritional status Physical activity Advanced directives List of other physicians Hospitalizations, surgeries, and ER visits in previous 12 months Vitals Screenings to include cognitive, depression, and falls Referrals and appointments  In addition, I have reviewed and discussed with patient certain preventive protocols, quality metrics, and best practice recommendations. A written personalized care plan for preventive services as well as general preventive health recommendations were provided to patient.     Shelda Altes, CMA   11/09/2020   Nurse Notes: Pt was at home. Provider was in the office it was Jeremy Bouche MD. This was a telehealth visit.

## 2020-11-09 NOTE — Patient Instructions (Signed)
Jeremy Rodriguez , Thank you for taking time to come for your Medicare Wellness Visit. I appreciate your ongoing commitment to your health goals. Please review the following plan we discussed and let me know if I can assist you in the future.   Screening recommendations/referrals: Colonoscopy: Patient would like to wait  Recommended yearly ophthalmology/optometry visit for glaucoma screening and checkup Recommended yearly dental visit for hygiene and checkup  Vaccinations: Influenza vaccine: Due now  Pneumococcal vaccine: Due now Tdap vaccine: Due now  Shingles vaccine: Due now     Advanced directives: Information provided  Conditions/risks identified: Hypertension  Next appointment: 1 year   Preventive Care 70 Years and Older, Male Preventive care refers to lifestyle choices and visits with your health care provider that can promote health and wellness. What does preventive care include? A yearly physical exam. This is also called an annual well check. Dental exams once or twice a year. Routine eye exams. Ask your health care provider how often you should have your eyes checked. Personal lifestyle choices, including: Daily care of your teeth and gums. Regular physical activity. Eating a healthy diet. Avoiding tobacco and drug use. Limiting alcohol use. Practicing safe sex. Taking low doses of aspirin every day. Taking vitamin and mineral supplements as recommended by your health care provider. What happens during an annual well check? The services and screenings done by your health care provider during your annual well check will depend on your age, overall health, lifestyle risk factors, and family history of disease. Counseling  Your health care provider may ask you questions about your: Alcohol use. Tobacco use. Drug use. Emotional well-being. Home and relationship well-being. Sexual activity. Eating habits. History of falls. Memory and ability to understand  (cognition). Work and work Statistician. Screening  You may have the following tests or measurements: Height, weight, and BMI. Blood pressure. Lipid and cholesterol levels. These may be checked every 5 years, or more frequently if you are over 16 years old. Skin check. Lung cancer screening. You may have this screening every year starting at age 73 if you have a 30-pack-year history of smoking and currently smoke or have quit within the past 15 years. Fecal occult blood test (FOBT) of the stool. You may have this test every year starting at age 42. Flexible sigmoidoscopy or colonoscopy. You may have a sigmoidoscopy every 5 years or a colonoscopy every 10 years starting at age 46. Prostate cancer screening. Recommendations will vary depending on your family history and other risks. Hepatitis C blood test. Hepatitis B blood test. Sexually transmitted disease (STD) testing. Diabetes screening. This is done by checking your blood sugar (glucose) after you have not eaten for a while (fasting). You may have this done every 1-3 years. Abdominal aortic aneurysm (AAA) screening. You may need this if you are a current or former smoker. Osteoporosis. You may be screened starting at age 40 if you are at high risk. Talk with your health care provider about your test results, treatment options, and if necessary, the need for more tests. Vaccines  Your health care provider may recommend certain vaccines, such as: Influenza vaccine. This is recommended every year. Tetanus, diphtheria, and acellular pertussis (Tdap, Td) vaccine. You may need a Td booster every 10 years. Zoster vaccine. You may need this after age 42. Pneumococcal 13-valent conjugate (PCV13) vaccine. One dose is recommended after age 18. Pneumococcal polysaccharide (PPSV23) vaccine. One dose is recommended after age 34. Talk to your health care provider about which screenings  and vaccines you need and how often you need them. This  information is not intended to replace advice given to you by your health care provider. Make sure you discuss any questions you have with your health care provider. Document Released: 04/17/2015 Document Revised: 12/09/2015 Document Reviewed: 01/20/2015 Elsevier Interactive Patient Education  2017 [place] Prevention in the Home Falls can cause injuries. They can happen to people of all ages. There are many things you can do to make your home safe and to help prevent falls. What can I do on the outside of my home? Regularly fix the edges of walkways and driveways and fix any cracks. Remove anything that might make you trip as you walk through a door, such as a raised step or threshold. Trim any bushes or trees on the path to your home. Use bright outdoor lighting. Clear any walking paths of anything that might make someone trip, such as rocks or tools. Regularly check to see if handrails are loose or broken. Make sure that both sides of any steps have handrails. Any raised decks and porches should have guardrails on the edges. Have any leaves, snow, or ice cleared regularly. Use sand or salt on walking paths during winter. Clean up any spills in your garage right away. This includes oil or grease spills. What can I do in the bathroom? Use night lights. Install grab bars by the toilet and in the tub and shower. Do not use towel bars as grab bars. Use non-skid mats or decals in the tub or shower. If you need to sit down in the shower, use a plastic, non-slip stool. Keep the floor dry. Clean up any water that spills on the floor as soon as it happens. Remove soap buildup in the tub or shower regularly. Attach bath mats securely with double-sided non-slip rug tape. Do not have throw rugs and other things on the floor that can make you trip. What can I do in the bedroom? Use night lights. Make sure that you have a light by your bed that is easy to reach. Do not use any sheets or  blankets that are too big for your bed. They should not hang down onto the floor. Have a firm chair that has side arms. You can use this for support while you get dressed. Do not have throw rugs and other things on the floor that can make you trip. What can I do in the kitchen? Clean up any spills right away. Avoid walking on wet floors. Keep items that you use a lot in easy-to-reach places. If you need to reach something above you, use a strong step stool that has a grab bar. Keep electrical cords out of the way. Do not use floor polish or wax that makes floors slippery. If you must use wax, use non-skid floor wax. Do not have throw rugs and other things on the floor that can make you trip. What can I do with my stairs? Do not leave any items on the stairs. Make sure that there are handrails on both sides of the stairs and use them. Fix handrails that are broken or loose. Make sure that handrails are as long as the stairways. Check any carpeting to make sure that it is firmly attached to the stairs. Fix any carpet that is loose or worn. Avoid having throw rugs at the top or bottom of the stairs. If you do have throw rugs, attach them to the floor with carpet tape.  Make sure that you have a light switch at the top of the stairs and the bottom of the stairs. If you do not have them, ask someone to add them for you. What else can I do to help prevent falls? Wear shoes that: Do not have high heels. Have rubber bottoms. Are comfortable and fit you well. Are closed at the toe. Do not wear sandals. If you use a stepladder: Make sure that it is fully opened. Do not climb a closed stepladder. Make sure that both sides of the stepladder are locked into place. Ask someone to hold it for you, if possible. Clearly mark and make sure that you can see: Any grab bars or handrails. First and last steps. Where the edge of each step is. Use tools that help you move around (mobility aids) if they are  needed. These include: Canes. Walkers. Scooters. Crutches. Turn on the lights when you go into a dark area. Replace any light bulbs as soon as they burn out. Set up your furniture so you have a clear path. Avoid moving your furniture around. If any of your floors are uneven, fix them. If there are any pets around you, be aware of where they are. Review your medicines with your doctor. Some medicines can make you feel dizzy. This can increase your chance of falling. Ask your doctor what other things that you can do to help prevent falls. This information is not intended to replace advice given to you by your health care provider. Make sure you discuss any questions you have with your health care provider. Document Released: 01/15/2009 Document Revised: 08/27/2015 Document Reviewed: 04/25/2014 Elsevier Interactive Patient Education  2017 [employer].

## 2020-11-11 ENCOUNTER — Other Ambulatory Visit: Payer: Self-pay

## 2020-11-11 ENCOUNTER — Ambulatory Visit (HOSPITAL_COMMUNITY)
Admission: RE | Admit: 2020-11-11 | Discharge: 2020-11-11 | Disposition: A | Discharge status: Home / Self Care | Payer: Medicare Other | Source: Ambulatory Visit | Attending: Hematology | Admitting: Hematology

## 2020-11-11 DIAGNOSIS — D492 Neoplasm of unspecified behavior of bone, soft tissue, and skin: Secondary | ICD-10-CM | POA: Diagnosis not present

## 2020-11-11 DIAGNOSIS — C652 Malignant neoplasm of left renal pelvis: Secondary | ICD-10-CM | POA: Diagnosis not present

## 2020-11-11 DIAGNOSIS — C7910 Secondary malignant neoplasm of unspecified urinary organs: Secondary | ICD-10-CM | POA: Diagnosis not present

## 2020-11-11 DIAGNOSIS — C787 Secondary malignant neoplasm of liver and intrahepatic bile duct: Secondary | ICD-10-CM | POA: Diagnosis not present

## 2020-11-11 DIAGNOSIS — K7689 Other specified diseases of liver: Secondary | ICD-10-CM | POA: Diagnosis not present

## 2020-11-11 DIAGNOSIS — I700 Atherosclerosis of aorta: Secondary | ICD-10-CM | POA: Diagnosis not present

## 2020-11-11 DIAGNOSIS — C679 Malignant neoplasm of bladder, unspecified: Secondary | ICD-10-CM | POA: Diagnosis not present

## 2020-11-11 MED ORDER — IOHEXOL 350 MG/ML SOLN
80.0000 mL | Freq: Once | INTRAVENOUS | Status: AC | PRN
  Administered 2020-11-11: 80 mL via INTRAVENOUS

## 2020-11-11 NOTE — Progress Notes (Signed)
Jeremy Rodriguez, Jeremy Rodriguez [id number]   CLINIC:  Medical Oncology/Hematology  PCP:  Jeremy Spar, MD [address] / [place] [address] [phone]   REASON FOR VISIT:  Follow-up for  metastatic urothelial carcinoma  PRIOR THERAPY: none  NGS Results: not done  CURRENT THERAPY: Pembrolizumab q21d  BRIEF ONCOLOGIC HISTORY:  Oncology History  Metastatic urothelial carcinoma ([place])  08/13/2020 Initial Diagnosis   Metastatic urothelial carcinoma ([place])    08/13/2020 Cancer Staging   Staging form: Urinary Bladder, AJCC 8th Edition - Clinical stage from 08/13/2020: Stage IVB (cTX, cN0, pM1b) - Signed by Derek Jack, MD on 08/13/2020  Stage prefix: Initial diagnosis  WHO/ISUP grade (low/high): High Grade  Histologic grading system: 2 grade system    08/20/2020 -  Chemotherapy    Patient is on Treatment Plan: BLADDER PEMBROLIZUMAB Q21D         CANCER STAGING: Cancer Staging Metastatic urothelial carcinoma ([place]) Staging form: Urinary Bladder, AJCC 8th Edition - Clinical stage from 08/13/2020: Stage IVB (cTX, cN0, pM1b) - Signed by Derek Jack, MD on 08/13/2020   INTERVAL HISTORY:  [name], a 70 y.o. male, returns for routine follow-up and consideration for next cycle of chemotherapy. Jeremy Rodriguez was last seen on 10/22/20.  Due for cycle #5 of Keytruda today.   Overall, he tells me he has been feeling pretty well. He denies constipation. He takes ibuprofen prn for his left side pain which is unchanged since starting treatment.   Overall, he feels ready for next cycle of chemo today.   REVIEW OF SYSTEMS:  Review of Systems  Constitutional:  Positive for appetite change (75%) and fatigue (75%).  Cardiovascular:  Positive for chest pain (L side 5/10).  Gastrointestinal:  Positive for abdominal pain (L side 5/10). Negative for constipation.  All other systems reviewed and are negative.  PAST  MEDICAL/SURGICAL HISTORY:  Past Medical History:  Diagnosis Date   Chronic kidney disease    GERD (gastroesophageal reflux disease)    Hypertension    Medical history non-contributory    Past Surgical History:  Procedure Laterality Date   appendectomy      SOCIAL HISTORY:  Social History   Socioeconomic History   Marital status: Single    Spouse name: Not on file   Number of children: Not on file   Years of education: Not on file   Highest education Rodriguez: Not on file  Occupational History   Not on file  Tobacco Use   Smoking status: Some Days   Smokeless tobacco: Never   Tobacco comments:    a few cigarettes a day  Substance and Sexual Activity   Alcohol use: Yes    Comment: occ   Drug use: Never   Sexual activity: Not on file  Other Topics Concern   Not on file  Social History Narrative   Not on file   Social Determinants of Health   Financial Resource Strain: Low Risk    Difficulty of Paying Living Expenses: Not hard at all  Food Insecurity: No Food Insecurity   Worried About Charity fundraiser in the Last Year: Never true   [place] in the Last Year: Never true  Transportation Needs: No Transportation Needs   Lack of Transportation (Medical): No   Lack of Transportation (Non-Medical): No  Physical Activity: Inactive   Days of Exercise per Week: 5 days   Minutes of Exercise per Session: 0 min  Stress:  No Stress Concern Present   Feeling of Stress : Not at all  Social Connections: Moderately Isolated   Frequency of Communication with Friends and Family: More than three times a week   Frequency of Social Gatherings with Friends and Family: More than three times a week   Attends Religious Services: More than 4 times per year   Active Member of [employer] or Organizations: No   Attends Music therapist: Never   Marital Status: Divorced  Human resources officer Violence: Not At Risk   Fear of Current or Ex-Partner: No   Emotionally Abused: No    Physically Abused: No   Sexually Abused: No    FAMILY HISTORY:  Family History  Problem Relation Age of Onset   Colon cancer Neg Hx    Colon polyps Neg Hx    Liver cancer Neg Hx    Pancreatic cancer Neg Hx     CURRENT MEDICATIONS:  Current Outpatient Medications  Medication Sig Dispense Refill   amLODipine (NORVASC) 5 MG tablet Take 1 tablet (5 mg total) by mouth daily. 90 tablet 1   dicyclomine (BENTYL) 10 MG capsule Take 1 capsule (10 mg total) by mouth 3 (three) times daily as needed for spasms. 30 capsule 0   docusate sodium (COLACE) 100 MG capsule Take 1 capsule (100 mg total) by mouth daily as needed for mild constipation. 30 capsule 0   HYDROcodone-acetaminophen (NORCO) 10-325 MG tablet Take 1 tablet by mouth every 6 (six) hours as needed. 120 tablet 0   Lactulose 20 GM/30ML SOLN Take 30 mLs (20 g total) by mouth daily. 473 mL 6   megestrol (MEGACE) 400 MG/10ML suspension Take 10 mLs (400 mg total) by mouth daily. 240 mL 0   naloxegol oxalate (MOVANTIK) 25 MG TABS tablet Take 1 tablet (25 mg total) by mouth daily. 30 tablet 3   oxyCODONE (ROXICODONE) 15 MG immediate release tablet Take 1 tablet (15 mg total) by mouth every 6 (six) hours as needed for pain. 30 tablet 0   pantoprazole (PROTONIX) 40 MG tablet Take 1 tablet (40 mg total) by mouth daily. 30 tablet 3   Pembrolizumab (KEYTRUDA IV) Inject 200 mg into the vein every 21 ( twenty-one) days.     telmisartan (MICARDIS) 20 MG tablet Take 1 tablet (20 mg total) by mouth daily. 90 tablet 1   No current facility-administered medications for this visit.    ALLERGIES:  No Known Allergies  PHYSICAL EXAM:  Performance status (ECOG): 1 - Symptomatic but completely ambulatory  There were no vitals filed for this visit. Wt Readings from Last 3 Encounters:  10/22/20 146 lb (66.2 kg)  10/01/20 150 lb 6.4 oz (68.2 kg)  09/10/20 150 lb 12.8 oz (68.4 kg)   Physical Exam Vitals reviewed.  Constitutional:      Appearance:  Normal appearance.  Cardiovascular:     Rate and Rhythm: Normal rate and regular rhythm.     Pulses: Normal pulses.     Heart sounds: Normal heart sounds.  Pulmonary:     Effort: Pulmonary effort is normal.     Breath sounds: Normal breath sounds.  Neurological:     General: No focal deficit present.     Mental Status: He is alert and oriented to person, place, and time.  Psychiatric:        Mood and Affect: Mood normal.        Behavior: Behavior normal.    LABORATORY DATA:  I have reviewed the labs as listed.  CBC Latest Ref Rng & Units 10/22/2020 10/01/2020 09/10/2020  WBC 4.0 - 10.5 K/uL 6.6 6.9 6.0  Hemoglobin 13.0 - 17.0 g/dL 9.8(L) 9.7(L) 10.3(L)  Hematocrit 39.0 - 52.0 % 32.4(L) 32.0(L) 33.1(L)  Platelets 150 - 400 K/uL 382 357 357   CMP Latest Ref Rng & Units 10/22/2020 10/01/2020 09/10/2020  Glucose 70 - 99 mg/dL 125(H) 145(H) 134(H)  BUN 8 - 23 mg/dL _0 Creatinine 0.61 - 1.24 mg/dL 1.02 1.03 0.99  Sodium 135 - 145 mmol/L 134(L) 136 136  Potassium 3.5 - 5.1 mmol/L 4.2 3.9 4.7  Chloride 98 - 111 mmol/L 101 104 105  CO2 22 - 32 mmol/L _1 Calcium 8.9 - 10.3 mg/dL 8.9 8.9 8.8(L)  Total Protein 6.5 - 8.1 g/dL 7.2 6.6 7.1  Total Bilirubin 0.3 - 1.2 mg/dL 0.5 0.5 0.5  Alkaline Phos 38 - 126 U/L 63 66 63  AST 15 - 41 U/L _2 ALT 0 - 44 U/L _3 DIAGNOSTIC IMAGING:  I have independently reviewed the scans and discussed with the patient. No results found.   ASSESSMENT:  1. 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 - 20 pound weight loss in the last 4 months due to decreased appetite.  Left upper quadrant pain for the past 2 to 3 months. - CTAP with contrast on 07/10/2020 showed ill-defined low-density mass in the right hepatic lobe measuring 2 x 2 cm.  Left upper pole kidney mass measuring 7.6 x 5.9 cm with encasement and narrowing of the  left renal artery and vein.  No intravascular thrombus noted.  Mass encases the left adrenal gland.  Small right adrenal nodule measuring 11 mm, likely benign.  No other evidence of metastatic disease. - MRI of the liver with and without contrast on 07/20/2020 shows 2.2 x 1.7 cm enhancing lesion in the medial aspect of the segment 7, suspicious for metastasis.  Left renal mass, 6 x 7.6 x 6.9 cm infiltrating enhancing left upper pole renal mass, extending into the left renal sinus.  Possible TCC or lymphoma rather than renal cell carcinoma. - CT chest on 07/20/2020 with no evidence of metastatic disease. - PET scan on 08/04/2020 showed large hypermetabolic left upper pole mass and 2 hypermetabolic lesions in the right hepatic lobe favoring metastatic disease.  No hypermetabolic adenopathy.  - Liver biopsy showed poorly differentiated metastatic urothelial carcinoma.  IHC positive for CK5/6, CK7, GATA3, p40 and patchy positivity with p63 and PAX8. - He refused any chemotherapy. - 4 cycles of pembrolizumab from 08/20/2020 through 10/22/2020 with progression. - NGS testing showed benefit from pembrolizumab.  No mutations involving FGFR, NTRK was found.  Test was limited due to small sample. - Enfortumab vedotin started on   2.  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 - Reports exposure to carbon black dust.  Smoked half pack per day for 30 years, currently smoking 2 to 3 cigarettes/day. - No family history of malignancy.   PLAN:  1. Metastatic poorly differentiated urothelial carcinoma arising in the renal pelvis: -He has completed 4 cycles of pembrolizumab. - He did not experience any improvement in the left upper quadrant pain.  He does not have any immunotherapy related side effects. - We reviewed results of CT CAP from 11/11/2020 which showed interval progression of liver  metastasis.  No significant change in the infiltrative mass arising in the  upper pole of the left kidney invading the medial aspect of the upper spleen, left renal vein and left adrenal gland with soft tissue infiltration into the left retroperitoneal fat.  No evidence of metastatic disease to the chest. - We will discontinue pembrolizumab due to progression.  We discussed further options including enfortumab vedotin.  The patient prefers not chemo options. - We discussed enfortumab vedotin given on days 1, 8, 15 every 28 days.  We discussed side effects including but not limited to skin rashes, ocular toxicity, peripheral neuropathy, interstitial lung disease among others. - We will likely start his first treatment next week after insurance preauthorization.   2.  Left upper quadrant pain: - Continue hydrocodone 3 to 4 tablets daily. - Continue ibuprofen as needed.  No improvement since start of Keytruda.  3.  Constipation: - Continue Colace 2 tablets daily. - Continue lactulose 30 mL twice daily as needed. - She started taking Linzess which is helping with constipation. - He did not fill Movantik because of high co-pay.  4.  Normocytic to microcytic anemia: - Hemoglobin is 10.6 with MCV 74.  Ferritin is down to 10. - Because of his constipation, I am not recommending oral iron therapy. - Recommend Feraheme weekly x2.  We discussed side effects including rare chance of allergic reactions.   Orders placed this encounter:  No orders of the defined types were placed in this encounter.    Derek Jack, MD [place] [phone]   I, Thana Ates, am acting as a scribe for Dr. Derek Jack.  I, Derek Jack MD, have reviewed the above documentation for accuracy and completeness, and I agree with the above.

## 2020-11-12 ENCOUNTER — Other Ambulatory Visit (HOSPITAL_COMMUNITY): Payer: Self-pay | Admitting: *Deleted

## 2020-11-12 ENCOUNTER — Inpatient Hospital Stay (HOSPITAL_COMMUNITY): Payer: Medicare Other

## 2020-11-12 ENCOUNTER — Encounter (HOSPITAL_COMMUNITY): Payer: Self-pay

## 2020-11-12 ENCOUNTER — Inpatient Hospital Stay (HOSPITAL_COMMUNITY): Payer: Medicare Other | Attending: Hematology

## 2020-11-12 ENCOUNTER — Inpatient Hospital Stay (HOSPITAL_BASED_OUTPATIENT_CLINIC_OR_DEPARTMENT_OTHER): Payer: Medicare Other | Admitting: Hematology

## 2020-11-12 VITALS — BP 110/60 | HR 73 | Temp 96.8°F | Resp 18

## 2020-11-12 VITALS — BP 124/67 | HR 82 | Temp 96.9°F | Resp 18 | Wt 142.9 lb

## 2020-11-12 DIAGNOSIS — C7910 Secondary malignant neoplasm of unspecified urinary organs: Secondary | ICD-10-CM | POA: Diagnosis not present

## 2020-11-12 DIAGNOSIS — Z9049 Acquired absence of other specified parts of digestive tract: Secondary | ICD-10-CM | POA: Diagnosis not present

## 2020-11-12 DIAGNOSIS — R140 Abdominal distension (gaseous): Secondary | ICD-10-CM

## 2020-11-12 DIAGNOSIS — Z79899 Other long term (current) drug therapy: Secondary | ICD-10-CM | POA: Diagnosis not present

## 2020-11-12 DIAGNOSIS — C787 Secondary malignant neoplasm of liver and intrahepatic bile duct: Secondary | ICD-10-CM | POA: Diagnosis not present

## 2020-11-12 DIAGNOSIS — C679 Malignant neoplasm of bladder, unspecified: Secondary | ICD-10-CM | POA: Diagnosis not present

## 2020-11-12 DIAGNOSIS — C652 Malignant neoplasm of left renal pelvis: Secondary | ICD-10-CM | POA: Diagnosis not present

## 2020-11-12 DIAGNOSIS — N189 Chronic kidney disease, unspecified: Secondary | ICD-10-CM | POA: Insufficient documentation

## 2020-11-12 DIAGNOSIS — Z5112 Encounter for antineoplastic immunotherapy: Secondary | ICD-10-CM | POA: Insufficient documentation

## 2020-11-12 DIAGNOSIS — I129 Hypertensive chronic kidney disease with stage 1 through stage 4 chronic kidney disease, or unspecified chronic kidney disease: Secondary | ICD-10-CM | POA: Insufficient documentation

## 2020-11-12 DIAGNOSIS — D509 Iron deficiency anemia, unspecified: Secondary | ICD-10-CM | POA: Insufficient documentation

## 2020-11-12 DIAGNOSIS — R109 Unspecified abdominal pain: Secondary | ICD-10-CM

## 2020-11-12 LAB — CBC WITH DIFFERENTIAL/PLATELET
Abs Immature Granulocytes: 0.03 10*3/uL (ref 0.00–0.07)
Basophils Absolute: 0 10*3/uL (ref 0.0–0.1)
Basophils Relative: 0 %
Eosinophils Absolute: 0.1 10*3/uL (ref 0.0–0.5)
Eosinophils Relative: 1 %
HCT: 34.8 % — ABNORMAL LOW (ref 39.0–52.0)
Hemoglobin: 10.6 g/dL — ABNORMAL LOW (ref 13.0–17.0)
Immature Granulocytes: 0 %
Lymphocytes Relative: 17 %
Lymphs Abs: 1.5 10*3/uL (ref 0.7–4.0)
MCH: 22.7 pg — ABNORMAL LOW (ref 26.0–34.0)
MCHC: 30.5 g/dL (ref 30.0–36.0)
MCV: 74.7 fL — ABNORMAL LOW (ref 80.0–100.0)
Monocytes Absolute: 0.8 10*3/uL (ref 0.1–1.0)
Monocytes Relative: 8 %
Neutro Abs: 6.8 10*3/uL (ref 1.7–7.7)
Neutrophils Relative %: 74 %
Platelets: 429 10*3/uL — ABNORMAL HIGH (ref 150–400)
RBC: 4.66 MIL/uL (ref 4.22–5.81)
RDW: 17.6 % — ABNORMAL HIGH (ref 11.5–15.5)
WBC: 9.2 10*3/uL (ref 4.0–10.5)
nRBC: 0 % (ref 0.0–0.2)

## 2020-11-12 LAB — COMPREHENSIVE METABOLIC PANEL
ALT: 15 U/L (ref 0–44)
AST: 17 U/L (ref 15–41)
Albumin: 3.7 g/dL (ref 3.5–5.0)
Alkaline Phosphatase: 68 U/L (ref 38–126)
Anion gap: 7 (ref 5–15)
BUN: 18 mg/dL (ref 8–23)
CO2: 22 mmol/L (ref 22–32)
Calcium: 9.2 mg/dL (ref 8.9–10.3)
Chloride: 104 mmol/L (ref 98–111)
Creatinine, Ser: 1.02 mg/dL (ref 0.61–1.24)
GFR, Estimated: >60 mL/min (ref >60)
Glucose, Bld: 174 mg/dL — ABNORMAL HIGH (ref 70–99)
Potassium: 4.7 mmol/L (ref 3.5–5.1)
Sodium: 133 mmol/L — ABNORMAL LOW (ref 135–145)
Total Bilirubin: 0.4 mg/dL (ref 0.3–1.2)
Total Protein: 7.4 g/dL (ref 6.5–8.1)

## 2020-11-12 LAB — IRON AND TIBC
Iron: 16 ug/dL — ABNORMAL LOW (ref 45–182)
Saturation Ratios: 4 % — ABNORMAL LOW (ref 17.9–39.5)
TIBC: 416 ug/dL (ref 250–450)
UIBC: 400 ug/dL

## 2020-11-12 LAB — FERRITIN: Ferritin: 10 ng/mL — ABNORMAL LOW (ref 24–336)

## 2020-11-12 LAB — TSH: TSH: 1.703 u[IU]/mL (ref 0.350–4.500)

## 2020-11-12 MED ORDER — SODIUM CHLORIDE 0.9 % IV SOLN: Freq: Once | INTRAVENOUS | Status: AC

## 2020-11-12 MED ORDER — SODIUM CHLORIDE 0.9 % IV SOLN
510.0000 mg | Freq: Once | INTRAVENOUS | Status: AC
  Administered 2020-11-12: 510 mg via INTRAVENOUS
  Filled 2020-11-12: qty 510

## 2020-11-12 MED ORDER — DICYCLOMINE HCL 10 MG PO CAPS: 10.0000 mg | ORAL_CAPSULE | Freq: Three times a day (TID) | ORAL | 0 refills | Status: DC | PRN

## 2020-11-12 NOTE — Progress Notes (Signed)
DISCONTINUE OFF PATHWAY REGIMEN - Bladder   [id number]:Pembrolizumab 200 mg IV D1 q21 Days:   A cycle is every 21 days:     Pembrolizumab   **Always confirm dose/schedule in your pharmacy ordering system**  REASON: Disease Progression PRIOR TREATMENT: Off Pathway: Pembrolizumab 200 mg IV D1 q21 Days TREATMENT RESPONSE: Progressive Disease (PD)  START ON PATHWAY REGIMEN - Bladder     A cycle is every 28 days:     Enfortumab vedotin-ejfv   **Always confirm dose/schedule in your pharmacy ordering system**  Patient Characteristics: Advanced/Metastatic Disease, Second Line, FGFR2/FGFR3 Mutation Negative or Unknown, Prior/Ineligible for Platinum-Based Therapy and Prior/Ineligible for PD-1/PD-L1 Inhibitor Therapeutic Status: Advanced/Metastatic Disease Line of Therapy: Second Line FGFR2/FGFR3 Mutation Status: Negative Intent of Therapy: Non-Curative / Palliative Intent, Discussed with Patient

## 2020-11-12 NOTE — Progress Notes (Signed)
Patient presents today for [facility] per providers order.  Vital signs and labs within parameters for treatment.    Per Dr. Delton Coombes, patient will not receive Keytruda today but is placing an order for [facility].  Peripheral IV started and blood return noted pre and post infusion.  Feraheme infusion given today per MD orders.  Stable during infusion without adverse affects.  Vital signs stable.  No complaints at this time.  Discharge from clinic ambulatory in stable condition.  Alert and oriented X 3.  Follow up with [facility] as scheduled.

## 2020-11-12 NOTE — Progress Notes (Signed)
Patient has been assessed, vital signs and labs have been reviewed by Dr. Delton Coombes. ANC, Creatinine, LFTs, and Platelets are within treatment parameters per Dr. Delton Coombes. The patient is good to proceed with treatment at this time.  Iron studies are very low today and will receive Feraheme today and Hold Keytruda until CT finalized. Primary RN and pharmacy aware.

## 2020-11-12 NOTE — Patient Instructions (Signed)
[facility]  Discharge Instructions: °Thank you for choosing [facility] to provide your oncology and hematology care.  °If you have a lab appointment with the Cancer Center, please come in thru the Main Entrance and check in at the main information desk. ° °Wear comfortable clothing and clothing appropriate for easy access to any Portacath or PICC line.  ° °We strive to give you quality time with your provider. You may need to reschedule your appointment if you arrive late (15 or more minutes).  Arriving late affects you and other patients whose appointments are after yours.  Also, if you miss three or more appointments without notifying the office, you may be dismissed from the clinic at the provider’s discretion.    °  °For prescription refill requests, have your pharmacy contact our office and allow 72 hours for refills to be completed.   ° °Today you received the following chemotherapy and/or immunotherapy agents Feraheme    °  °To help prevent nausea and vomiting after your treatment, we encourage you to take your nausea medication as directed. ° °BELOW ARE SYMPTOMS THAT SHOULD BE REPORTED IMMEDIATELY: °*FEVER GREATER THAN 100.4 F (38 °C) OR HIGHER °*CHILLS OR SWEATING °*NAUSEA AND VOMITING THAT IS NOT CONTROLLED WITH YOUR NAUSEA MEDICATION °*UNUSUAL SHORTNESS OF BREATH °*UNUSUAL BRUISING OR BLEEDING °*URINARY PROBLEMS (pain or burning when urinating, or frequent urination) °*BOWEL PROBLEMS (unusual diarrhea, constipation, pain near the anus) °TENDERNESS IN MOUTH AND THROAT WITH OR WITHOUT PRESENCE OF ULCERS (sore throat, sores in mouth, or a toothache) °UNUSUAL RASH, SWELLING OR PAIN  °UNUSUAL VAGINAL DISCHARGE OR ITCHING  ° °Items with * indicate a potential emergency and should be followed up as soon as possible or go to the Emergency Department if any problems should occur. ° °Please show the CHEMOTHERAPY ALERT CARD or IMMUNOTHERAPY ALERT CARD at check-in to the Emergency  Department and triage nurse. ° °Should you have questions after your visit or need to cancel or reschedule your appointment, please contact [facility] [phone]  and follow the prompts.  Office hours are 8:00 a.m. to 4:30 p.m. Monday - Friday. Please note that voicemails left after 4:00 p.m. may not be returned until the following business day.  We are closed weekends and major holidays. You have access to a nurse at all times for urgent questions. Please call the main number to the clinic [phone] and follow the prompts. ° °For any non-urgent questions, you may also contact your provider using MyChart. We now offer e-Visits for anyone 18 and older to request care online for non-urgent symptoms. For details visit [email or web address]. °  °Also download the MyChart app! Go to the app store, search "MyChart", open the app, select [facility], and log in with your MyChart username and password. ° °Due to Covid, a mask is required upon entering the hospital/clinic. If you do not have a mask, one will be given to you upon arrival. For doctor visits, patients may have 1 support person aged 18 or older with them. For treatment visits, patients cannot have anyone with them due to current Covid guidelines and our immunocompromised population.  °

## 2020-11-12 NOTE — Progress Notes (Signed)
Pharmacist Chemotherapy Monitoring - Initial Assessment    Anticipated start date: 11/12/20   The following has been reviewed per standard work regarding the patient's treatment regimen: The patient's diagnosis, treatment plan and drug doses, and organ/hematologic function Lab orders and baseline tests specific to treatment regimen  The treatment plan start date, drug sequencing, and pre-medications Prior authorization status  Patient's documented medication list, including drug-drug interaction screen and prescriptions for anti-emetics and supportive care specific to the treatment regimen The drug concentrations, fluid compatibility, administration routes, and timing of the medications to be used The patient's access for treatment and lifetime cumulative dose history, if applicable  The patient's medication allergies and previous infusion related reactions, if applicable   Changes made to treatment plan:  treatment plan date  Follow up needed:  N/A   Wynona Neat, [facility], 11/12/2020  4:26 PM

## 2020-11-12 NOTE — Patient Instructions (Addendum)
[place] [facility] Discharge Instructions  You were seen today by Dr. Delton Coombes. He went over your recent results. You will begin your new treatment (Enfortumab) next week. Dr. Delton Coombes will see you back in 2 weeks for labs and follow up.   Thank you for choosing [place] [facility] to provide your oncology and hematology care.  To afford each patient quality time with our provider, please arrive at least 15 minutes before your scheduled appointment time.   If you have a lab appointment with the [facility] please come in thru the Main Entrance and check in at the main information desk  You need to re-schedule your appointment should you arrive 10 or more minutes late.  We strive to give you quality time with our providers, and arriving late affects you and other patients whose appointments are after yours.  Also, if you no show three or more times for appointments you may be dismissed from the clinic at the providers discretion.     Again, thank you for choosing [facility].  Our hope is that these requests will decrease the amount of time that you wait before being seen by our physicians.       _____________________________________________________________  Should you have questions after your visit to [facility], please contact our office at [phone] between the hours of 8:00 a.m. and 4:30 p.m.  Voicemails left after 4:00 p.m. will not be returned until the following business day.  For prescription refill requests, have your pharmacy contact our office and allow 72 hours.    Cancer Center Support Programs:   > Cancer Support Group  2nd Tuesday of the month 1pm-2pm, Journey Room

## 2020-11-13 ENCOUNTER — Other Ambulatory Visit (HOSPITAL_COMMUNITY): Payer: Self-pay

## 2020-11-15 ENCOUNTER — Encounter (HOSPITAL_COMMUNITY): Payer: Self-pay | Admitting: Hematology

## 2020-11-16 ENCOUNTER — Other Ambulatory Visit (HOSPITAL_COMMUNITY): Payer: Self-pay | Admitting: *Deleted

## 2020-11-16 MED ORDER — OXYCODONE HCL 15 MG PO TABS: 15.0000 mg | ORAL_TABLET | Freq: Four times a day (QID) | ORAL | 0 refills | Status: DC | PRN

## 2020-11-18 ENCOUNTER — Ambulatory Visit: Payer: Medicare Other | Admitting: Internal Medicine

## 2020-11-18 ENCOUNTER — Other Ambulatory Visit (HOSPITAL_COMMUNITY): Payer: Self-pay

## 2020-11-18 DIAGNOSIS — C652 Malignant neoplasm of left renal pelvis: Secondary | ICD-10-CM

## 2020-11-18 DIAGNOSIS — C7910 Secondary malignant neoplasm of unspecified urinary organs: Secondary | ICD-10-CM

## 2020-11-19 ENCOUNTER — Inpatient Hospital Stay (HOSPITAL_COMMUNITY): Payer: Medicare Other

## 2020-11-19 ENCOUNTER — Encounter (HOSPITAL_COMMUNITY): Payer: Self-pay

## 2020-11-19 ENCOUNTER — Other Ambulatory Visit: Payer: Self-pay

## 2020-11-19 ENCOUNTER — Telehealth: Payer: Self-pay

## 2020-11-19 VITALS — BP 135/64 | HR 62 | Temp 98.3°F | Resp 18 | Wt 144.0 lb

## 2020-11-19 DIAGNOSIS — C652 Malignant neoplasm of left renal pelvis: Secondary | ICD-10-CM

## 2020-11-19 DIAGNOSIS — D509 Iron deficiency anemia, unspecified: Secondary | ICD-10-CM | POA: Diagnosis not present

## 2020-11-19 DIAGNOSIS — C787 Secondary malignant neoplasm of liver and intrahepatic bile duct: Secondary | ICD-10-CM | POA: Diagnosis not present

## 2020-11-19 DIAGNOSIS — Z5112 Encounter for antineoplastic immunotherapy: Secondary | ICD-10-CM | POA: Diagnosis not present

## 2020-11-19 DIAGNOSIS — C7910 Secondary malignant neoplasm of unspecified urinary organs: Secondary | ICD-10-CM

## 2020-11-19 DIAGNOSIS — Z79899 Other long term (current) drug therapy: Secondary | ICD-10-CM | POA: Diagnosis not present

## 2020-11-19 DIAGNOSIS — I129 Hypertensive chronic kidney disease with stage 1 through stage 4 chronic kidney disease, or unspecified chronic kidney disease: Secondary | ICD-10-CM | POA: Diagnosis not present

## 2020-11-19 DIAGNOSIS — Z9049 Acquired absence of other specified parts of digestive tract: Secondary | ICD-10-CM | POA: Diagnosis not present

## 2020-11-19 DIAGNOSIS — N189 Chronic kidney disease, unspecified: Secondary | ICD-10-CM | POA: Diagnosis not present

## 2020-11-19 DIAGNOSIS — C679 Malignant neoplasm of bladder, unspecified: Secondary | ICD-10-CM | POA: Diagnosis not present

## 2020-11-19 DIAGNOSIS — K219 Gastro-esophageal reflux disease without esophagitis: Secondary | ICD-10-CM

## 2020-11-19 LAB — CBC WITH DIFFERENTIAL/PLATELET
Abs Immature Granulocytes: 0.07 10*3/uL (ref 0.00–0.07)
Basophils Absolute: 0 10*3/uL (ref 0.0–0.1)
Basophils Relative: 0 %
Eosinophils Absolute: 0.1 10*3/uL (ref 0.0–0.5)
Eosinophils Relative: 1 %
HCT: 34.1 % — ABNORMAL LOW (ref 39.0–52.0)
Hemoglobin: 10.7 g/dL — ABNORMAL LOW (ref 13.0–17.0)
Immature Granulocytes: 1 %
Lymphocytes Relative: 17 %
Lymphs Abs: 1.7 10*3/uL (ref 0.7–4.0)
MCH: 23.4 pg — ABNORMAL LOW (ref 26.0–34.0)
MCHC: 31.4 g/dL (ref 30.0–36.0)
MCV: 74.5 fL — ABNORMAL LOW (ref 80.0–100.0)
Monocytes Absolute: 1 10*3/uL (ref 0.1–1.0)
Monocytes Relative: 9 %
Neutro Abs: 7.4 10*3/uL (ref 1.7–7.7)
Neutrophils Relative %: 72 %
Platelets: 366 10*3/uL (ref 150–400)
RBC: 4.58 MIL/uL (ref 4.22–5.81)
RDW: 19.8 % — ABNORMAL HIGH (ref 11.5–15.5)
WBC: 10.3 10*3/uL (ref 4.0–10.5)
nRBC: 0 % (ref 0.0–0.2)

## 2020-11-19 LAB — COMPREHENSIVE METABOLIC PANEL
ALT: 25 U/L (ref 0–44)
AST: 17 U/L (ref 15–41)
Albumin: 3.5 g/dL (ref 3.5–5.0)
Alkaline Phosphatase: 56 U/L (ref 38–126)
Anion gap: 7 (ref 5–15)
BUN: 19 mg/dL (ref 8–23)
CO2: 22 mmol/L (ref 22–32)
Calcium: 9.1 mg/dL (ref 8.9–10.3)
Chloride: 104 mmol/L (ref 98–111)
Creatinine, Ser: 0.91 mg/dL (ref 0.61–1.24)
GFR, Estimated: >60 mL/min (ref >60)
Glucose, Bld: 107 mg/dL — ABNORMAL HIGH (ref 70–99)
Potassium: 4.3 mmol/L (ref 3.5–5.1)
Sodium: 133 mmol/L — ABNORMAL LOW (ref 135–145)
Total Bilirubin: 0.4 mg/dL (ref 0.3–1.2)
Total Protein: 7.1 g/dL (ref 6.5–8.1)

## 2020-11-19 LAB — TSH: TSH: 2.765 u[IU]/mL (ref 0.350–4.500)

## 2020-11-19 LAB — MAGNESIUM: Magnesium: 2 mg/dL (ref 1.7–2.4)

## 2020-11-19 MED ORDER — SODIUM CHLORIDE 0.9 % IV SOLN
10.0000 mg | Freq: Once | INTRAVENOUS | Status: AC
  Administered 2020-11-19: 10 mg via INTRAVENOUS
  Filled 2020-11-19: qty 10

## 2020-11-19 MED ORDER — SODIUM CHLORIDE 0.9 % IV SOLN
510.0000 mg | Freq: Once | INTRAVENOUS | Status: AC
  Administered 2020-11-19: 510 mg via INTRAVENOUS
  Filled 2020-11-19: qty 510

## 2020-11-19 MED ORDER — PALONOSETRON HCL INJECTION 0.25 MG/5ML
0.2500 mg | Freq: Once | INTRAVENOUS | Status: AC
  Administered 2020-11-19: 0.25 mg via INTRAVENOUS
  Filled 2020-11-19: qty 5

## 2020-11-19 MED ORDER — PANTOPRAZOLE SODIUM 40 MG PO TBEC: 40.0000 mg | DELAYED_RELEASE_TABLET | Freq: Every day | ORAL | 3 refills | Status: DC

## 2020-11-19 MED ORDER — SODIUM CHLORIDE 0.9 % IV SOLN: Freq: Once | INTRAVENOUS | Status: AC

## 2020-11-19 MED ORDER — SODIUM CHLORIDE 0.9 % IV SOLN
1.2400 mg/kg | Freq: Once | INTRAVENOUS | Status: AC
  Administered 2020-11-19: 80 mg via INTRAVENOUS
  Filled 2020-11-19: qty 8

## 2020-11-19 NOTE — Progress Notes (Signed)
Patient presents today for [facility] and Day 1 Cycle 1 of [place]. Vital signs and labs within treatment parameters, okay for treatment per Dr. Delton Coombes, plan signed. Consent signed by patient. Patient reports constipation, per Dr. Delton Coombes patient should increase Lactulose to 3 times per day if constipation occurs. Per Patient, last bowel movement was yesterday.   Patient tolerated iron infusion with no complaints voiced. Peripheral IV site clean and dry with good blood return noted before and after infusion.    Premedications administered before treatment.  Patient tolerated chemotherapy with no complaints voiced. Side effects with management reviewed understanding verbalized. IV site clean and dry with no bruising or swelling noted at site. Good blood return noted before and after administration of chemotherapy. Band aid applied. Patient left in satisfactory condition with VSS and no s/s of distress noted.

## 2020-11-19 NOTE — Telephone Encounter (Signed)
Patient daughter called needs a refill pantoprazole (PROTONIX) 40 MG tablet   [facility]

## 2020-11-19 NOTE — Progress Notes (Deleted)
Patient took his Claritin and pepcid this am , as part of his regular everyday meds. No need to administer extra doses today per pre-meds per MD.

## 2020-11-19 NOTE — Telephone Encounter (Signed)
Rx refilled.

## 2020-11-19 NOTE — Patient Instructions (Signed)
[facility]  Discharge Instructions: Thank you for choosing [facility] to provide your oncology and hematology care.  If you have a lab appointment with the [facility], please come in thru the Main Entrance and check in at the main information desk.  Wear comfortable clothing and clothing appropriate for easy access to any Portacath or PICC line.   We strive to give you quality time with your provider. You may need to reschedule your appointment if you arrive late (15 or more minutes).  Arriving late affects you and other patients whose appointments are after yours.  Also, if you miss three or more appointments without notifying the office, you may be dismissed from the clinic at the provider's discretion.      For prescription refill requests, have your pharmacy contact our office and allow 72 hours for refills to be completed.    Today you received the following chemotherapy and/or immunotherapy agents Padcev and Feraheme. Per Dr. Delton Coombes increase Lactulose to three times per day if constipation occurs. Return as scheduled.   To help prevent nausea and vomiting after your treatment, we encourage you to take your nausea medication as directed.  BELOW ARE SYMPTOMS THAT SHOULD BE REPORTED IMMEDIATELY: *FEVER GREATER THAN 100.4 F (38 C) OR HIGHER *CHILLS OR SWEATING *NAUSEA AND VOMITING THAT IS NOT CONTROLLED WITH YOUR NAUSEA MEDICATION *UNUSUAL SHORTNESS OF BREATH *UNUSUAL BRUISING OR BLEEDING *URINARY PROBLEMS (pain or burning when urinating, or frequent urination) *BOWEL PROBLEMS (unusual diarrhea, constipation, pain near the anus) TENDERNESS IN MOUTH AND THROAT WITH OR WITHOUT PRESENCE OF ULCERS (sore throat, sores in mouth, or a toothache) UNUSUAL RASH, SWELLING OR PAIN  UNUSUAL VAGINAL DISCHARGE OR ITCHING   Items with * indicate a potential emergency and should be followed up as soon as possible or go to the Emergency Department if any problems should  occur.  Please show the CHEMOTHERAPY ALERT CARD or IMMUNOTHERAPY ALERT CARD at check-in to the Emergency Department and triage nurse.  Should you have questions after your visit or need to cancel or reschedule your appointment, please contact [facility] [phone]  and follow the prompts.  Office hours are 8:00 a.m. to 4:30 p.m. Monday - Friday. Please note that voicemails left after 4:00 p.m. may not be returned until the following business day.  We are closed weekends and major holidays. You have access to a nurse at all times for urgent questions. Please call the main number to the clinic [phone] and follow the prompts.  For any non-urgent questions, you may also contact your provider using MyChart. We now offer e-Visits for anyone 64 and older to request care online for non-urgent symptoms. For details visit [email or web address].   Also download the MyChart app! Go to the app store, search "MyChart", open the app, select [facility], and log in with your MyChart username and password.  Due to Covid, a mask is required upon entering the hospital/clinic. If you do not have a mask, one will be given to you upon arrival. For doctor visits, patients may have 1 support person aged 31 or older with them. For treatment visits, patients cannot have anyone with them due to current Covid guidelines and our immunocompromised population.

## 2020-11-23 NOTE — Progress Notes (Signed)
Called patient for follow-up assessment, did not receive answer from patient.

## 2020-11-24 ENCOUNTER — Other Ambulatory Visit (HOSPITAL_COMMUNITY): Payer: Self-pay

## 2020-11-24 MED ORDER — MEGESTROL ACETATE 400 MG/10ML PO SUSP
400.0000 mg | Freq: Every day | ORAL | 3 refills | Status: DC
Start: 2020-11-24 — End: 2021-10-18

## 2020-11-25 NOTE — Progress Notes (Signed)
[place], [place] [address]   CLINIC:  Medical Oncology/Hematology  PCP:  Lindell Spar, MD [address] / [place] [address] [phone]   REASON FOR VISIT:  Follow-up for metastatic urothelial carcinoma  PRIOR THERAPY: Keytruda every 3 weeks  NGS Results: not done  CURRENT THERAPY: Enfortumab every 4 weeks  BRIEF ONCOLOGIC HISTORY:  Oncology History  Metastatic urothelial carcinoma ([place])  08/13/2020 Initial Diagnosis   Metastatic urothelial carcinoma ([facility])   08/13/2020 Cancer Staging   Staging form: Urinary Bladder, AJCC 8th Edition - Clinical stage from 08/13/2020: Stage IVB (cTX, cN0, pM1b) - Signed by Derek Jack, MD on 08/13/2020 Stage prefix: Initial diagnosis WHO/ISUP grade (low/high): High Grade Histologic grading system: 2 grade system   08/20/2020 - 10/22/2020 Chemotherapy          11/19/2020 -  Chemotherapy    Patient is on Treatment Plan: UROTHELIAL LOCALLY ADVANCED / METASTATIC ENFORTUMAB Q28D         CANCER STAGING: Cancer Staging Metastatic urothelial carcinoma ([facility]) Staging form: Urinary Bladder, AJCC 8th Edition - Clinical stage from 08/13/2020: Stage IVB (cTX, cN0, pM1b) - Signed by Derek Jack, MD on 08/13/2020   INTERVAL HISTORY:  [name], a 70 y.o. male, returns for routine follow-up and consideration for next cycle of chemotherapy. Jeremy Rodriguez was last seen on 11/12/2020.  Due for day #8 cycle #1 of Enfortumab today.   Overall, he tells me he has been feeling pretty well. He reports constipation that has not been helped by Linzess, stool softener, or lactulose. He has not had a BM in 1 week. He also reports pain in his side for which he takes about one 15 mg oxycodone tablet a day prn as well as about 3 ibuprofen prn. He reports good appetite and occasional tingling in his hands.     Overall, he feels ready for next cycle of chemo today.   REVIEW OF SYSTEMS:   Review of Systems  Constitutional:  Negative for appetite change and fatigue.  Gastrointestinal:  Positive for constipation.  Neurological:  Positive for numbness (tingling in hands).  All other systems reviewed and are negative.  PAST MEDICAL/SURGICAL HISTORY:  Past Medical History:  Diagnosis Date   Chronic kidney disease    GERD (gastroesophageal reflux disease)    Hypertension    Medical history non-contributory    Past Surgical History:  Procedure Laterality Date   appendectomy      SOCIAL HISTORY:  Social History   Socioeconomic History   Marital status: Single    Spouse name: Not on file   Number of children: Not on file   Years of education: Not on file   Highest education level: Not on file  Occupational History   Not on file  Tobacco Use   Smoking status: Some Days   Smokeless tobacco: Never   Tobacco comments:    a few cigarettes a day  Substance and Sexual Activity   Alcohol use: Yes    Comment: occ   Drug use: Never   Sexual activity: Not on file  Other Topics Concern   Not on file  Social History Narrative   Not on file   Social Determinants of Health   Financial Resource Strain: Low Risk    Difficulty of Paying Living Expenses: Not hard at all  Food Insecurity: No Food Insecurity   Worried About [place] in the Last Year: Never true   Ran Out  of Food in the Last Year: Never true  Transportation Needs: No Transportation Needs   Lack of Transportation (Medical): No   Lack of Transportation (Non-Medical): No  Physical Activity: Inactive   Days of Exercise per Week: 5 days   Minutes of Exercise per Session: 0 min  Stress: No Stress Concern Present   Feeling of Stress : Not at all  Social Connections: Moderately Isolated   Frequency of Communication with Friends and Family: More than three times a week   Frequency of Social Gatherings with Friends and Family: More than three times a week   Attends Religious Services: More than 4  times per year   Active Member of [employer] or Organizations: No   Attends Music therapist: Never   Marital Status: Divorced  Human resources officer Violence: Not At Risk   Fear of Current or Ex-Partner: No   Emotionally Abused: No   Physically Abused: No   Sexually Abused: No    FAMILY HISTORY:  Family History  Problem Relation Age of Onset   Colon cancer Neg Hx    Colon polyps Neg Hx    Liver cancer Neg Hx    Pancreatic cancer Neg Hx     CURRENT MEDICATIONS:  Current Outpatient Medications  Medication Sig Dispense Refill   amLODipine (NORVASC) 5 MG tablet Take 1 tablet (5 mg total) by mouth daily. 90 tablet 1   dicyclomine (BENTYL) 10 MG capsule Take 1 capsule (10 mg total) by mouth 3 (three) times daily as needed for spasms. 30 capsule 0   docusate sodium (COLACE) 100 MG capsule Take 1 capsule (100 mg total) by mouth daily as needed for mild constipation. 30 capsule 0   Lactulose 20 GM/30ML SOLN Take 30 mLs (20 g total) by mouth daily. 473 mL 6   megestrol (MEGACE) 400 MG/10ML suspension Take 10 mLs (400 mg total) by mouth daily. 480 mL 3   naloxegol oxalate (MOVANTIK) 25 MG TABS tablet Take 1 tablet (25 mg total) by mouth daily. 30 tablet 3   oxyCODONE (ROXICODONE) 15 MG immediate release tablet Take 1 tablet (15 mg total) by mouth every 6 (six) hours as needed for pain. 120 tablet 0   pantoprazole (PROTONIX) 40 MG tablet Take 1 tablet (40 mg total) by mouth daily. 30 tablet 3   Pembrolizumab (KEYTRUDA IV) Inject 200 mg into the vein every 21 ( twenty-one) days.     telmisartan (MICARDIS) 20 MG tablet Take 1 tablet (20 mg total) by mouth daily. 90 tablet 1   No current facility-administered medications for this visit.    ALLERGIES:  No Known Allergies  PHYSICAL EXAM:  Performance status (ECOG): 1 - Symptomatic but completely ambulatory  Vitals:   11/26/20 0942  BP: 132/74  Pulse: 92  Resp: 18  Temp: 98.2 F (36.8 C)  SpO2: 100%   Wt Readings from Last 3  Encounters:  11/19/20 144 lb (65.3 kg)  11/12/20 142 lb 13.7 oz (64.8 kg)  10/22/20 146 lb (66.2 kg)   Physical Exam Vitals reviewed.  Constitutional:      Appearance: Normal appearance.  Cardiovascular:     Rate and Rhythm: Normal rate and regular rhythm.     Pulses: Normal pulses.     Heart sounds: Normal heart sounds.  Pulmonary:     Effort: Pulmonary effort is normal.     Breath sounds: Normal breath sounds.  Neurological:     General: No focal deficit present.     Mental Status: He  is alert and oriented to person, place, and time.  Psychiatric:        Mood and Affect: Mood normal.        Behavior: Behavior normal.    LABORATORY DATA:  I have reviewed the labs as listed.  CBC Latest Ref Rng & Units 11/26/2020 11/19/2020 11/12/2020  WBC 4.0 - 10.5 K/uL 10.1 10.3 9.2  Hemoglobin 13.0 - 17.0 g/dL 11.4(L) 10.7(L) 10.6(L)  Hematocrit 39.0 - 52.0 % 36.0(L) 34.1(L) 34.8(L)  Platelets 150 - 400 K/uL 312 366 429(H)   CMP Latest Ref Rng & Units 11/26/2020 11/19/2020 11/12/2020  Glucose 70 - 99 mg/dL 134(H) 107(H) 174(H)  BUN 8 - 23 mg/dL '21 19 18  ' Creatinine 0.61 - 1.24 mg/dL 0.99 0.91 1.02  Sodium 135 - 145 mmol/L 133(L) 133(L) 133(L)  Potassium 3.5 - 5.1 mmol/L 4.2 4.3 4.7  Chloride 98 - 111 mmol/L 104 104 104  CO2 22 - 32 mmol/L 21(L) 22 22  Calcium 8.9 - 10.3 mg/dL 9.0 9.1 9.2  Total Protein 6.5 - 8.1 g/dL 7.4 7.1 7.4  Total Bilirubin 0.3 - 1.2 mg/dL 0.3 0.4 0.4  Alkaline Phos 38 - 126 U/L 62 56 68  AST 15 - 41 U/L '21 17 17  ' ALT 0 - 44 U/L 34 25 15    DIAGNOSTIC IMAGING:  I have independently reviewed the scans and discussed with the patient. CT CHEST ABDOMEN PELVIS W CONTRAST  Result Date: 11/12/2020 CLINICAL DATA:  Restaging metastatic urothelial carcinoma. EXAM: CT CHEST, ABDOMEN, AND PELVIS WITH CONTRAST TECHNIQUE: Multidetector CT imaging of the chest, abdomen and pelvis was performed following the standard protocol during bolus administration of intravenous contrast.  CONTRAST:  69m OMNIPAQUE IOHEXOL 350 MG/ML SOLN COMPARISON:  PET-CT 08/04/2020 FINDINGS: CT CHEST FINDINGS Cardiovascular: Normal heart size. No pericardial effusion. Aortic atherosclerosis. Mediastinum/Nodes: Normal appearance of the thyroid gland. The trachea appears patent and is midline. Normal appearance of the esophagus. No enlarged axillary, supraclavicular, mediastinal or hilar lymph nodes. Lungs/Pleura: Stiff multiple remote healed left lateral rib deformities are noted, likely posttraumatic. Musculoskeletal: Multiple remote healed left lateral rib fracture deformities. No acute or suspicious osseous findings. CT ABDOMEN PELVIS FINDINGS Hepatobiliary: Multifocal low-attenuation liver metastases are identified. These are increased in multiplicity and size compared with previous exam. Index lesions are best appreciated on the coronal images, including: Dominant index lesion within segment 4a measures 4.2 x 4.7 cm, image 78/4. On the MR from 07/20/2020 this lesion measured 2.1 x 1.8 cm. This now appears to encase and narrow the intrahepatic IVC, image 83/4. Lesion within the right hepatic lobe measures 2.2 x 2.1 cm, image 76/4. This was FDG avid on PET scan from 08/04/2020 but not visible on the corresponding CT or MRI from 07/20/2020. New lesion within segment 8/4 measures 1.8 x 1.4 cm, image 83/4. Several additional smaller lesions are noted within the posterior and inferior right hepatic lobe measuring less than 1 cm, axial images 57/2, 72/2, and 70/2. Gallbladder appears normal.  No bile duct dilatation. Pancreas: Unremarkable. No pancreatic ductal dilatation or surrounding inflammatory changes. Spleen: The spleen has a normal size. No signs of splenic metastasis. However, direct extension of tumor into the medial aspect of the upper spleen from the left kidney is noted as seen previously, image 52/2. Adrenals/Urinary Tract: Normal right adrenal gland and right kidney. The previously noted infiltrative  mass arising off the upper pole of the left kidney measures 7.8 by 5.9 by 7.0 cm, image 57/2. Not significantly changed from previous  exam. This completely encases the left adrenal gland. There is also encasement and marked narrowing of the left renal vein. Tumor extension into the left retroperitoneal fat is identified. The urinary bladder is unremarkable. Stomach/Bowel: Stomach appears normal. No bowel wall thickening, inflammation or distension. Vascular/Lymphatic: Aortic atherosclerosis. No aneurysm. No enlarged abdominopelvic lymph nodes. Reproductive: Prostate is unremarkable. Other: No free fluid or fluid collections identified. Musculoskeletal: Choose 1 IMPRESSION: 1. Interval progression of liver metastasis. 2. No significant change in size of infiltrative mass arising off the upper pole of left kidney compatible with known renal cell carcinoma. As noted previously this involves the medial aspect of the upper spleen, left renal vein, and left adrenal gland. Soft tissue infiltration into the left retroperitoneal fat is also noted. 3. No evidence for metastatic disease to the chest. 4. Aortic atherosclerosis. Aortic Atherosclerosis (ICD10-I70.0). Electronically Signed   By: Kerby Moors M.D.   On: 11/12/2020 11:33     ASSESSMENT:  1. 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 - 20 pound weight loss in the last 4 months due to decreased appetite.  Left upper quadrant pain for the past 2 to 3 months. - CTAP with contrast on 07/10/2020 showed ill-defined low-density mass in the right hepatic lobe measuring 2 x 2 cm.  Left upper pole kidney mass measuring 7.6 x 5.9 cm with encasement and narrowing of the left renal artery and vein.  No intravascular thrombus noted.  Mass encases the left adrenal gland.  Small right adrenal nodule measuring 11 mm, likely benign.  No other evidence of metastatic  disease. - MRI of the liver with and without contrast on 07/20/2020 shows 2.2 x 1.7 cm enhancing lesion in the medial aspect of the segment 7, suspicious for metastasis.  Left renal mass, 6 x 7.6 x 6.9 cm infiltrating enhancing left upper pole renal mass, extending into the left renal sinus.  Possible TCC or lymphoma rather than renal cell carcinoma. - CT chest on 07/20/2020 with no evidence of metastatic disease. - PET scan on 08/04/2020 showed large hypermetabolic left upper pole mass and 2 hypermetabolic lesions in the right hepatic lobe favoring metastatic disease.  No hypermetabolic adenopathy.  - Liver biopsy showed poorly differentiated metastatic urothelial carcinoma.  IHC positive for CK5/6, CK7, GATA3, p40 and patchy positivity with p63 and PAX8. - He refused any chemotherapy. - 4 cycles of pembrolizumab from 08/20/2020 through 10/22/2020 with progression. - NGS testing showed benefit from pembrolizumab.  No mutations involving FGFR, NTRK was found.  Test was limited due to small sample. - CT CAP on 11/11/2020 showed interval progression of liver metastasis.  No significant change infiltrative mass arising in the upper pole of the left kidney invading the medial aspect of the upper spleen, left renal vein and left adrenal gland with soft tissue infiltration to the left retroperitoneal fat.  No evidence of metastatic disease to the chest. - Enfortumab vedotin started on 11/19/2020   2.  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 - Reports exposure to carbon black dust.  Smoked half pack per day for 30 years, currently smoking 2 to 3 cigarettes/day. - No family history of malignancy.   PLAN:  1. Metastatic poorly differentiated urothelial carcinoma arising in the renal pelvis: - We have reviewed CT scan findings from 11/11/2020. - I have reviewed the regimen of enfortumab vedotin given days  1 8 and 15 every 28 days. - We will plan to continue at  least for 3 cycles before rescanning. - He tolerated first cycle reasonably well. - Due to severe constipation, I am discontinuing Aloxi and adding Zofran 8 mg in place of it.  If there is no improvement we will consider switching it to Compazine. - I reviewed his labs which showed normal LFTs.  TSH is 2.3.  He will proceed with day 8 of treatment today. - RTC 3 weeks prior to start of cycle 3.   2.  Left upper quadrant pain: - Continue oxycodone 15 mg 3-4 times daily.  3.  Constipation: - Continue Colace 2 tablets daily. - Continue Linzess daily.  He had samples from his GI doctor. - He reports that constipation is slightly worse.  Will increase lactulose to every 3 hours until bowel movement followed by 3 times daily. - We will discontinue Aloxi and change to Zofran 8 mg with the unfortunate. - Movantik was prescribed but was not filled due to high co-pay.  4.  Normocytic to microcytic anemia: - Received Feraheme on 11/19/2020 and 11/12/2020. - Hemoglobin today improved 11.4.  5.  Loss of appetite: - We started him on Megace 400 mg twice daily which is helping with appetite.   Orders placed this encounter:  No orders of the defined types were placed in this encounter.    Derek Jack, MD [place] [phone]   I, Thana Ates, am acting as a scribe for Dr. Derek Jack.  I, Derek Jack MD, have reviewed the above documentation for accuracy and completeness, and I agree with the above.

## 2020-11-26 ENCOUNTER — Other Ambulatory Visit: Payer: Self-pay

## 2020-11-26 ENCOUNTER — Inpatient Hospital Stay (HOSPITAL_BASED_OUTPATIENT_CLINIC_OR_DEPARTMENT_OTHER): Payer: Medicare Other | Admitting: Hematology

## 2020-11-26 ENCOUNTER — Inpatient Hospital Stay (HOSPITAL_COMMUNITY): Payer: Medicare Other

## 2020-11-26 VITALS — BP 132/74 | HR 92 | Temp 98.2°F | Resp 18

## 2020-11-26 VITALS — BP 130/56 | HR 65 | Temp 98.3°F | Resp 16

## 2020-11-26 DIAGNOSIS — N189 Chronic kidney disease, unspecified: Secondary | ICD-10-CM | POA: Diagnosis not present

## 2020-11-26 DIAGNOSIS — Z5112 Encounter for antineoplastic immunotherapy: Secondary | ICD-10-CM | POA: Diagnosis not present

## 2020-11-26 DIAGNOSIS — C7910 Secondary malignant neoplasm of unspecified urinary organs: Secondary | ICD-10-CM

## 2020-11-26 DIAGNOSIS — C787 Secondary malignant neoplasm of liver and intrahepatic bile duct: Secondary | ICD-10-CM | POA: Diagnosis not present

## 2020-11-26 DIAGNOSIS — C679 Malignant neoplasm of bladder, unspecified: Secondary | ICD-10-CM | POA: Diagnosis not present

## 2020-11-26 DIAGNOSIS — Z79899 Other long term (current) drug therapy: Secondary | ICD-10-CM | POA: Diagnosis not present

## 2020-11-26 DIAGNOSIS — C652 Malignant neoplasm of left renal pelvis: Secondary | ICD-10-CM | POA: Diagnosis not present

## 2020-11-26 DIAGNOSIS — D509 Iron deficiency anemia, unspecified: Secondary | ICD-10-CM | POA: Diagnosis not present

## 2020-11-26 DIAGNOSIS — Z9049 Acquired absence of other specified parts of digestive tract: Secondary | ICD-10-CM | POA: Diagnosis not present

## 2020-11-26 DIAGNOSIS — I129 Hypertensive chronic kidney disease with stage 1 through stage 4 chronic kidney disease, or unspecified chronic kidney disease: Secondary | ICD-10-CM | POA: Diagnosis not present

## 2020-11-26 LAB — COMPREHENSIVE METABOLIC PANEL
ALT: 34 U/L (ref 0–44)
AST: 21 U/L (ref 15–41)
Albumin: 3.5 g/dL (ref 3.5–5.0)
Alkaline Phosphatase: 62 U/L (ref 38–126)
Anion gap: 8 (ref 5–15)
BUN: 21 mg/dL (ref 8–23)
CO2: 21 mmol/L — ABNORMAL LOW (ref 22–32)
Calcium: 9 mg/dL (ref 8.9–10.3)
Chloride: 104 mmol/L (ref 98–111)
Creatinine, Ser: 0.99 mg/dL (ref 0.61–1.24)
GFR, Estimated: >60 mL/min (ref >60)
Glucose, Bld: 134 mg/dL — ABNORMAL HIGH (ref 70–99)
Potassium: 4.2 mmol/L (ref 3.5–5.1)
Sodium: 133 mmol/L — ABNORMAL LOW (ref 135–145)
Total Bilirubin: 0.3 mg/dL (ref 0.3–1.2)
Total Protein: 7.4 g/dL (ref 6.5–8.1)

## 2020-11-26 LAB — MAGNESIUM: Magnesium: 2 mg/dL (ref 1.7–2.4)

## 2020-11-26 LAB — CBC WITH DIFFERENTIAL/PLATELET
Abs Immature Granulocytes: 0.07 10*3/uL (ref 0.00–0.07)
Basophils Absolute: 0 10*3/uL (ref 0.0–0.1)
Basophils Relative: 0 %
Eosinophils Absolute: 0.1 10*3/uL (ref 0.0–0.5)
Eosinophils Relative: 1 %
HCT: 36 % — ABNORMAL LOW (ref 39.0–52.0)
Hemoglobin: 11.4 g/dL — ABNORMAL LOW (ref 13.0–17.0)
Immature Granulocytes: 1 %
Lymphocytes Relative: 15 %
Lymphs Abs: 1.5 10*3/uL (ref 0.7–4.0)
MCH: 24 pg — ABNORMAL LOW (ref 26.0–34.0)
MCHC: 31.7 g/dL (ref 30.0–36.0)
MCV: 75.8 fL — ABNORMAL LOW (ref 80.0–100.0)
Monocytes Absolute: 1 10*3/uL (ref 0.1–1.0)
Monocytes Relative: 10 %
Neutro Abs: 7.5 10*3/uL (ref 1.7–7.7)
Neutrophils Relative %: 73 %
Platelets: 312 10*3/uL (ref 150–400)
RBC: 4.75 MIL/uL (ref 4.22–5.81)
RDW: 22.3 % — ABNORMAL HIGH (ref 11.5–15.5)
WBC: 10.1 10*3/uL (ref 4.0–10.5)
nRBC: 0 % (ref 0.0–0.2)

## 2020-11-26 LAB — TSH: TSH: 2.376 u[IU]/mL (ref 0.350–4.500)

## 2020-11-26 MED ORDER — SODIUM CHLORIDE 0.9 % IV SOLN
Freq: Once | INTRAVENOUS | Status: AC
Start: 2020-11-26 — End: 2020-11-26

## 2020-11-26 MED ORDER — HEPARIN SOD (PORK) LOCK FLUSH 100 UNIT/ML IV SOLN: 500.0000 [IU] | Freq: Once | INTRAVENOUS | Status: DC | PRN

## 2020-11-26 MED ORDER — SODIUM CHLORIDE 0.9 % IV SOLN
1.2400 mg/kg | Freq: Once | INTRAVENOUS | Status: AC
  Administered 2020-11-26: 80 mg via INTRAVENOUS
  Filled 2020-11-26: qty 8

## 2020-11-26 MED ORDER — SODIUM CHLORIDE 0.9% FLUSH: 10.0000 mL | INTRAVENOUS | Status: DC | PRN

## 2020-11-26 MED ORDER — SODIUM CHLORIDE 0.9 % IV SOLN
8.0000 mg | Freq: Once | INTRAVENOUS | Status: AC
  Administered 2020-11-26: 8 mg via INTRAVENOUS
  Filled 2020-11-26: qty 8

## 2020-11-26 MED ORDER — SODIUM CHLORIDE 0.9 % IV SOLN
10.0000 mg | Freq: Once | INTRAVENOUS | Status: AC
  Administered 2020-11-26: 10 mg via INTRAVENOUS
  Filled 2020-11-26: qty 10

## 2020-11-26 NOTE — Progress Notes (Signed)
Patient has been assessed, vital signs and labs have been reviewed by Dr. Katragadda. ANC, Creatinine, LFTs, and Platelets are within treatment parameters per Dr. Katragadda. The patient is good to proceed with treatment at this time. Primary RN and pharmacy aware.  

## 2020-11-26 NOTE — Patient Instructions (Addendum)
[place] at [facility] Discharge Instructions  You were seen today by Dr. Delton Coombes. He went over your recent results, and you received your treatment. Take Lactulose every 3 hours as needed for your constipation. Dr. Delton Coombes will see you back in 3 weeks for labs and follow up.   Thank you for choosing [facility] at [facility] to provide your oncology and hematology care.  To afford each patient quality time with our provider, please arrive at least 15 minutes before your scheduled appointment time.   If you have a lab appointment with the [facility] please come in thru the Main Entrance and check in at the main information desk  You need to re-schedule your appointment should you arrive 10 or more minutes late.  We strive to give you quality time with our providers, and arriving late affects you and other patients whose appointments are after yours.  Also, if you no show three or more times for appointments you may be dismissed from the clinic at the providers discretion.     Again, thank you for choosing [facility].  Our hope is that these requests will decrease the amount of time that you wait before being seen by our physicians.       _____________________________________________________________  Should you have questions after your visit to [facility], please contact our office at [phone] between the hours of 8:00 a.m. and 4:30 p.m.  Voicemails left after 4:00 p.m. will not be returned until the following business day.  For prescription refill requests, have your pharmacy contact our office and allow 72 hours.    Cancer Center Support Programs:   > Cancer Support Group  2nd Tuesday of the month 1pm-2pm, Journey Room

## 2020-11-26 NOTE — Progress Notes (Signed)
Patient presents today for Padcev infusion per providers order.  Vital sign and labs within parameters for treatment.  Patients only complaint today is of constipation.  Peripheral IV started and blood return noted pre and post infusion.  Padcev infusion given today per MD orders.  Stable during infusion without adverse affects.  Vital signs stable.  No complaints at this time.  Discharge from clinic ambulatory in stable condition.  Alert and oriented X 3.  Follow up with [facility] as scheduled.

## 2020-11-26 NOTE — Patient Instructions (Signed)
[facility]  Discharge Instructions: Thank you for choosing [facility] to provide your oncology and hematology care.  If you have a lab appointment with the [facility], please come in thru the Main Entrance and check in at the main information desk.  Wear comfortable clothing and clothing appropriate for easy access to any Portacath or PICC line.   We strive to give you quality time with your provider. You may need to reschedule your appointment if you arrive late (15 or more minutes).  Arriving late affects you and other patients whose appointments are after yours.  Also, if you miss three or more appointments without notifying the office, you may be dismissed from the clinic at the provider's discretion.      For prescription refill requests, have your pharmacy contact our office and allow 72 hours for refills to be completed.    Today you received the following chemotherapy and/or immunotherapy agents Padcev      To help prevent nausea and vomiting after your treatment, we encourage you to take your nausea medication as directed.  BELOW ARE SYMPTOMS THAT SHOULD BE REPORTED IMMEDIATELY: *FEVER GREATER THAN 100.4 F (38 C) OR HIGHER *CHILLS OR SWEATING *NAUSEA AND VOMITING THAT IS NOT CONTROLLED WITH YOUR NAUSEA MEDICATION *UNUSUAL SHORTNESS OF BREATH *UNUSUAL BRUISING OR BLEEDING *URINARY PROBLEMS (pain or burning when urinating, or frequent urination) *BOWEL PROBLEMS (unusual diarrhea, constipation, pain near the anus) TENDERNESS IN MOUTH AND THROAT WITH OR WITHOUT PRESENCE OF ULCERS (sore throat, sores in mouth, or a toothache) UNUSUAL RASH, SWELLING OR PAIN  UNUSUAL VAGINAL DISCHARGE OR ITCHING   Items with * indicate a potential emergency and should be followed up as soon as possible or go to the Emergency Department if any problems should occur.  Please show the CHEMOTHERAPY ALERT CARD or IMMUNOTHERAPY ALERT CARD at check-in to the Emergency  Department and triage nurse.  Should you have questions after your visit or need to cancel or reschedule your appointment, please contact [facility] [phone]  and follow the prompts.  Office hours are 8:00 a.m. to 4:30 p.m. Monday - Friday. Please note that voicemails left after 4:00 p.m. may not be returned until the following business day.  We are closed weekends and major holidays. You have access to a nurse at all times for urgent questions. Please call the main number to the clinic [phone] and follow the prompts.  For any non-urgent questions, you may also contact your provider using MyChart. We now offer e-Visits for anyone 24 and older to request care online for non-urgent symptoms. For details visit [email or web address].   Also download the MyChart app! Go to the app store, search "MyChart", open the app, select [facility] [place], and log in with your MyChart username and password.  Due to Covid, a mask is required upon entering the hospital/clinic. If you do not have a mask, one will be given to you upon arrival. For doctor visits, patients may have 1 support person aged 26 or older with them. For treatment visits, patients cannot have anyone with them due to current Covid guidelines and our immunocompromised population.

## 2020-12-03 ENCOUNTER — Inpatient Hospital Stay (HOSPITAL_COMMUNITY): Payer: Medicare Other | Attending: Hematology

## 2020-12-03 ENCOUNTER — Other Ambulatory Visit: Payer: Self-pay

## 2020-12-03 ENCOUNTER — Other Ambulatory Visit (HOSPITAL_COMMUNITY): Payer: Medicare Other

## 2020-12-03 ENCOUNTER — Ambulatory Visit (HOSPITAL_COMMUNITY): Payer: Medicare Other | Admitting: Hematology

## 2020-12-03 ENCOUNTER — Inpatient Hospital Stay (HOSPITAL_COMMUNITY): Payer: Medicare Other

## 2020-12-03 ENCOUNTER — Ambulatory Visit (HOSPITAL_COMMUNITY): Payer: Medicare Other

## 2020-12-03 VITALS — BP 115/60 | HR 71 | Temp 98.4°F | Resp 18 | Wt 142.4 lb

## 2020-12-03 DIAGNOSIS — K5903 Drug induced constipation: Secondary | ICD-10-CM | POA: Insufficient documentation

## 2020-12-03 DIAGNOSIS — C787 Secondary malignant neoplasm of liver and intrahepatic bile duct: Secondary | ICD-10-CM | POA: Insufficient documentation

## 2020-12-03 DIAGNOSIS — Z5111 Encounter for antineoplastic chemotherapy: Secondary | ICD-10-CM | POA: Diagnosis not present

## 2020-12-03 DIAGNOSIS — C652 Malignant neoplasm of left renal pelvis: Secondary | ICD-10-CM | POA: Diagnosis not present

## 2020-12-03 DIAGNOSIS — C7910 Secondary malignant neoplasm of unspecified urinary organs: Secondary | ICD-10-CM

## 2020-12-03 DIAGNOSIS — Z79899 Other long term (current) drug therapy: Secondary | ICD-10-CM | POA: Insufficient documentation

## 2020-12-03 LAB — CBC WITH DIFFERENTIAL/PLATELET
Abs Immature Granulocytes: 0.03 10*3/uL (ref 0.00–0.07)
Basophils Absolute: 0 10*3/uL (ref 0.0–0.1)
Basophils Relative: 1 %
Eosinophils Absolute: 0.1 10*3/uL (ref 0.0–0.5)
Eosinophils Relative: 1 %
HCT: 36.5 % — ABNORMAL LOW (ref 39.0–52.0)
Hemoglobin: 11.7 g/dL — ABNORMAL LOW (ref 13.0–17.0)
Immature Granulocytes: 1 %
Lymphocytes Relative: 19 %
Lymphs Abs: 1.3 10*3/uL (ref 0.7–4.0)
MCH: 24 pg — ABNORMAL LOW (ref 26.0–34.0)
MCHC: 32.1 g/dL (ref 30.0–36.0)
MCV: 74.9 fL — ABNORMAL LOW (ref 80.0–100.0)
Monocytes Absolute: 0.9 10*3/uL (ref 0.1–1.0)
Monocytes Relative: 14 %
Neutro Abs: 4.3 10*3/uL (ref 1.7–7.7)
Neutrophils Relative %: 64 %
Platelets: 286 10*3/uL (ref 150–400)
RBC: 4.87 MIL/uL (ref 4.22–5.81)
RDW: 23.3 % — ABNORMAL HIGH (ref 11.5–15.5)
WBC: 6.6 10*3/uL (ref 4.0–10.5)
nRBC: 0 % (ref 0.0–0.2)

## 2020-12-03 LAB — COMPREHENSIVE METABOLIC PANEL
ALT: 33 U/L (ref 0–44)
AST: 23 U/L (ref 15–41)
Albumin: 3.4 g/dL — ABNORMAL LOW (ref 3.5–5.0)
Alkaline Phosphatase: 60 U/L (ref 38–126)
Anion gap: 8 (ref 5–15)
BUN: 21 mg/dL (ref 8–23)
CO2: 23 mmol/L (ref 22–32)
Calcium: 9.2 mg/dL (ref 8.9–10.3)
Chloride: 103 mmol/L (ref 98–111)
Creatinine, Ser: 1.02 mg/dL (ref 0.61–1.24)
GFR, Estimated: >60 mL/min (ref >60)
Glucose, Bld: 97 mg/dL (ref 70–99)
Potassium: 4.2 mmol/L (ref 3.5–5.1)
Sodium: 134 mmol/L — ABNORMAL LOW (ref 135–145)
Total Bilirubin: 0.4 mg/dL (ref 0.3–1.2)
Total Protein: 7.4 g/dL (ref 6.5–8.1)

## 2020-12-03 LAB — TSH: TSH: 3.298 u[IU]/mL (ref 0.350–4.500)

## 2020-12-03 LAB — MAGNESIUM: Magnesium: 2.1 mg/dL (ref 1.7–2.4)

## 2020-12-03 MED ORDER — SODIUM CHLORIDE 0.9 % IV SOLN
10.0000 mg | Freq: Once | INTRAVENOUS | Status: AC
  Administered 2020-12-03: 10 mg via INTRAVENOUS
  Filled 2020-12-03: qty 10

## 2020-12-03 MED ORDER — SODIUM CHLORIDE 0.9 % IV SOLN
1.2300 mg/kg | Freq: Once | INTRAVENOUS | Status: AC
  Administered 2020-12-03: 80 mg via INTRAVENOUS
  Filled 2020-12-03: qty 8

## 2020-12-03 MED ORDER — SODIUM CHLORIDE 0.9 % IV SOLN
8.0000 mg | Freq: Once | INTRAVENOUS | Status: AC
  Administered 2020-12-03: 8 mg via INTRAVENOUS
  Filled 2020-12-03: qty 8

## 2020-12-03 MED ORDER — SODIUM CHLORIDE 0.9 % IV SOLN: Freq: Once | INTRAVENOUS | Status: AC

## 2020-12-03 MED ORDER — METHYLNALTREXONE BROMIDE 12 MG/0.6ML ~~LOC~~ SOLN
12.0000 mg | Freq: Once | SUBCUTANEOUS | Status: AC
  Administered 2020-12-03: 12 mg via SUBCUTANEOUS
  Filled 2020-12-03: qty 0.6

## 2020-12-03 MED ORDER — PROCHLORPERAZINE MALEATE 10 MG PO TABS
10.0000 mg | ORAL_TABLET | Freq: Once | ORAL | Status: AC
  Administered 2020-12-03: 10 mg via ORAL
  Filled 2020-12-03: qty 1

## 2020-12-03 NOTE — Patient Instructions (Signed)
[facility]  Discharge Instructions: Thank you for choosing [facility] to provide your oncology and hematology care.  If you have a lab appointment with the Cancer Center, please come in thru the Main Entrance and check in at the main information desk.  Wear comfortable clothing and clothing appropriate for easy access to any Portacath or PICC line.   We strive to give you quality time with your provider. You may need to reschedule your appointment if you arrive late (15 or more minutes).  Arriving late affects you and other patients whose appointments are after yours.  Also, if you miss three or more appointments without notifying the office, you may be dismissed from the clinic at the provider's discretion.      For prescription refill requests, have your pharmacy contact our office and allow 72 hours for refills to be completed.        To help prevent nausea and vomiting after your treatment, we encourage you to take your nausea medication as directed.  BELOW ARE SYMPTOMS THAT SHOULD BE REPORTED IMMEDIATELY: *FEVER GREATER THAN 100.4 F (38 C) OR HIGHER *CHILLS OR SWEATING *NAUSEA AND VOMITING THAT IS NOT CONTROLLED WITH YOUR NAUSEA MEDICATION *UNUSUAL SHORTNESS OF BREATH *UNUSUAL BRUISING OR BLEEDING *URINARY PROBLEMS (pain or burning when urinating, or frequent urination) *BOWEL PROBLEMS (unusual diarrhea, constipation, pain near the anus) TENDERNESS IN MOUTH AND THROAT WITH OR WITHOUT PRESENCE OF ULCERS (sore throat, sores in mouth, or a toothache) UNUSUAL RASH, SWELLING OR PAIN  UNUSUAL VAGINAL DISCHARGE OR ITCHING   Items with * indicate a potential emergency and should be followed up as soon as possible or go to the Emergency Department if any problems should occur.  Please show the CHEMOTHERAPY ALERT CARD or IMMUNOTHERAPY ALERT CARD at check-in to the Emergency Department and triage nurse.  Should you have questions after your visit or need to cancel  or reschedule your appointment, please contact [facility] [phone]  and follow the prompts.  Office hours are 8:00 a.m. to 4:30 p.m. Monday - Friday. Please note that voicemails left after 4:00 p.m. may not be returned until the following business day.  We are closed weekends and major holidays. You have access to a nurse at all times for urgent questions. Please call the main number to the clinic [phone] and follow the prompts.  For any non-urgent questions, you may also contact your provider using MyChart. We now offer e-Visits for anyone 18 and older to request care online for non-urgent symptoms. For details visit [email or web address].   Also download the MyChart app! Go to the app store, search "MyChart", open the app, select [facility], and log in with your MyChart username and password.  Due to Covid, a mask is required upon entering the hospital/clinic. If you do not have a mask, one will be given to you upon arrival. For doctor visits, patients may have 1 support person aged 18 or older with them. For treatment visits, patients cannot have anyone with them due to current Covid guidelines and our immunocompromised population.  

## 2020-12-03 NOTE — Progress Notes (Signed)
Ok to treat today per MD. Labs reviewed.    Treatment given per orders. Patient tolerated it well without problems. Vitals stable and discharged home from clinic ambulatory. Follow up as scheduled.

## 2020-12-03 NOTE — Progress Notes (Signed)
Patient complaint of continued constipation.  Orders received for:  Relistor 12 mg SUBQ x 1 during clinic time  DC Ondansetron as premedication and add prochlorperazine 10 mg orally x 1 with each dose of Padcev.  T.O. Dr Rhys Martini, PharmD

## 2020-12-04 ENCOUNTER — Encounter (HOSPITAL_COMMUNITY): Payer: Self-pay | Admitting: *Deleted

## 2020-12-04 NOTE — Progress Notes (Signed)
I spoke with patient via telephone today.  He wanted to let us know about his evening after getting home.  He said that he had a bowel movement as soon as he got home from here yesterday. He said it was not a very large movement. He expected more.  He still feels bloated.  He says that he is eating okay but foods aren't as desirable to him.  He said sometimes they taste bad.  Patient asked about taking over the counter medications for his bloating and bowels.  I advised him to continue with his miralax, stool softeners and lactulose as prescribed.  He said he is taking gas ex and alkaseltzer for gas as well.  He said he gets some relief with the alka-seltzer but only for a few hours.  He wanted to know if he can take it more often than once per day.  I advised that he can take it according to the directions on the box.  He verbalizes understanding.    He asked about the megace and getting it covered through his insurance.  I advised him that the nurse that will work on that will be back on Tuesday and we will get it taken care of at that time.  He verbalizes understanding.

## 2020-12-07 ENCOUNTER — Other Ambulatory Visit (HOSPITAL_COMMUNITY): Payer: Self-pay

## 2020-12-07 DIAGNOSIS — R140 Abdominal distension (gaseous): Secondary | ICD-10-CM

## 2020-12-07 DIAGNOSIS — R109 Unspecified abdominal pain: Secondary | ICD-10-CM

## 2020-12-08 ENCOUNTER — Other Ambulatory Visit (HOSPITAL_COMMUNITY): Payer: Self-pay | Admitting: *Deleted

## 2020-12-08 ENCOUNTER — Encounter (HOSPITAL_COMMUNITY): Payer: Self-pay | Admitting: *Deleted

## 2020-12-08 ENCOUNTER — Other Ambulatory Visit (HOSPITAL_COMMUNITY): Payer: Self-pay

## 2020-12-08 DIAGNOSIS — R140 Abdominal distension (gaseous): Secondary | ICD-10-CM

## 2020-12-08 DIAGNOSIS — R109 Unspecified abdominal pain: Secondary | ICD-10-CM

## 2020-12-08 MED ORDER — DICYCLOMINE HCL 10 MG PO CAPS: 10.0000 mg | ORAL_CAPSULE | Freq: Three times a day (TID) | ORAL | 3 refills | Status: DC | PRN

## 2020-12-09 ENCOUNTER — Encounter (HOSPITAL_COMMUNITY): Payer: Self-pay | Admitting: *Deleted

## 2020-12-09 ENCOUNTER — Other Ambulatory Visit (HOSPITAL_COMMUNITY): Payer: Self-pay | Admitting: *Deleted

## 2020-12-09 MED ORDER — OXYCODONE HCL 15 MG PO TABS: 15.0000 mg | ORAL_TABLET | Freq: Four times a day (QID) | ORAL | 0 refills | Status: DC | PRN

## 2020-12-17 ENCOUNTER — Inpatient Hospital Stay (HOSPITAL_COMMUNITY): Payer: Medicare Other

## 2020-12-17 ENCOUNTER — Other Ambulatory Visit: Payer: Self-pay

## 2020-12-17 ENCOUNTER — Inpatient Hospital Stay (HOSPITAL_COMMUNITY): Payer: Medicare Other | Admitting: Hematology

## 2020-12-17 VITALS — BP 122/68 | HR 82 | Temp 97.9°F | Resp 18 | Wt 145.0 lb

## 2020-12-17 VITALS — BP 134/54 | HR 65 | Temp 96.8°F | Resp 18

## 2020-12-17 DIAGNOSIS — C7910 Secondary malignant neoplasm of unspecified urinary organs: Secondary | ICD-10-CM

## 2020-12-17 DIAGNOSIS — D509 Iron deficiency anemia, unspecified: Secondary | ICD-10-CM | POA: Diagnosis not present

## 2020-12-17 DIAGNOSIS — C787 Secondary malignant neoplasm of liver and intrahepatic bile duct: Secondary | ICD-10-CM | POA: Diagnosis not present

## 2020-12-17 DIAGNOSIS — C652 Malignant neoplasm of left renal pelvis: Secondary | ICD-10-CM

## 2020-12-17 DIAGNOSIS — Z79899 Other long term (current) drug therapy: Secondary | ICD-10-CM | POA: Diagnosis not present

## 2020-12-17 DIAGNOSIS — K5903 Drug induced constipation: Secondary | ICD-10-CM | POA: Diagnosis not present

## 2020-12-17 DIAGNOSIS — Z5111 Encounter for antineoplastic chemotherapy: Secondary | ICD-10-CM | POA: Diagnosis not present

## 2020-12-17 LAB — CBC WITH DIFFERENTIAL/PLATELET
Abs Immature Granulocytes: 0.07 10*3/uL (ref 0.00–0.07)
Basophils Absolute: 0 10*3/uL (ref 0.0–0.1)
Basophils Relative: 1 %
Eosinophils Absolute: 0 10*3/uL (ref 0.0–0.5)
Eosinophils Relative: 0 %
HCT: 35.6 % — ABNORMAL LOW (ref 39.0–52.0)
Hemoglobin: 11.6 g/dL — ABNORMAL LOW (ref 13.0–17.0)
Immature Granulocytes: 1 %
Lymphocytes Relative: 18 %
Lymphs Abs: 1.4 10*3/uL (ref 0.7–4.0)
MCH: 25.3 pg — ABNORMAL LOW (ref 26.0–34.0)
MCHC: 32.6 g/dL (ref 30.0–36.0)
MCV: 77.7 fL — ABNORMAL LOW (ref 80.0–100.0)
Monocytes Absolute: 0.9 10*3/uL (ref 0.1–1.0)
Monocytes Relative: 11 %
Neutro Abs: 5.5 10*3/uL (ref 1.7–7.7)
Neutrophils Relative %: 69 %
Platelets: 322 10*3/uL (ref 150–400)
RBC: 4.58 MIL/uL (ref 4.22–5.81)
RDW: 25.2 % — ABNORMAL HIGH (ref 11.5–15.5)
WBC: 7.9 10*3/uL (ref 4.0–10.5)
nRBC: 0 % (ref 0.0–0.2)

## 2020-12-17 LAB — COMPREHENSIVE METABOLIC PANEL
ALT: 23 U/L (ref 0–44)
AST: 19 U/L (ref 15–41)
Albumin: 3.3 g/dL — ABNORMAL LOW (ref 3.5–5.0)
Alkaline Phosphatase: 58 U/L (ref 38–126)
Anion gap: 5 (ref 5–15)
BUN: 20 mg/dL (ref 8–23)
CO2: 23 mmol/L (ref 22–32)
Calcium: 9.2 mg/dL (ref 8.9–10.3)
Chloride: 109 mmol/L (ref 98–111)
Creatinine, Ser: 0.98 mg/dL (ref 0.61–1.24)
GFR, Estimated: >60 mL/min (ref >60)
Glucose, Bld: 123 mg/dL — ABNORMAL HIGH (ref 70–99)
Potassium: 4.1 mmol/L (ref 3.5–5.1)
Sodium: 137 mmol/L (ref 135–145)
Total Bilirubin: 0.1 mg/dL — ABNORMAL LOW (ref 0.3–1.2)
Total Protein: 7.1 g/dL (ref 6.5–8.1)

## 2020-12-17 LAB — MAGNESIUM: Magnesium: 2.1 mg/dL (ref 1.7–2.4)

## 2020-12-17 MED ORDER — SODIUM CHLORIDE 0.9 % IV SOLN
8.0000 mg | Freq: Once | INTRAVENOUS | Status: AC
  Administered 2020-12-17: 8 mg via INTRAVENOUS
  Filled 2020-12-17: qty 8

## 2020-12-17 MED ORDER — SODIUM CHLORIDE 0.9 % IV SOLN
10.0000 mg | Freq: Once | INTRAVENOUS | Status: AC
  Administered 2020-12-17: 10 mg via INTRAVENOUS
  Filled 2020-12-17: qty 10

## 2020-12-17 MED ORDER — SODIUM CHLORIDE 0.9 % IV SOLN: Freq: Once | INTRAVENOUS | Status: AC

## 2020-12-17 MED ORDER — SODIUM CHLORIDE 0.9 % IV SOLN
1.2300 mg/kg | Freq: Once | INTRAVENOUS | Status: AC
  Administered 2020-12-17: 80 mg via INTRAVENOUS
  Filled 2020-12-17: qty 8

## 2020-12-17 NOTE — Progress Notes (Signed)
Patient has been examined, vital signs and labs have been reviewed by Dr. Katragadda. ANC, Creatinine, LFTs, hemoglobin, and platelets are within treatment parameters per Dr. Katragadda. Patient is okay to proceed with treatment per M.D.   

## 2020-12-17 NOTE — Progress Notes (Signed)
[place], [place] [address]   CLINIC:  Medical Oncology/Hematology  PCP:  Lindell Spar, MD [address] / [place] [address] [phone]   REASON FOR VISIT:  Follow-up for metastatic urothelial carcinoma  PRIOR THERAPY: Keytruda every 3 weeks  NGS Results: not done  CURRENT THERAPY: Enfortumab every 4 weeks  BRIEF ONCOLOGIC HISTORY:  Oncology History  Metastatic urothelial carcinoma ([place])  08/13/2020 Initial Diagnosis   Metastatic urothelial carcinoma ([place])   08/13/2020 Cancer Staging   Staging form: Urinary Bladder, AJCC 8th Edition - Clinical stage from 08/13/2020: Stage IVB (cTX, cN0, pM1b) - Signed by Derek Jack, MD on 08/13/2020 Stage prefix: Initial diagnosis WHO/ISUP grade (low/high): High Grade Histologic grading system: 2 grade system   08/20/2020 - 10/22/2020 Chemotherapy          11/19/2020 -  Chemotherapy    Patient is on Treatment Plan: UROTHELIAL LOCALLY ADVANCED / METASTATIC ENFORTUMAB Q28D         CANCER STAGING: Cancer Staging Metastatic urothelial carcinoma ([place]) Staging form: Urinary Bladder, AJCC 8th Edition - Clinical stage from 08/13/2020: Stage IVB (cTX, cN0, pM1b) - Signed by Derek Jack, MD on 08/13/2020   INTERVAL HISTORY:  [name], a 70 y.o. male, returns for routine follow-up and consideration for next cycle of immunotherapy. Jeremy Rodriguez was last seen on 11/26/2020.  Due for cycle #2 of Enfortumab today.   Overall, he tells me he has been feeling pretty well. He reports present but improved dull left flank pain, and he takes 2-4 oxycodone prn. He reports his constipation has improved following taking stool softener, and his appetite has improved following taking megace BID. His last BM was yesterday. His energy levels are good. He is not taking Linzess daily. He denies nausea, and reports occasional numbness in the fingertips of his right hand. He also  reports stable numbness and tingling in his feet in the mornings. He reports mild eye dryness, and he denies any vision changes.  Overall, he feels ready for next cycle of immunotherapy today.   REVIEW OF SYSTEMS:  Review of Systems  Constitutional:  Negative for appetite change and fatigue (80%).  Eyes:  Positive for eye problems (dry).  Gastrointestinal:  Negative for nausea. Constipation: improved. Musculoskeletal:  Positive for flank pain (4/10 L flank).  Neurological:  Positive for numbness (R fingertips).  All other systems reviewed and are negative.  PAST MEDICAL/SURGICAL HISTORY:  Past Medical History:  Diagnosis Date   Chronic kidney disease    GERD (gastroesophageal reflux disease)    Hypertension    Medical history non-contributory    Past Surgical History:  Procedure Laterality Date   appendectomy      SOCIAL HISTORY:  Social History   Socioeconomic History   Marital status: Single    Spouse name: Not on file   Number of children: Not on file   Years of education: Not on file   Highest education level: Not on file  Occupational History   Not on file  Tobacco Use   Smoking status: Some Days   Smokeless tobacco: Never   Tobacco comments:    a few cigarettes a day  Substance and Sexual Activity   Alcohol use: Yes    Comment: occ   Drug use: Never   Sexual activity: Not on file  Other Topics Concern   Not on file  Social History Narrative   Not on file   Social Determinants of Health  Financial Resource Strain: Low Risk    Difficulty of Paying Living Expenses: Not hard at all  Food Insecurity: No Food Insecurity   Worried About Charity fundraiser in the Last Year: Never true   Ran Out of Food in the Last Year: Never true  Transportation Needs: No Transportation Needs   Lack of Transportation (Medical): No   Lack of Transportation (Non-Medical): No  Physical Activity: Inactive   Days of Exercise per Week: 5 days   Minutes of Exercise per  Session: 0 min  Stress: No Stress Concern Present   Feeling of Stress : Not at all  Social Connections: Moderately Isolated   Frequency of Communication with Friends and Family: More than three times a week   Frequency of Social Gatherings with Friends and Family: More than three times a week   Attends Religious Services: More than 4 times per year   Active Member of [employer] or Organizations: No   Attends Music therapist: Never   Marital Status: Divorced  Human resources officer Violence: Not At Risk   Fear of Current or Ex-Partner: No   Emotionally Abused: No   Physically Abused: No   Sexually Abused: No    FAMILY HISTORY:  Family History  Problem Relation Age of Onset   Colon cancer Neg Hx    Colon polyps Neg Hx    Liver cancer Neg Hx    Pancreatic cancer Neg Hx     CURRENT MEDICATIONS:  Current Outpatient Medications  Medication Sig Dispense Refill   amLODipine (NORVASC) 5 MG tablet Take 1 tablet (5 mg total) by mouth daily. 90 tablet 1   Lactulose 20 GM/30ML SOLN Take 30 mLs (20 g total) by mouth daily. 473 mL 6   megestrol (MEGACE) 400 MG/10ML suspension Take 10 mLs (400 mg total) by mouth daily. 480 mL 3   naloxegol oxalate (MOVANTIK) 25 MG TABS tablet Take 1 tablet (25 mg total) by mouth daily. 30 tablet 3   pantoprazole (PROTONIX) 40 MG tablet Take 1 tablet (40 mg total) by mouth daily. 30 tablet 3   telmisartan (MICARDIS) 20 MG tablet Take 1 tablet (20 mg total) by mouth daily. 90 tablet 1   dicyclomine (BENTYL) 10 MG capsule Take 1 capsule (10 mg total) by mouth 3 (three) times daily as needed for spasms. (Patient not taking: Reported on 12/17/2020) 30 capsule 3   docusate sodium (COLACE) 100 MG capsule Take 1 capsule (100 mg total) by mouth daily as needed for mild constipation. (Patient not taking: Reported on 12/17/2020) 30 capsule 0   oxyCODONE (ROXICODONE) 15 MG immediate release tablet Take 1 tablet (15 mg total) by mouth every 6 (six) hours as needed for pain.  (Patient not taking: Reported on 12/17/2020) 120 tablet 0   No current facility-administered medications for this visit.    ALLERGIES:  No Known Allergies  PHYSICAL EXAM:  Performance status (ECOG): 1 - Symptomatic but completely ambulatory  Vitals:   12/17/20 0955  BP: 122/68  Pulse: 82  Resp: 18  Temp: 97.9 F (36.6 C)  SpO2: 98%   Wt Readings from Last 3 Encounters:  12/17/20 145 lb (65.8 kg)  12/03/20 142 lb 6.4 oz (64.6 kg)  11/19/20 144 lb (65.3 kg)   Physical Exam Vitals reviewed.  Constitutional:      Appearance: Normal appearance.  Cardiovascular:     Rate and Rhythm: Normal rate and regular rhythm.     Pulses: Normal pulses.     Heart sounds:  Normal heart sounds.  Pulmonary:     Effort: Pulmonary effort is normal.     Breath sounds: Normal breath sounds.  Abdominal:     Palpations: Abdomen is soft. There is no hepatomegaly, splenomegaly or mass.     Tenderness: There is no abdominal tenderness.  Neurological:     General: No focal deficit present.     Mental Status: He is alert and oriented to person, place, and time.  Psychiatric:        Mood and Affect: Mood normal.        Behavior: Behavior normal.    LABORATORY DATA:  I have reviewed the labs as listed.  CBC Latest Ref Rng & Units 12/17/2020 12/03/2020 11/26/2020  WBC 4.0 - 10.5 K/uL 7.9 6.6 10.1  Hemoglobin 13.0 - 17.0 g/dL 11.6(L) 11.7(L) 11.4(L)  Hematocrit 39.0 - 52.0 % 35.6(L) 36.5(L) 36.0(L)  Platelets 150 - 400 K/uL 322 286 312   CMP Latest Ref Rng & Units 12/17/2020 12/03/2020 11/26/2020  Glucose 70 - 99 mg/dL 123(H) 97 134(H)  BUN 8 - 23 mg/dL _0 Creatinine 0.61 - 1.24 mg/dL 0.98 1.02 0.99  Sodium 135 - 145 mmol/L 137 134(L) 133(L)  Potassium 3.5 - 5.1 mmol/L 4.1 4.2 4.2  Chloride 98 - 111 mmol/L 109 103 104  CO2 22 - 32 mmol/L 23 23 21(L)  Calcium 8.9 - 10.3 mg/dL 9.2 9.2 9.0  Total Protein 6.5 - 8.1 g/dL 7.1 7.4 7.4  Total Bilirubin 0.3 - 1.2 mg/dL 0.1(L) 0.4 0.3  Alkaline Phos  38 - 126 U/L 58 60 62  AST 15 - 41 U/L _1 ALT 0 - 44 U/L 23 33 34    DIAGNOSTIC IMAGING:  I have independently reviewed the scans and discussed with the patient. No results found.   ASSESSMENT:  1. 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 - 20 pound weight loss in the last 4 months due to decreased appetite.  Left upper quadrant pain for the past 2 to 3 months. - CTAP with contrast on 07/10/2020 showed ill-defined low-density mass in the right hepatic lobe measuring 2 x 2 cm.  Left upper pole kidney mass measuring 7.6 x 5.9 cm with encasement and narrowing of the left renal artery and vein.  No intravascular thrombus noted.  Mass encases the left adrenal gland.  Small right adrenal nodule measuring 11 mm, likely benign.  No other evidence of metastatic disease. - MRI of the liver with and without contrast on 07/20/2020 shows 2.2 x 1.7 cm enhancing lesion in the medial aspect of the segment 7, suspicious for metastasis.  Left renal mass, 6 x 7.6 x 6.9 cm infiltrating enhancing left upper pole renal mass, extending into the left renal sinus.  Possible TCC or lymphoma rather than renal cell carcinoma. - CT chest on 07/20/2020 with no evidence of metastatic disease. - PET scan on 08/04/2020 showed large hypermetabolic left upper pole mass and 2 hypermetabolic lesions in the right hepatic lobe favoring metastatic disease.  No hypermetabolic adenopathy.  - Liver biopsy showed poorly differentiated metastatic urothelial carcinoma.  IHC positive for CK5/6, CK7, GATA3, p40 and patchy positivity with p63 and PAX8. - He refused any chemotherapy. - 4 cycles of pembrolizumab from 08/20/2020 through 10/22/2020 with progression. - NGS testing showed benefit from pembrolizumab.  No mutations involving FGFR, NTRK was found.  Test was limited  due to small sample. - CT CAP on 11/11/2020 showed  interval progression of liver metastasis.  No significant change infiltrative mass arising in the upper pole of the left kidney invading the medial aspect of the upper spleen, left renal vein and left adrenal gland with soft tissue infiltration to the left retroperitoneal fat.  No evidence of metastatic disease to the chest. - Enfortumab vedotin started on 11/19/2020   2.  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 - Reports exposure to carbon black dust.  Smoked half pack per day for 30 years, currently smoking 2 to 3 cigarettes/day. - No family history of malignancy.   PLAN:  1. Metastatic poorly differentiated urothelial carcinoma arising in the renal pelvis: - He completed first cycle of enfortumab vedotin based on CT scan findings on 11/11/2020 which showed progression. - He does not report any cough or shortness of breath. - He does have developed right hand fingertips numb on and off.  We will closely monitor as this could be from an [place]. - He also reports slightly more dryness in the eyes.  No vision changes reported. - Reviewed labs from today which showed near normal CBC.  LFTs show albumin 3.3 and rest of the numbers were normal.  Creatinine was also normal. - He will start with cycle 2 today, 3 weeks on 1 week off.  RTC 4 weeks prior to start of cycle 3.  Plan to repeat scans after 3-4 cycles.   2.  Left upper quadrant pain: - Continue oxycodone 15 mg 2-4 times per day. - He reports neck pain is no longer sharp in nature.  3.  Constipation: - He started taking a new stool softener which is helping. - He has Linzess samples which he takes sparingly. - We have given Relistor last week which helped.  He had bowel movement yesterday.  He does not require any Relistor.  4.  Normocytic to microcytic anemia: - Received Feraheme on 11/19/2020 and 11/12/2020. - Hemoglobin today improved 11.6 although MCV 77.  5.  Loss of  appetite: - Continue Megace 400 mg twice daily.  It is improving his appetite.   Orders placed this encounter:  No orders of the defined types were placed in this encounter.    Derek Jack, MD [place] [phone]   I, Thana Ates, am acting as a scribe for Dr. Derek Jack.  I, Derek Jack MD, have reviewed the above documentation for accuracy and completeness, and I agree with the above.

## 2020-12-17 NOTE — Patient Instructions (Signed)
[facility] at [facility] Discharge Instructions  You were seen today by Dr. Katragadda. He went over your recent results, and you received your treatment. Dr. Katragadda will see you back in 1 month for labs and follow up.   Thank you for choosing  Can[facility] Center at [facility] to provide your oncology and hematology care.  To afford each patient quality time with our provider, please arrive at least 15 minutes before your scheduled appointment time.   If you have a lab appointment with the Cancer Center please come in thru the Main Entrance and check in at the main information desk  You need to re-schedule your appointment should you arrive 10 or more minutes late.  We strive to give you quality time with our providers, and arriving late affects you and other patients whose appointments are after yours.  Also, if you no show three or more times for appointments you may be dismissed from the clinic at the providers discretion.     Again, thank you for choosing [facility].  Our hope is that these requests will decrease the amount of time that you wait before being seen by our physicians.       _____________________________________________________________  Should you have questions after your visit to [facility], please contact our office at [phone] between the hours of 8:00 a.m. and 4:30 p.m.  Voicemails left after 4:00 p.m. will not be returned until the following business day.  For prescription refill requests, have your pharmacy contact our office and allow 72 hours.    Cancer Center Support Programs:   > Cancer Support Group  2nd Tuesday of the month 1pm-2pm, Journey Room   

## 2020-12-17 NOTE — Progress Notes (Signed)
Treatment given per orders. Patient tolerated it well without problems. Vitals stable and discharged home from clinic ambulatory. Follow up as scheduled.  

## 2020-12-17 NOTE — Progress Notes (Signed)
Patient presents today for Padcev infusion.  Patient is in satisfactory condition with no new complaints.  Vital signs are stable.  Labs were reviewed by Dr. Delton Coombes during his office visit.  We will proceed with treatment per MD orders.

## 2020-12-17 NOTE — Patient Instructions (Signed)
[facility]  Discharge Instructions: Thank you for choosing [facility] to provide your oncology and hematology care.  If you have a lab appointment with the Cancer Center, please come in thru the Main Entrance and check in at the main information desk.  Wear comfortable clothing and clothing appropriate for easy access to any Portacath or PICC line.   We strive to give you quality time with your provider. You may need to reschedule your appointment if you arrive late (15 or more minutes).  Arriving late affects you and other patients whose appointments are after yours.  Also, if you miss three or more appointments without notifying the office, you may be dismissed from the clinic at the provider's discretion.      For prescription refill requests, have your pharmacy contact our office and allow 72 hours for refills to be completed.        To help prevent nausea and vomiting after your treatment, we encourage you to take your nausea medication as directed.  BELOW ARE SYMPTOMS THAT SHOULD BE REPORTED IMMEDIATELY: *FEVER GREATER THAN 100.4 F (38 C) OR HIGHER *CHILLS OR SWEATING *NAUSEA AND VOMITING THAT IS NOT CONTROLLED WITH YOUR NAUSEA MEDICATION *UNUSUAL SHORTNESS OF BREATH *UNUSUAL BRUISING OR BLEEDING *URINARY PROBLEMS (pain or burning when urinating, or frequent urination) *BOWEL PROBLEMS (unusual diarrhea, constipation, pain near the anus) TENDERNESS IN MOUTH AND THROAT WITH OR WITHOUT PRESENCE OF ULCERS (sore throat, sores in mouth, or a toothache) UNUSUAL RASH, SWELLING OR PAIN  UNUSUAL VAGINAL DISCHARGE OR ITCHING   Items with * indicate a potential emergency and should be followed up as soon as possible or go to the Emergency Department if any problems should occur.  Please show the CHEMOTHERAPY ALERT CARD or IMMUNOTHERAPY ALERT CARD at check-in to the Emergency Department and triage nurse.  Should you have questions after your visit or need to cancel  or reschedule your appointment, please contact  CANCER CENTER [phone]  and follow the prompts.  Office hours are 8:00 a.m. to 4:30 p.m. Monday - Friday. Please note that voicemails left after 4:00 p.m. may not be returned until the following business day.  We are closed weekends and major holidays. You have access to a nurse at all times for urgent questions. Please call the main number to the clinic [phone] and follow the prompts.  For any non-urgent questions, you may also contact your provider using MyChart. We now offer e-Visits for anyone 18 and older to request care online for non-urgent symptoms. For details visit [email or web address].   Also download the MyChart app! Go to the app store, search "MyChart", open the app, select [facility], and log in with your MyChart username and password.  Due to Covid, a mask is required upon entering the hospital/clinic. If you do not have a mask, one will be given to you upon arrival. For doctor visits, patients may have 1 support person aged 18 or older with them. For treatment visits, patients cannot have anyone with them due to current Covid guidelines and our immunocompromised population.  

## 2020-12-19 ENCOUNTER — Encounter (HOSPITAL_COMMUNITY): Payer: Self-pay | Admitting: Hematology

## 2020-12-22 ENCOUNTER — Other Ambulatory Visit (HOSPITAL_COMMUNITY): Payer: Self-pay | Admitting: *Deleted

## 2020-12-22 DIAGNOSIS — C7910 Secondary malignant neoplasm of unspecified urinary organs: Secondary | ICD-10-CM

## 2020-12-24 ENCOUNTER — Other Ambulatory Visit: Payer: Self-pay

## 2020-12-24 ENCOUNTER — Inpatient Hospital Stay (HOSPITAL_COMMUNITY): Payer: Medicare Other

## 2020-12-24 VITALS — BP 123/62 | HR 64 | Temp 97.0°F | Resp 18 | Wt 142.4 lb

## 2020-12-24 DIAGNOSIS — C7910 Secondary malignant neoplasm of unspecified urinary organs: Secondary | ICD-10-CM

## 2020-12-24 DIAGNOSIS — K5903 Drug induced constipation: Secondary | ICD-10-CM | POA: Diagnosis not present

## 2020-12-24 DIAGNOSIS — Z79899 Other long term (current) drug therapy: Secondary | ICD-10-CM | POA: Diagnosis not present

## 2020-12-24 DIAGNOSIS — Z5111 Encounter for antineoplastic chemotherapy: Secondary | ICD-10-CM | POA: Diagnosis not present

## 2020-12-24 DIAGNOSIS — C787 Secondary malignant neoplasm of liver and intrahepatic bile duct: Secondary | ICD-10-CM | POA: Diagnosis not present

## 2020-12-24 DIAGNOSIS — C652 Malignant neoplasm of left renal pelvis: Secondary | ICD-10-CM | POA: Diagnosis not present

## 2020-12-24 LAB — CBC WITH DIFFERENTIAL/PLATELET
Abs Immature Granulocytes: 0.08 10*3/uL — ABNORMAL HIGH (ref 0.00–0.07)
Basophils Absolute: 0 10*3/uL (ref 0.0–0.1)
Basophils Relative: 0 %
Eosinophils Absolute: 0 10*3/uL (ref 0.0–0.5)
Eosinophils Relative: 0 %
HCT: 40.1 % (ref 39.0–52.0)
Hemoglobin: 13 g/dL (ref 13.0–17.0)
Immature Granulocytes: 1 %
Lymphocytes Relative: 20 %
Lymphs Abs: 1.8 10*3/uL (ref 0.7–4.0)
MCH: 25.3 pg — ABNORMAL LOW (ref 26.0–34.0)
MCHC: 32.4 g/dL (ref 30.0–36.0)
MCV: 78 fL — ABNORMAL LOW (ref 80.0–100.0)
Monocytes Absolute: 1 10*3/uL (ref 0.1–1.0)
Monocytes Relative: 11 %
Neutro Abs: 6 10*3/uL (ref 1.7–7.7)
Neutrophils Relative %: 68 %
Platelets: 283 10*3/uL (ref 150–400)
RBC: 5.14 MIL/uL (ref 4.22–5.81)
RDW: 26.2 % — ABNORMAL HIGH (ref 11.5–15.5)
WBC: 9 10*3/uL (ref 4.0–10.5)
nRBC: 0 % (ref 0.0–0.2)

## 2020-12-24 LAB — COMPREHENSIVE METABOLIC PANEL
ALT: 29 U/L (ref 0–44)
AST: 22 U/L (ref 15–41)
Albumin: 3.5 g/dL (ref 3.5–5.0)
Alkaline Phosphatase: 58 U/L (ref 38–126)
Anion gap: 8 (ref 5–15)
BUN: 17 mg/dL (ref 8–23)
CO2: 21 mmol/L — ABNORMAL LOW (ref 22–32)
Calcium: 9.2 mg/dL (ref 8.9–10.3)
Chloride: 105 mmol/L (ref 98–111)
Creatinine, Ser: 0.95 mg/dL (ref 0.61–1.24)
GFR, Estimated: >60 mL/min (ref >60)
Glucose, Bld: 108 mg/dL — ABNORMAL HIGH (ref 70–99)
Potassium: 4.2 mmol/L (ref 3.5–5.1)
Sodium: 134 mmol/L — ABNORMAL LOW (ref 135–145)
Total Bilirubin: 0.3 mg/dL (ref 0.3–1.2)
Total Protein: 7.2 g/dL (ref 6.5–8.1)

## 2020-12-24 LAB — TSH: TSH: 2.564 u[IU]/mL (ref 0.350–4.500)

## 2020-12-24 LAB — MAGNESIUM: Magnesium: 2.1 mg/dL (ref 1.7–2.4)

## 2020-12-24 MED ORDER — SODIUM CHLORIDE 0.9 % IV SOLN: Freq: Once | INTRAVENOUS | Status: AC

## 2020-12-24 MED ORDER — SODIUM CHLORIDE 0.9 % IV SOLN
10.0000 mg | Freq: Once | INTRAVENOUS | Status: AC
  Administered 2020-12-24: 10 mg via INTRAVENOUS
  Filled 2020-12-24: qty 10

## 2020-12-24 MED ORDER — SODIUM CHLORIDE 0.9 % IV SOLN
1.2300 mg/kg | Freq: Once | INTRAVENOUS | Status: AC
  Administered 2020-12-24: 80 mg via INTRAVENOUS
  Filled 2020-12-24: qty 8

## 2020-12-24 MED ORDER — SODIUM CHLORIDE 0.9 % IV SOLN
8.0000 mg | Freq: Once | INTRAVENOUS | Status: AC
  Administered 2020-12-24: 8 mg via INTRAVENOUS
  Filled 2020-12-24: qty 8

## 2020-12-24 NOTE — Progress Notes (Signed)
Patient presents today for Padcev infusion per providers order.  Vital signs and labs within parameters for treatment.  Patient has no new complaints at this time.  Peripheral IV started and blood return noted pre and post infusion.  Padcev infusion given today per MD orders.  Stable during infusion without adverse affects.  Vital signs stable.  No complaints at this time.  Discharge from clinic ambulatory in stable condition.  Alert and oriented X 3.  Follow up with [facility] as scheduled.

## 2020-12-24 NOTE — Patient Instructions (Signed)
[facility]  Discharge Instructions: Thank you for choosing [facility] to provide your oncology and hematology care.  If you have a lab appointment with the [facility], please come in thru the Main Entrance and check in at the main information desk.  Wear comfortable clothing and clothing appropriate for easy access to any Portacath or PICC line.   We strive to give you quality time with your provider. You may need to reschedule your appointment if you arrive late (15 or more minutes).  Arriving late affects you and other patients whose appointments are after yours.  Also, if you miss three or more appointments without notifying the office, you may be dismissed from the clinic at the provider's discretion.      For prescription refill requests, have your pharmacy contact our office and allow 72 hours for refills to be completed.    Today you received the following chemotherapy and/or immunotherapy agents Padcev      To help prevent nausea and vomiting after your treatment, we encourage you to take your nausea medication as directed.  BELOW ARE SYMPTOMS THAT SHOULD BE REPORTED IMMEDIATELY: *FEVER GREATER THAN 100.4 F (38 C) OR HIGHER *CHILLS OR SWEATING *NAUSEA AND VOMITING THAT IS NOT CONTROLLED WITH YOUR NAUSEA MEDICATION *UNUSUAL SHORTNESS OF BREATH *UNUSUAL BRUISING OR BLEEDING *URINARY PROBLEMS (pain or burning when urinating, or frequent urination) *BOWEL PROBLEMS (unusual diarrhea, constipation, pain near the anus) TENDERNESS IN MOUTH AND THROAT WITH OR WITHOUT PRESENCE OF ULCERS (sore throat, sores in mouth, or a toothache) UNUSUAL RASH, SWELLING OR PAIN  UNUSUAL VAGINAL DISCHARGE OR ITCHING   Items with * indicate a potential emergency and should be followed up as soon as possible or go to the Emergency Department if any problems should occur.  Please show the CHEMOTHERAPY ALERT CARD or IMMUNOTHERAPY ALERT CARD at check-in to the Emergency  Department and triage nurse.  Should you have questions after your visit or need to cancel or reschedule your appointment, please contact [facility] [phone]  and follow the prompts.  Office hours are 8:00 a.m. to 4:30 p.m. Monday - Friday. Please note that voicemails left after 4:00 p.m. may not be returned until the following business day.  We are closed weekends and major holidays. You have access to a nurse at all times for urgent questions. Please call the main number to the clinic [phone] and follow the prompts.  For any non-urgent questions, you may also contact your provider using MyChart. We now offer e-Visits for anyone 24 and older to request care online for non-urgent symptoms. For details visit [email or web address].   Also download the MyChart app! Go to the app store, search "MyChart", open the app, select [facility], and log in with your MyChart username and password.  Due to Covid, a mask is required upon entering the hospital/clinic. If you do not have a mask, one will be given to you upon arrival. For doctor visits, patients may have 1 support person aged 70 or older with them. For treatment visits, patients cannot have anyone with them due to current Covid guidelines and our immunocompromised population.

## 2020-12-31 ENCOUNTER — Inpatient Hospital Stay (HOSPITAL_COMMUNITY): Payer: Medicare Other

## 2020-12-31 ENCOUNTER — Other Ambulatory Visit: Payer: Self-pay

## 2020-12-31 VITALS — BP 125/66 | HR 75 | Temp 98.0°F | Resp 18 | Wt 147.0 lb

## 2020-12-31 DIAGNOSIS — C652 Malignant neoplasm of left renal pelvis: Secondary | ICD-10-CM | POA: Diagnosis not present

## 2020-12-31 DIAGNOSIS — C7910 Secondary malignant neoplasm of unspecified urinary organs: Secondary | ICD-10-CM

## 2020-12-31 DIAGNOSIS — K5903 Drug induced constipation: Secondary | ICD-10-CM | POA: Diagnosis not present

## 2020-12-31 DIAGNOSIS — Z79899 Other long term (current) drug therapy: Secondary | ICD-10-CM | POA: Diagnosis not present

## 2020-12-31 DIAGNOSIS — C787 Secondary malignant neoplasm of liver and intrahepatic bile duct: Secondary | ICD-10-CM | POA: Diagnosis not present

## 2020-12-31 DIAGNOSIS — Z5111 Encounter for antineoplastic chemotherapy: Secondary | ICD-10-CM | POA: Diagnosis not present

## 2020-12-31 LAB — COMPREHENSIVE METABOLIC PANEL
ALT: 26 U/L (ref 0–44)
AST: 22 U/L (ref 15–41)
Albumin: 3.4 g/dL — ABNORMAL LOW (ref 3.5–5.0)
Alkaline Phosphatase: 59 U/L (ref 38–126)
Anion gap: 9 (ref 5–15)
BUN: 20 mg/dL (ref 8–23)
CO2: 18 mmol/L — ABNORMAL LOW (ref 22–32)
Calcium: 9 mg/dL (ref 8.9–10.3)
Chloride: 108 mmol/L (ref 98–111)
Creatinine, Ser: 1.06 mg/dL (ref 0.61–1.24)
GFR, Estimated: >60 mL/min (ref >60)
Glucose, Bld: 171 mg/dL — ABNORMAL HIGH (ref 70–99)
Potassium: 3.5 mmol/L (ref 3.5–5.1)
Sodium: 135 mmol/L (ref 135–145)
Total Bilirubin: 0.5 mg/dL (ref 0.3–1.2)
Total Protein: 7.2 g/dL (ref 6.5–8.1)

## 2020-12-31 LAB — CBC WITH DIFFERENTIAL/PLATELET
Abs Immature Granulocytes: 0.04 10*3/uL (ref 0.00–0.07)
Basophils Absolute: 0 10*3/uL (ref 0.0–0.1)
Basophils Relative: 0 %
Eosinophils Absolute: 0.1 10*3/uL (ref 0.0–0.5)
Eosinophils Relative: 1 %
HCT: 36.9 % — ABNORMAL LOW (ref 39.0–52.0)
Hemoglobin: 12.5 g/dL — ABNORMAL LOW (ref 13.0–17.0)
Immature Granulocytes: 1 %
Lymphocytes Relative: 18 %
Lymphs Abs: 1.5 10*3/uL (ref 0.7–4.0)
MCH: 27.1 pg (ref 26.0–34.0)
MCHC: 33.9 g/dL (ref 30.0–36.0)
MCV: 80 fL (ref 80.0–100.0)
Monocytes Absolute: 0.9 10*3/uL (ref 0.1–1.0)
Monocytes Relative: 10 %
Neutro Abs: 5.9 10*3/uL (ref 1.7–7.7)
Neutrophils Relative %: 70 %
Platelets: 227 10*3/uL (ref 150–400)
RBC: 4.61 MIL/uL (ref 4.22–5.81)
RDW: 25.6 % — ABNORMAL HIGH (ref 11.5–15.5)
WBC: 8.3 10*3/uL (ref 4.0–10.5)
nRBC: 0 % (ref 0.0–0.2)

## 2020-12-31 LAB — TSH: TSH: 2.715 u[IU]/mL (ref 0.350–4.500)

## 2020-12-31 LAB — MAGNESIUM: Magnesium: 1.9 mg/dL (ref 1.7–2.4)

## 2020-12-31 MED ORDER — SODIUM CHLORIDE 0.9 % IV SOLN
8.0000 mg | Freq: Once | INTRAVENOUS | Status: AC
  Administered 2020-12-31: 8 mg via INTRAVENOUS
  Filled 2020-12-31: qty 8

## 2020-12-31 MED ORDER — SILDENAFIL CITRATE 50 MG PO TABS: 50.0000 mg | ORAL_TABLET | Freq: Every day | ORAL | 2 refills | Status: DC | PRN

## 2020-12-31 MED ORDER — SODIUM CHLORIDE 0.9 % IV SOLN
10.0000 mg | Freq: Once | INTRAVENOUS | Status: AC
  Administered 2020-12-31: 10 mg via INTRAVENOUS
  Filled 2020-12-31: qty 10

## 2020-12-31 MED ORDER — SODIUM CHLORIDE 0.9 % IV SOLN: Freq: Once | INTRAVENOUS | Status: AC

## 2020-12-31 MED ORDER — SODIUM CHLORIDE 0.9 % IV SOLN
1.2400 mg/kg | Freq: Once | INTRAVENOUS | Status: AC
  Administered 2020-12-31: 80 mg via INTRAVENOUS
  Filled 2020-12-31: qty 8

## 2020-12-31 NOTE — Patient Instructions (Signed)
[facility]  Discharge Instructions: Thank you for choosing [facility] to provide your oncology and hematology care.  If you have a lab appointment with the [facility], please come in thru the Main Entrance and check in at the main information desk.  Wear comfortable clothing and clothing appropriate for easy access to any Portacath or PICC line.   We strive to give you quality time with your provider. You may need to reschedule your appointment if you arrive late (15 or more minutes).  Arriving late affects you and other patients whose appointments are after yours.  Also, if you miss three or more appointments without notifying the office, you may be dismissed from the clinic at the provider's discretion.      For prescription refill requests, have your pharmacy contact our office and allow 72 hours for refills to be completed.    Today you received the following chemotherapy and/or immunotherapy agents Padcev.   To help prevent nausea and vomiting after your treatment, we encourage you to take your nausea medication as directed.  BELOW ARE SYMPTOMS THAT SHOULD BE REPORTED IMMEDIATELY: *FEVER GREATER THAN 100.4 F (38 C) OR HIGHER *CHILLS OR SWEATING *NAUSEA AND VOMITING THAT IS NOT CONTROLLED WITH YOUR NAUSEA MEDICATION *UNUSUAL SHORTNESS OF BREATH *UNUSUAL BRUISING OR BLEEDING *URINARY PROBLEMS (pain or burning when urinating, or frequent urination) *BOWEL PROBLEMS (unusual diarrhea, constipation, pain near the anus) TENDERNESS IN MOUTH AND THROAT WITH OR WITHOUT PRESENCE OF ULCERS (sore throat, sores in mouth, or a toothache) UNUSUAL RASH, SWELLING OR PAIN  UNUSUAL VAGINAL DISCHARGE OR ITCHING   Items with * indicate a potential emergency and should be followed up as soon as possible or go to the Emergency Department if any problems should occur.  Please show the CHEMOTHERAPY ALERT CARD or IMMUNOTHERAPY ALERT CARD at check-in to the Emergency Department  and triage nurse.  Should you have questions after your visit or need to cancel or reschedule your appointment, please contact [facility] [phone]  and follow the prompts.  Office hours are 8:00 a.m. to 4:30 p.m. Monday - Friday. Please note that voicemails left after 4:00 p.m. may not be returned until the following business day.  We are closed weekends and major holidays. You have access to a nurse at all times for urgent questions. Please call the main number to the clinic [phone] and follow the prompts.  For any non-urgent questions, you may also contact your provider using MyChart. We now offer e-Visits for anyone 46 and older to request care online for non-urgent symptoms. For details visit [email or web address].   Also download the MyChart app! Go to the app store, search "MyChart", open the app, select [facility], and log in with your MyChart username and password.  Due to Covid, a mask is required upon entering the hospital/clinic. If you do not have a mask, one will be given to you upon arrival. For doctor visits, patients may have 1 support person aged 49 or older with them. For treatment visits, patients cannot have anyone with them due to current Covid guidelines and our immunocompromised population.

## 2020-12-31 NOTE — Progress Notes (Signed)
Pt presents today for Padcev per provider's order. Vital signs and labs within normal parameters for treatment. Pt voiced no new complaints at this time.  Padcev given today per MD orders. Tolerated infusion without adverse affects. Vital signs stable. No complaints at this time. Discharged from clinic ambulatory in stable condition. Alert and oriented x 3. F/U with [facility] as scheduled.

## 2021-01-05 ENCOUNTER — Other Ambulatory Visit (HOSPITAL_COMMUNITY): Payer: Self-pay | Admitting: *Deleted

## 2021-01-05 DIAGNOSIS — R140 Abdominal distension (gaseous): Secondary | ICD-10-CM

## 2021-01-05 DIAGNOSIS — R109 Unspecified abdominal pain: Secondary | ICD-10-CM

## 2021-01-05 MED ORDER — DICYCLOMINE HCL 10 MG PO CAPS: 10.0000 mg | ORAL_CAPSULE | Freq: Three times a day (TID) | ORAL | 3 refills | Status: DC | PRN

## 2021-01-13 NOTE — Progress Notes (Signed)
Jeremy Rodriguez, [place] [id number]   CLINIC:  Medical Oncology/Hematology  PCP:  Lindell Spar, MD [address] / [place] [address] [phone]   REASON FOR VISIT:  Follow-up for metastatic urothelial carcinoma  PRIOR THERAPY: Keytruda every 3 weeks  NGS Results: not done  CURRENT THERAPY: Enfortumab every 4 weeks  BRIEF ONCOLOGIC HISTORY:  Oncology History  Metastatic urothelial carcinoma ([facility])  08/13/2020 Initial Diagnosis   Metastatic urothelial carcinoma ([place])   08/13/2020 Cancer Staging   Staging form: Urinary Bladder, AJCC 8th Edition - Clinical stage from 08/13/2020: Stage IVB (cTX, cN0, pM1b) - Signed by Derek Jack, MD on 08/13/2020 Stage prefix: Initial diagnosis WHO/ISUP grade (low/high): High Grade Histologic grading system: 2 grade system   08/20/2020 - 10/22/2020 Chemotherapy          11/19/2020 -  Chemotherapy   Patient is on Treatment Plan : UROTHELIAL LOCALLY ADVANCED / METASTATIC Enfortumab q28d       CANCER STAGING: Cancer Staging Metastatic urothelial carcinoma ([facility]) Staging form: Urinary Bladder, AJCC 8th Edition - Clinical stage from 08/13/2020: Stage IVB (cTX, cN0, pM1b) - Signed by Derek Jack, MD on 08/13/2020   INTERVAL HISTORY:  [name], a 70 y.o. male, returns for routine follow-up and consideration for next cycle of chemotherapy. Edouard was last seen on 12/17/2020.  Due for cycle #3 of Enfortumab today.   Overall, he tells me he has been feeling pretty well. He reports his left flank pain has improved, and he is taking oxycodone BID. Protonix has helped his indigestion. He denies vision changes. He reports tingling in his left fingertips for 30 seconds following treatment. He denies cough and reports SOB and wheezing upon exertion. He has not been taking amlodipine.   Overall, he feels ready for next cycle of chemo today.   REVIEW OF SYSTEMS:  Review of  Systems  Constitutional:  Negative for appetite change and fatigue.  Eyes:  Negative for eye problems.  Respiratory:  Positive for shortness of breath (with exertion) and wheezing. Negative for cough.   Musculoskeletal:  Positive for flank pain (L side improved).  Neurological:  Positive for numbness (L fingertips). Negative for headaches.  All other systems reviewed and are negative.  PAST MEDICAL/SURGICAL HISTORY:  Past Medical History:  Diagnosis Date   Chronic kidney disease    GERD (gastroesophageal reflux disease)    Hypertension    Medical history non-contributory    Past Surgical History:  Procedure Laterality Date   appendectomy      SOCIAL HISTORY:  Social History   Socioeconomic History   Marital status: Single    Spouse name: Not on file   Number of children: Not on file   Years of education: Not on file   Highest education level: Not on file  Occupational History   Not on file  Tobacco Use   Smoking status: Some Days   Smokeless tobacco: Never   Tobacco comments:    a few cigarettes a day  Substance and Sexual Activity   Alcohol use: Yes    Comment: occ   Drug use: Never   Sexual activity: Not on file  Other Topics Concern   Not on file  Social History Narrative   Not on file   Social Determinants of Health   Financial Resource Strain: Low Risk    Difficulty of Paying Living Expenses: Not hard at all  Food Insecurity: No Food Insecurity   Worried About  Running Out of Food in the Last Year: Never true   Ran Out of Food in the Last Year: Never true  Transportation Needs: No Transportation Needs   Lack of Transportation (Medical): No   Lack of Transportation (Non-Medical): No  Physical Activity: Inactive   Days of Exercise per Week: 5 days   Minutes of Exercise per Session: 0 min  Stress: No Stress Concern Present   Feeling of Stress : Not at all  Social Connections: Moderately Isolated   Frequency of Communication with Friends and Family: More  than three times a week   Frequency of Social Gatherings with Friends and Family: More than three times a week   Attends Religious Services: More than 4 times per year   Active Member of [employer] or Organizations: No   Attends Music therapist: Never   Marital Status: Divorced  Human resources officer Violence: Not At Risk   Fear of Current or Ex-Partner: No   Emotionally Abused: No   Physically Abused: No   Sexually Abused: No    FAMILY HISTORY:  Family History  Problem Relation Age of Onset   Colon cancer Neg Hx    Colon polyps Neg Hx    Liver cancer Neg Hx    Pancreatic cancer Neg Hx     CURRENT MEDICATIONS:  Current Outpatient Medications  Medication Sig Dispense Refill   amLODipine (NORVASC) 5 MG tablet Take 1 tablet (5 mg total) by mouth daily. 90 tablet 1   dicyclomine (BENTYL) 10 MG capsule Take 1 capsule (10 mg total) by mouth 3 (three) times daily as needed for spasms. 30 capsule 3   docusate sodium (COLACE) 100 MG capsule Take 1 capsule (100 mg total) by mouth daily as needed for mild constipation. 30 capsule 0   Lactulose 20 GM/30ML SOLN Take 30 mLs (20 g total) by mouth daily. 473 mL 6   megestrol (MEGACE) 400 MG/10ML suspension Take 10 mLs (400 mg total) by mouth daily. 480 mL 3   naloxegol oxalate (MOVANTIK) 25 MG TABS tablet Take 1 tablet (25 mg total) by mouth daily. 30 tablet 3   oxyCODONE (ROXICODONE) 15 MG immediate release tablet Take 1 tablet (15 mg total) by mouth every 6 (six) hours as needed for pain. (Patient not taking: Reported on 12/31/2020) 120 tablet 0   pantoprazole (PROTONIX) 40 MG tablet Take 1 tablet (40 mg total) by mouth daily. 30 tablet 3   sildenafil (VIAGRA) 50 MG tablet Take 1 tablet (50 mg total) by mouth daily as needed for erectile dysfunction. 10 tablet 2   telmisartan (MICARDIS) 20 MG tablet Take 1 tablet (20 mg total) by mouth daily. 90 tablet 1   No current facility-administered medications for this visit.    ALLERGIES:  No  Known Allergies  PHYSICAL EXAM:  Performance status (ECOG): 1 - Symptomatic but completely ambulatory  There were no vitals filed for this visit. Wt Readings from Last 3 Encounters:  12/31/20 147 lb (66.7 kg)  12/24/20 142 lb 6.4 oz (64.6 kg)  12/17/20 145 lb (65.8 kg)   Physical Exam Vitals reviewed.  Constitutional:      Appearance: Normal appearance.  Cardiovascular:     Rate and Rhythm: Normal rate and regular rhythm.     Pulses: Normal pulses.     Heart sounds: Normal heart sounds.  Pulmonary:     Effort: Pulmonary effort is normal.     Breath sounds: Normal breath sounds.  Neurological:     General: No focal  deficit present.     Mental Status: He is alert and oriented to person, place, and time.  Psychiatric:        Mood and Affect: Mood normal.        Behavior: Behavior normal.    LABORATORY DATA:  I have reviewed the labs as listed.  CBC Latest Ref Rng & Units 12/31/2020 12/24/2020 12/17/2020  WBC 4.0 - 10.5 K/uL 8.3 9.0 7.9  Hemoglobin 13.0 - 17.0 g/dL 12.5(L) 13.0 11.6(L)  Hematocrit 39.0 - 52.0 % 36.9(L) 40.1 35.6(L)  Platelets 150 - 400 K/uL 227 283 322   CMP Latest Ref Rng & Units 12/31/2020 12/24/2020 12/17/2020  Glucose 70 - 99 mg/dL 171(H) 108(H) 123(H)  BUN 8 - 23 mg/dL '20 17 20  ' Creatinine 0.61 - 1.24 mg/dL 1.06 0.95 0.98  Sodium 135 - 145 mmol/L 135 134(L) 137  Potassium 3.5 - 5.1 mmol/L 3.5 4.2 4.1  Chloride 98 - 111 mmol/L 108 105 109  CO2 22 - 32 mmol/L 18(L) 21(L) 23  Calcium 8.9 - 10.3 mg/dL 9.0 9.2 9.2  Total Protein 6.5 - 8.1 g/dL 7.2 7.2 7.1  Total Bilirubin 0.3 - 1.2 mg/dL 0.5 0.3 0.1(L)  Alkaline Phos 38 - 126 U/L 59 58 58  AST 15 - 41 U/L '22 22 19  ' ALT 0 - 44 U/L '26 29 23    ' DIAGNOSTIC IMAGING:  I have independently reviewed the scans and discussed with the patient. No results found.   ASSESSMENT:  1. 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 - 20 pound weight loss in the last 4 months due to decreased appetite.  Left upper quadrant pain for the past 2 to 3 months. - CTAP with contrast on 07/10/2020 showed ill-defined low-density mass in the right hepatic lobe measuring 2 x 2 cm.  Left upper pole kidney mass measuring 7.6 x 5.9 cm with encasement and narrowing of the left renal artery and vein.  No intravascular thrombus noted.  Mass encases the left adrenal gland.  Small right adrenal nodule measuring 11 mm, likely benign.  No other evidence of metastatic disease. - MRI of the liver with and without contrast on 07/20/2020 shows 2.2 x 1.7 cm enhancing lesion in the medial aspect of the segment 7, suspicious for metastasis.  Left renal mass, 6 x 7.6 x 6.9 cm infiltrating enhancing left upper pole renal mass, extending into the left renal sinus.  Possible TCC or lymphoma rather than renal cell carcinoma. - CT chest on 07/20/2020 with no evidence of metastatic disease. - PET scan on 08/04/2020 showed large hypermetabolic left upper pole mass and 2 hypermetabolic lesions in the right hepatic lobe favoring metastatic disease.  No hypermetabolic adenopathy.  - Liver biopsy showed poorly differentiated metastatic urothelial carcinoma.  IHC positive for CK5/6, CK7, GATA3, p40 and patchy positivity with p63 and PAX8. - He refused any chemotherapy. - 4 cycles of pembrolizumab from 08/20/2020 through 10/22/2020 with progression. - NGS testing showed benefit from pembrolizumab.  No mutations involving FGFR, NTRK was found.  Test was limited due to small sample. - CT CAP on 11/11/2020 showed interval progression of liver metastasis.  No significant change infiltrative mass arising in the upper pole of the left kidney invading the medial aspect of the upper spleen, left renal vein and left adrenal gland with soft tissue infiltration to the left retroperitoneal fat.  No evidence of metastatic  disease to the chest. - Enfortumab vedotin started  on 11/19/2020   2.  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 - Reports exposure to carbon black dust.  Smoked half pack per day for 30 years, currently smoking 2 to 3 cigarettes/day. - No family history of malignancy.   PLAN:  1. Metastatic poorly differentiated urothelial carcinoma arising in the renal pelvis: - He completed 2 cycles of enfortumab vedotin. - He has some blurring in the eyes which is stable.  No significant neuropathy symptoms.  He has left hand fingertip numbness lasting about 30 seconds after treatment. - Reviewed labs which showed normal LFTs and electrolytes.  CBC was grossly normal.  TSH was 2.63. - He will proceed with cycle 3 today.  RTC 4 weeks for follow-up.  We will plan to repeat CT CAP prior to next visit.    2.  Left upper quadrant pain: - Continue oxycodone 15 mg about 2 tablets daily. - He is alternating ibuprofen with it.  Pain is controlled.  He reports that pain is not as sharp as previously.  3.  Constipation: - Continue stool softener which is helping.  4.  Normocytic to microcytic anemia: - Status post Feraheme on 11/19/2020 and 11/12/2020. - Hemoglobin today is 14.4 with MCV 83.  5.  Loss of appetite: - Continue Megace 400 mg twice daily. - His appetite is better.   Orders placed this encounter:  No orders of the defined types were placed in this encounter.    Derek Jack, MD [phone]   I, Thana Ates, am acting as a scribe for Dr. Derek Jack.  I, Derek Jack MD, have reviewed the above documentation for accuracy and completeness, and I agree with the above.

## 2021-01-14 ENCOUNTER — Inpatient Hospital Stay (HOSPITAL_COMMUNITY): Payer: Medicare Other

## 2021-01-14 ENCOUNTER — Inpatient Hospital Stay (HOSPITAL_COMMUNITY): Payer: Medicare Other | Attending: Hematology

## 2021-01-14 ENCOUNTER — Inpatient Hospital Stay (HOSPITAL_COMMUNITY): Payer: Medicare Other | Admitting: Hematology

## 2021-01-14 ENCOUNTER — Other Ambulatory Visit: Payer: Self-pay

## 2021-01-14 VITALS — BP 120/70 | HR 80 | Temp 97.8°F | Resp 18

## 2021-01-14 VITALS — BP 113/76 | HR 94 | Temp 97.0°F | Resp 18 | Wt 143.3 lb

## 2021-01-14 DIAGNOSIS — R1012 Left upper quadrant pain: Secondary | ICD-10-CM | POA: Insufficient documentation

## 2021-01-14 DIAGNOSIS — R630 Anorexia: Secondary | ICD-10-CM | POA: Insufficient documentation

## 2021-01-14 DIAGNOSIS — Z5112 Encounter for antineoplastic immunotherapy: Secondary | ICD-10-CM | POA: Insufficient documentation

## 2021-01-14 DIAGNOSIS — K219 Gastro-esophageal reflux disease without esophagitis: Secondary | ICD-10-CM

## 2021-01-14 DIAGNOSIS — C688 Malignant neoplasm of overlapping sites of urinary organs: Secondary | ICD-10-CM | POA: Insufficient documentation

## 2021-01-14 DIAGNOSIS — D509 Iron deficiency anemia, unspecified: Secondary | ICD-10-CM | POA: Insufficient documentation

## 2021-01-14 DIAGNOSIS — C7910 Secondary malignant neoplasm of unspecified urinary organs: Secondary | ICD-10-CM | POA: Diagnosis not present

## 2021-01-14 DIAGNOSIS — Z79899 Other long term (current) drug therapy: Secondary | ICD-10-CM | POA: Diagnosis not present

## 2021-01-14 DIAGNOSIS — C652 Malignant neoplasm of left renal pelvis: Secondary | ICD-10-CM

## 2021-01-14 DIAGNOSIS — K5900 Constipation, unspecified: Secondary | ICD-10-CM | POA: Insufficient documentation

## 2021-01-14 DIAGNOSIS — F17210 Nicotine dependence, cigarettes, uncomplicated: Secondary | ICD-10-CM | POA: Diagnosis not present

## 2021-01-14 LAB — TSH: TSH: 2.639 u[IU]/mL (ref 0.350–4.500)

## 2021-01-14 LAB — COMPREHENSIVE METABOLIC PANEL
ALT: 29 U/L (ref 0–44)
AST: 21 U/L (ref 15–41)
Albumin: 3.7 g/dL (ref 3.5–5.0)
Alkaline Phosphatase: 62 U/L (ref 38–126)
Anion gap: 7 (ref 5–15)
BUN: 17 mg/dL (ref 8–23)
CO2: 23 mmol/L (ref 22–32)
Calcium: 9.3 mg/dL (ref 8.9–10.3)
Chloride: 106 mmol/L (ref 98–111)
Creatinine, Ser: 1.02 mg/dL (ref 0.61–1.24)
GFR, Estimated: >60 mL/min (ref >60)
Glucose, Bld: 120 mg/dL — ABNORMAL HIGH (ref 70–99)
Potassium: 4.5 mmol/L (ref 3.5–5.1)
Sodium: 136 mmol/L (ref 135–145)
Total Bilirubin: 0.7 mg/dL (ref 0.3–1.2)
Total Protein: 7.8 g/dL (ref 6.5–8.1)

## 2021-01-14 LAB — CBC WITH DIFFERENTIAL/PLATELET
Abs Immature Granulocytes: 0.08 10*3/uL — ABNORMAL HIGH (ref 0.00–0.07)
Basophils Absolute: 0 10*3/uL (ref 0.0–0.1)
Basophils Relative: 0 %
Eosinophils Absolute: 0 10*3/uL (ref 0.0–0.5)
Eosinophils Relative: 0 %
HCT: 42.3 % (ref 39.0–52.0)
Hemoglobin: 14.4 g/dL (ref 13.0–17.0)
Immature Granulocytes: 1 %
Lymphocytes Relative: 19 %
Lymphs Abs: 1.8 10*3/uL (ref 0.7–4.0)
MCH: 28.5 pg (ref 26.0–34.0)
MCHC: 34 g/dL (ref 30.0–36.0)
MCV: 83.8 fL (ref 80.0–100.0)
Monocytes Absolute: 1 10*3/uL (ref 0.1–1.0)
Monocytes Relative: 11 %
Neutro Abs: 6.5 10*3/uL (ref 1.7–7.7)
Neutrophils Relative %: 69 %
Platelets: 318 10*3/uL (ref 150–400)
RBC: 5.05 MIL/uL (ref 4.22–5.81)
RDW: 23.9 % — ABNORMAL HIGH (ref 11.5–15.5)
WBC: 9.4 10*3/uL (ref 4.0–10.5)
nRBC: 0 % (ref 0.0–0.2)

## 2021-01-14 LAB — MAGNESIUM: Magnesium: 2 mg/dL (ref 1.7–2.4)

## 2021-01-14 MED ORDER — SODIUM CHLORIDE 0.9 % IV SOLN
1.2400 mg/kg | Freq: Once | INTRAVENOUS | Status: AC
  Administered 2021-01-14: 80 mg via INTRAVENOUS
  Filled 2021-01-14: qty 8

## 2021-01-14 MED ORDER — TADALAFIL 10 MG PO TABS: 10.0000 mg | ORAL_TABLET | Freq: Every day | ORAL | 0 refills | Status: DC | PRN

## 2021-01-14 MED ORDER — PANTOPRAZOLE SODIUM 40 MG PO TBEC
40.0000 mg | DELAYED_RELEASE_TABLET | Freq: Every day | ORAL | 6 refills | Status: DC
Start: 2021-01-14 — End: 2021-06-22

## 2021-01-14 MED ORDER — SODIUM CHLORIDE 0.9 % IV SOLN
8.0000 mg | Freq: Once | INTRAVENOUS | Status: AC
  Administered 2021-01-14: 8 mg via INTRAVENOUS
  Filled 2021-01-14: qty 8

## 2021-01-14 MED ORDER — SODIUM CHLORIDE 0.9 % IV SOLN: Freq: Once | INTRAVENOUS | Status: AC

## 2021-01-14 MED ORDER — SODIUM CHLORIDE 0.9 % IV SOLN
10.0000 mg | Freq: Once | INTRAVENOUS | Status: AC
  Administered 2021-01-14: 10 mg via INTRAVENOUS
  Filled 2021-01-14: qty 10

## 2021-01-14 MED ORDER — SODIUM CHLORIDE 0.9% FLUSH
10.0000 mL | INTRAVENOUS | Status: DC | PRN
  Administered 2021-01-14: 10 mL

## 2021-01-14 NOTE — Progress Notes (Signed)
Patient has been examined, vital signs and labs have been reviewed by Dr. Katragadda. ANC, Creatinine, LFTs, hemoglobin, and platelets are within treatment parameters per Dr. Katragadda. Patient may proceed with treatment per M.D.   

## 2021-01-14 NOTE — Progress Notes (Signed)
Patient tolerated chemotherapy with no complaints voiced.  Side effects with management reviewed with understanding verbalized.  Peripheral IV site clean and dry with no bruising or swelling noted at site.  Good blood return noted before and after administration of chemotherapy.  Band aid applied.  Patient left in satisfactory condition with VSS and no s/s of distress noted.

## 2021-01-14 NOTE — Patient Instructions (Signed)
[facility]  Discharge Instructions: Thank you for choosing [facility] to provide your oncology and hematology care.  If you have a lab appointment with the [facility], please come in thru the Main Entrance and check in at the main information desk.  Wear comfortable clothing and clothing appropriate for easy access to any Portacath or PICC line.   We strive to give you quality time with your provider. You may need to reschedule your appointment if you arrive late (15 or more minutes).  Arriving late affects you and other patients whose appointments are after yours.  Also, if you miss three or more appointments without notifying the office, you may be dismissed from the clinic at the provider's discretion.      For prescription refill requests, have your pharmacy contact our office and allow 72 hours for refills to be completed.    Today you received the following chemotherapy and/or immunotherapy agents Padcev.    To help prevent nausea and vomiting after your treatment, we encourage you to take your nausea medication as directed.  BELOW ARE SYMPTOMS THAT SHOULD BE REPORTED IMMEDIATELY: *FEVER GREATER THAN 100.4 F (38 C) OR HIGHER *CHILLS OR SWEATING *NAUSEA AND VOMITING THAT IS NOT CONTROLLED WITH YOUR NAUSEA MEDICATION *UNUSUAL SHORTNESS OF BREATH *UNUSUAL BRUISING OR BLEEDING *URINARY PROBLEMS (pain or burning when urinating, or frequent urination) *BOWEL PROBLEMS (unusual diarrhea, constipation, pain near the anus) TENDERNESS IN MOUTH AND THROAT WITH OR WITHOUT PRESENCE OF ULCERS (sore throat, sores in mouth, or a toothache) UNUSUAL RASH, SWELLING OR PAIN  UNUSUAL VAGINAL DISCHARGE OR ITCHING   Items with * indicate a potential emergency and should be followed up as soon as possible or go to the Emergency Department if any problems should occur.  Please show the CHEMOTHERAPY ALERT CARD or IMMUNOTHERAPY ALERT CARD at check-in to the Emergency Department  and triage nurse.  Should you have questions after your visit or need to cancel or reschedule your appointment, please contact [facility] [phone]  and follow the prompts.  Office hours are 8:00 a.m. to 4:30 p.m. Monday - Friday. Please note that voicemails left after 4:00 p.m. may not be returned until the following business day.  We are closed weekends and major holidays. You have access to a nurse at all times for urgent questions. Please call the main number to the clinic [phone] and follow the prompts.  For any non-urgent questions, you may also contact your provider using MyChart. We now offer e-Visits for anyone 70 and older to request care online for non-urgent symptoms. For details visit [email or web address].   Also download the MyChart app! Go to the app store, search "MyChart", open the app, select [facility], and log in with your MyChart username and password.  Due to Covid, a mask is required upon entering the hospital/clinic. If you do not have a mask, one will be given to you upon arrival. For doctor visits, patients may have 1 support person aged 70 or older with them. For treatment visits, patients cannot have anyone with them due to current Covid guidelines and our immunocompromised population.

## 2021-01-14 NOTE — Patient Instructions (Signed)
[place] at [facility] Discharge Instructions  You were seen and examined by Dr. Delton Coombes. You will receive Padcev infusion today. Return as scheduled for lab work, office visit, and treatments.   Thank you for choosing [place] at [facility] to provide your oncology and hematology care.  To afford each patient quality time with our provider, please arrive at least 15 minutes before your scheduled appointment time.   If you have a lab appointment with the Lead please come in thru the Main Entrance and check in at the main information desk.  You need to re-schedule your appointment should you arrive 10 or more minutes late.  We strive to give you quality time with our providers, and arriving late affects you and other patients whose appointments are after yours.  Also, if you no show three or more times for appointments you may be dismissed from the clinic at the providers discretion.     Again, thank you for choosing [facility].  Our hope is that these requests will decrease the amount of time that you wait before being seen by our physicians.       _____________________________________________________________  Should you have questions after your visit to [facility], please contact our office at [phone] and follow the prompts.  Our office hours are 8:00 a.m. and 4:30 p.m. Monday - Friday.  Please note that voicemails left after 4:00 p.m. may not be returned until the following business day.  We are closed weekends and major holidays.  You do have access to a nurse 24-7, just call the main number to the clinic [phone] and do not press any options, hold on the line and a nurse will answer the phone.    For prescription refill requests, have your pharmacy contact our office and allow 72 hours.    Due to Covid, you will need to wear a mask upon entering the hospital. If you do not have a mask, a  mask will be given to you at the Main Entrance upon arrival. For doctor visits, patients may have 1 support person age 23 or older with them. For treatment visits, patients can not have anyone with them due to social distancing guidelines and our immunocompromised population.

## 2021-01-15 ENCOUNTER — Encounter (HOSPITAL_COMMUNITY): Payer: Self-pay | Admitting: Hematology

## 2021-01-17 ENCOUNTER — Other Ambulatory Visit (HOSPITAL_COMMUNITY): Payer: Self-pay

## 2021-01-18 ENCOUNTER — Encounter (HOSPITAL_COMMUNITY): Payer: Self-pay | Admitting: Hematology

## 2021-01-18 MED ORDER — OXYCODONE HCL 15 MG PO TABS: 15.0000 mg | ORAL_TABLET | Freq: Four times a day (QID) | ORAL | 0 refills | Status: DC | PRN

## 2021-01-21 ENCOUNTER — Inpatient Hospital Stay (HOSPITAL_COMMUNITY): Payer: Medicare Other

## 2021-01-21 ENCOUNTER — Other Ambulatory Visit: Payer: Self-pay

## 2021-01-21 VITALS — BP 124/66 | HR 81 | Temp 98.2°F | Resp 18 | Wt 144.8 lb

## 2021-01-21 DIAGNOSIS — C688 Malignant neoplasm of overlapping sites of urinary organs: Secondary | ICD-10-CM | POA: Diagnosis not present

## 2021-01-21 DIAGNOSIS — C7910 Secondary malignant neoplasm of unspecified urinary organs: Secondary | ICD-10-CM

## 2021-01-21 DIAGNOSIS — Z79899 Other long term (current) drug therapy: Secondary | ICD-10-CM | POA: Diagnosis not present

## 2021-01-21 DIAGNOSIS — F17210 Nicotine dependence, cigarettes, uncomplicated: Secondary | ICD-10-CM | POA: Diagnosis not present

## 2021-01-21 DIAGNOSIS — D509 Iron deficiency anemia, unspecified: Secondary | ICD-10-CM | POA: Diagnosis not present

## 2021-01-21 DIAGNOSIS — K5900 Constipation, unspecified: Secondary | ICD-10-CM | POA: Diagnosis not present

## 2021-01-21 DIAGNOSIS — Z5112 Encounter for antineoplastic immunotherapy: Secondary | ICD-10-CM | POA: Diagnosis not present

## 2021-01-21 DIAGNOSIS — R1012 Left upper quadrant pain: Secondary | ICD-10-CM | POA: Diagnosis not present

## 2021-01-21 LAB — CBC WITH DIFFERENTIAL/PLATELET
Abs Immature Granulocytes: 0.07 10*3/uL (ref 0.00–0.07)
Basophils Absolute: 0 10*3/uL (ref 0.0–0.1)
Basophils Relative: 1 %
Eosinophils Absolute: 0 10*3/uL (ref 0.0–0.5)
Eosinophils Relative: 1 %
HCT: 42.4 % (ref 39.0–52.0)
Hemoglobin: 14.3 g/dL (ref 13.0–17.0)
Immature Granulocytes: 1 %
Lymphocytes Relative: 23 %
Lymphs Abs: 1.9 10*3/uL (ref 0.7–4.0)
MCH: 28.4 pg (ref 26.0–34.0)
MCHC: 33.7 g/dL (ref 30.0–36.0)
MCV: 84.1 fL (ref 80.0–100.0)
Monocytes Absolute: 1.1 10*3/uL — ABNORMAL HIGH (ref 0.1–1.0)
Monocytes Relative: 13 %
Neutro Abs: 5.3 10*3/uL (ref 1.7–7.7)
Neutrophils Relative %: 61 %
Platelets: 313 10*3/uL (ref 150–400)
RBC: 5.04 MIL/uL (ref 4.22–5.81)
RDW: 21.8 % — ABNORMAL HIGH (ref 11.5–15.5)
WBC: 8.4 10*3/uL (ref 4.0–10.5)
nRBC: 0 % (ref 0.0–0.2)

## 2021-01-21 LAB — COMPREHENSIVE METABOLIC PANEL
ALT: 32 U/L (ref 0–44)
AST: 27 U/L (ref 15–41)
Albumin: 3.7 g/dL (ref 3.5–5.0)
Alkaline Phosphatase: 67 U/L (ref 38–126)
Anion gap: 7 (ref 5–15)
BUN: 17 mg/dL (ref 8–23)
CO2: 22 mmol/L (ref 22–32)
Calcium: 9.5 mg/dL (ref 8.9–10.3)
Chloride: 104 mmol/L (ref 98–111)
Creatinine, Ser: 1.03 mg/dL (ref 0.61–1.24)
GFR, Estimated: >60 mL/min (ref >60)
Glucose, Bld: 171 mg/dL — ABNORMAL HIGH (ref 70–99)
Potassium: 3.9 mmol/L (ref 3.5–5.1)
Sodium: 133 mmol/L — ABNORMAL LOW (ref 135–145)
Total Bilirubin: 0.6 mg/dL (ref 0.3–1.2)
Total Protein: 7.9 g/dL (ref 6.5–8.1)

## 2021-01-21 LAB — MAGNESIUM: Magnesium: 2.1 mg/dL (ref 1.7–2.4)

## 2021-01-21 MED ORDER — SODIUM CHLORIDE 0.9 % IV SOLN: 1.2500 mg/kg | Freq: Once | INTRAVENOUS | Status: DC

## 2021-01-21 MED ORDER — SODIUM CHLORIDE 0.9 % IV SOLN
1.2400 mg/kg | Freq: Once | INTRAVENOUS | Status: AC
  Administered 2021-01-21: 80 mg via INTRAVENOUS
  Filled 2021-01-21: qty 8

## 2021-01-21 MED ORDER — SODIUM CHLORIDE 0.9 % IV SOLN: Freq: Once | INTRAVENOUS | Status: AC

## 2021-01-21 MED ORDER — DEXAMETHASONE SODIUM PHOSPHATE 100 MG/10ML IJ SOLN
10.0000 mg | Freq: Once | INTRAMUSCULAR | Status: AC
  Administered 2021-01-21: 10 mg via INTRAVENOUS
  Filled 2021-01-21: qty 10

## 2021-01-21 MED ORDER — SODIUM CHLORIDE 0.9 % IV SOLN
8.0000 mg | Freq: Once | INTRAVENOUS | Status: AC
  Administered 2021-01-21: 8 mg via INTRAVENOUS
  Filled 2021-01-21: qty 8

## 2021-01-21 NOTE — Progress Notes (Signed)
Patient presents today for chemotherapy infusion.  Patient is in satisfactory condition with no complaints voiced.  Vital signs are stable.  Labs reviewed and all are within treatment parameters. We will proceed with treatment per MD orders.   Patient tolerated treatment well with no complaints voiced.  Patient left ambulatory in stable condition.  Vital signs stable at discharge.  Follow up as scheduled.

## 2021-01-21 NOTE — Patient Instructions (Signed)
[facility]  Discharge Instructions: Thank you for choosing [facility] to provide your oncology and hematology care.  If you have a lab appointment with the Cancer Center, please come in thru the Main Entrance and check in at the main information desk.  Wear comfortable clothing and clothing appropriate for easy access to any Portacath or PICC line.   We strive to give you quality time with your provider. You may need to reschedule your appointment if you arrive late (15 or more minutes).  Arriving late affects you and other patients whose appointments are after yours.  Also, if you miss three or more appointments without notifying the office, you may be dismissed from the clinic at the provider's discretion.      For prescription refill requests, have your pharmacy contact our office and allow 72 hours for refills to be completed.        To help prevent nausea and vomiting after your treatment, we encourage you to take your nausea medication as directed.  BELOW ARE SYMPTOMS THAT SHOULD BE REPORTED IMMEDIATELY: *FEVER GREATER THAN 100.4 F (38 C) OR HIGHER *CHILLS OR SWEATING *NAUSEA AND VOMITING THAT IS NOT CONTROLLED WITH YOUR NAUSEA MEDICATION *UNUSUAL SHORTNESS OF BREATH *UNUSUAL BRUISING OR BLEEDING *URINARY PROBLEMS (pain or burning when urinating, or frequent urination) *BOWEL PROBLEMS (unusual diarrhea, constipation, pain near the anus) TENDERNESS IN MOUTH AND THROAT WITH OR WITHOUT PRESENCE OF ULCERS (sore throat, sores in mouth, or a toothache) UNUSUAL RASH, SWELLING OR PAIN  UNUSUAL VAGINAL DISCHARGE OR ITCHING   Items with * indicate a potential emergency and should be followed up as soon as possible or go to the Emergency Department if any problems should occur.  Please show the CHEMOTHERAPY ALERT CARD or IMMUNOTHERAPY ALERT CARD at check-in to the Emergency Department and triage nurse.  Should you have questions after your visit or need to cancel  or reschedule your appointment, please contact [facility] [phone]  and follow the prompts.  Office hours are 8:00 a.m. to 4:30 p.m. Monday - Friday. Please note that voicemails left after 4:00 p.m. may not be returned until the following business day.  We are closed weekends and major holidays. You have access to a nurse at all times for urgent questions. Please call the main number to the clinic [phone] and follow the prompts.  For any non-urgent questions, you may also contact your provider using MyChart. We now offer e-Visits for anyone 18 and older to request care online for non-urgent symptoms. For details visit [email or web address].   Also download the MyChart app! Go to the app store, search "MyChart", open the app, select [place], and log in with your MyChart username and password.  Due to Covid, a mask is required upon entering the hospital/clinic. If you do not have a mask, one will be given to you upon arrival. For doctor visits, patients may have 1 support person aged 18 or older with them. For treatment visits, patients cannot have anyone with them due to current Covid guidelines and our immunocompromised population.  

## 2021-01-28 ENCOUNTER — Inpatient Hospital Stay (HOSPITAL_COMMUNITY): Payer: Medicare Other

## 2021-01-28 ENCOUNTER — Other Ambulatory Visit (HOSPITAL_COMMUNITY): Payer: Self-pay

## 2021-01-28 ENCOUNTER — Other Ambulatory Visit: Payer: Self-pay

## 2021-01-28 VITALS — BP 128/79 | HR 70 | Temp 97.6°F | Resp 18

## 2021-01-28 DIAGNOSIS — Z79899 Other long term (current) drug therapy: Secondary | ICD-10-CM | POA: Diagnosis not present

## 2021-01-28 DIAGNOSIS — C7910 Secondary malignant neoplasm of unspecified urinary organs: Secondary | ICD-10-CM

## 2021-01-28 DIAGNOSIS — Z5112 Encounter for antineoplastic immunotherapy: Secondary | ICD-10-CM | POA: Diagnosis not present

## 2021-01-28 DIAGNOSIS — F17210 Nicotine dependence, cigarettes, uncomplicated: Secondary | ICD-10-CM | POA: Diagnosis not present

## 2021-01-28 DIAGNOSIS — D509 Iron deficiency anemia, unspecified: Secondary | ICD-10-CM | POA: Diagnosis not present

## 2021-01-28 DIAGNOSIS — C688 Malignant neoplasm of overlapping sites of urinary organs: Secondary | ICD-10-CM | POA: Diagnosis not present

## 2021-01-28 DIAGNOSIS — R1012 Left upper quadrant pain: Secondary | ICD-10-CM | POA: Diagnosis not present

## 2021-01-28 DIAGNOSIS — K5900 Constipation, unspecified: Secondary | ICD-10-CM | POA: Diagnosis not present

## 2021-01-28 LAB — COMPREHENSIVE METABOLIC PANEL
ALT: 36 U/L (ref 0–44)
AST: 29 U/L (ref 15–41)
Albumin: 3.2 g/dL — ABNORMAL LOW (ref 3.5–5.0)
Alkaline Phosphatase: 67 U/L (ref 38–126)
Anion gap: 6 (ref 5–15)
BUN: 13 mg/dL (ref 8–23)
CO2: 21 mmol/L — ABNORMAL LOW (ref 22–32)
Calcium: 8.9 mg/dL (ref 8.9–10.3)
Chloride: 107 mmol/L (ref 98–111)
Creatinine, Ser: 0.93 mg/dL (ref 0.61–1.24)
GFR, Estimated: >60 mL/min (ref >60)
Glucose, Bld: 122 mg/dL — ABNORMAL HIGH (ref 70–99)
Potassium: 3.7 mmol/L (ref 3.5–5.1)
Sodium: 134 mmol/L — ABNORMAL LOW (ref 135–145)
Total Bilirubin: 0.4 mg/dL (ref 0.3–1.2)
Total Protein: 7.2 g/dL (ref 6.5–8.1)

## 2021-01-28 LAB — CBC WITH DIFFERENTIAL/PLATELET
Abs Immature Granulocytes: 0.08 10*3/uL — ABNORMAL HIGH (ref 0.00–0.07)
Basophils Absolute: 0 10*3/uL (ref 0.0–0.1)
Basophils Relative: 1 %
Eosinophils Absolute: 0.1 10*3/uL (ref 0.0–0.5)
Eosinophils Relative: 1 %
HCT: 38.4 % — ABNORMAL LOW (ref 39.0–52.0)
Hemoglobin: 13 g/dL (ref 13.0–17.0)
Immature Granulocytes: 1 %
Lymphocytes Relative: 21 %
Lymphs Abs: 1.8 10*3/uL (ref 0.7–4.0)
MCH: 28.6 pg (ref 26.0–34.0)
MCHC: 33.9 g/dL (ref 30.0–36.0)
MCV: 84.6 fL (ref 80.0–100.0)
Monocytes Absolute: 0.9 10*3/uL (ref 0.1–1.0)
Monocytes Relative: 11 %
Neutro Abs: 5.4 10*3/uL (ref 1.7–7.7)
Neutrophils Relative %: 65 %
Platelets: 240 10*3/uL (ref 150–400)
RBC: 4.54 MIL/uL (ref 4.22–5.81)
RDW: 20.5 % — ABNORMAL HIGH (ref 11.5–15.5)
WBC: 8.3 10*3/uL (ref 4.0–10.5)
nRBC: 0 % (ref 0.0–0.2)

## 2021-01-28 LAB — MAGNESIUM: Magnesium: 1.9 mg/dL (ref 1.7–2.4)

## 2021-01-28 MED ORDER — SODIUM CHLORIDE 0.9 % IV SOLN
8.0000 mg | Freq: Once | INTRAVENOUS | Status: AC
  Administered 2021-01-28: 8 mg via INTRAVENOUS
  Filled 2021-01-28: qty 8

## 2021-01-28 MED ORDER — SODIUM CHLORIDE 0.9 % IV SOLN
1.2400 mg/kg | Freq: Once | INTRAVENOUS | Status: AC
  Administered 2021-01-28: 80 mg via INTRAVENOUS
  Filled 2021-01-28: qty 8

## 2021-01-28 MED ORDER — TADALAFIL 10 MG PO TABS: 10.0000 mg | ORAL_TABLET | Freq: Every day | ORAL | 3 refills | Status: DC | PRN

## 2021-01-28 MED ORDER — SODIUM CHLORIDE 0.9 % IV SOLN
10.0000 mg | Freq: Once | INTRAVENOUS | Status: AC
  Administered 2021-01-28: 10 mg via INTRAVENOUS
  Filled 2021-01-28: qty 10

## 2021-01-28 MED ORDER — SODIUM CHLORIDE 0.9 % IV SOLN: Freq: Once | INTRAVENOUS | Status: AC

## 2021-01-28 NOTE — Patient Instructions (Signed)
[facility]  Discharge Instructions: Thank you for choosing [facility] to provide your oncology and hematology care.  If you have a lab appointment with the Cancer Center, please come in thru the Main Entrance and check in at the main information desk.  Wear comfortable clothing and clothing appropriate for easy access to any Portacath or PICC line.   We strive to give you quality time with your provider. You may need to reschedule your appointment if you arrive late (15 or more minutes).  Arriving late affects you and other patients whose appointments are after yours.  Also, if you miss three or more appointments without notifying the office, you may be dismissed from the clinic at the provider's discretion.      For prescription refill requests, have your pharmacy contact our office and allow 72 hours for refills to be completed.        To help prevent nausea and vomiting after your treatment, we encourage you to take your nausea medication as directed.  BELOW ARE SYMPTOMS THAT SHOULD BE REPORTED IMMEDIATELY: *FEVER GREATER THAN 100.4 F (38 C) OR HIGHER *CHILLS OR SWEATING *NAUSEA AND VOMITING THAT IS NOT CONTROLLED WITH YOUR NAUSEA MEDICATION *UNUSUAL SHORTNESS OF BREATH *UNUSUAL BRUISING OR BLEEDING *URINARY PROBLEMS (pain or burning when urinating, or frequent urination) *BOWEL PROBLEMS (unusual diarrhea, constipation, pain near the anus) TENDERNESS IN MOUTH AND THROAT WITH OR WITHOUT PRESENCE OF ULCERS (sore throat, sores in mouth, or a toothache) UNUSUAL RASH, SWELLING OR PAIN  UNUSUAL VAGINAL DISCHARGE OR ITCHING   Items with * indicate a potential emergency and should be followed up as soon as possible or go to the Emergency Department if any problems should occur.  Please show the CHEMOTHERAPY ALERT CARD or IMMUNOTHERAPY ALERT CARD at check-in to the Emergency Department and triage nurse.  Should you have questions after your visit or need to cancel  or reschedule your appointment, please contact [facility] [phone]  and follow the prompts.  Office hours are 8:00 a.m. to 4:30 p.m. Monday - Friday. Please note that voicemails left after 4:00 p.m. may not be returned until the following business day.  We are closed weekends and major holidays. You have access to a nurse at all times for urgent questions. Please call the main number to the clinic [phone] and follow the prompts.  For any non-urgent questions, you may also contact your provider using MyChart. We now offer e-Visits for anyone 18 and older to request care online for non-urgent symptoms. For details visit [email or web address].   Also download the MyChart app! Go to the app store, search "MyChart", open the app, select [place], and log in with your MyChart username and password.  Due to Covid, a mask is required upon entering the hospital/clinic. If you do not have a mask, one will be given to you upon arrival. For doctor visits, patients may have 1 support person aged 18 or older with them. For treatment visits, patients cannot have anyone with them due to current Covid guidelines and our immunocompromised population.  

## 2021-01-28 NOTE — Progress Notes (Signed)
Patient presents today for chemotherapy infusion.  Patient is in satisfactory condition with no complaints voiced.  Vital signs are stable.  Labs reviewed and all are within treatment parameters.  We will proceed with treatment per MD orders.   Patient tolerated treatment well with no complaints voiced.  Patient left ambulatory in stable condition.  Vital signs stable at discharge.  Follow up as scheduled.

## 2021-02-05 ENCOUNTER — Other Ambulatory Visit: Payer: Self-pay

## 2021-02-05 ENCOUNTER — Inpatient Hospital Stay (HOSPITAL_COMMUNITY): Payer: Medicare Other | Attending: Hematology

## 2021-02-05 ENCOUNTER — Ambulatory Visit (HOSPITAL_COMMUNITY)
Admission: RE | Admit: 2021-02-05 | Discharge: 2021-02-05 | Disposition: A | Discharge status: Home / Self Care | Payer: Medicare Other | Source: Ambulatory Visit | Attending: Hematology | Admitting: Hematology

## 2021-02-05 DIAGNOSIS — F419 Anxiety disorder, unspecified: Secondary | ICD-10-CM | POA: Insufficient documentation

## 2021-02-05 DIAGNOSIS — C7900 Secondary malignant neoplasm of unspecified kidney and renal pelvis: Secondary | ICD-10-CM | POA: Insufficient documentation

## 2021-02-05 DIAGNOSIS — N200 Calculus of kidney: Secondary | ICD-10-CM | POA: Diagnosis not present

## 2021-02-05 DIAGNOSIS — C787 Secondary malignant neoplasm of liver and intrahepatic bile duct: Secondary | ICD-10-CM | POA: Insufficient documentation

## 2021-02-05 DIAGNOSIS — Z79899 Other long term (current) drug therapy: Secondary | ICD-10-CM | POA: Insufficient documentation

## 2021-02-05 DIAGNOSIS — C679 Malignant neoplasm of bladder, unspecified: Secondary | ICD-10-CM | POA: Diagnosis not present

## 2021-02-05 DIAGNOSIS — R918 Other nonspecific abnormal finding of lung field: Secondary | ICD-10-CM | POA: Diagnosis not present

## 2021-02-05 DIAGNOSIS — F17210 Nicotine dependence, cigarettes, uncomplicated: Secondary | ICD-10-CM | POA: Insufficient documentation

## 2021-02-05 DIAGNOSIS — M47814 Spondylosis without myelopathy or radiculopathy, thoracic region: Secondary | ICD-10-CM | POA: Diagnosis not present

## 2021-02-05 DIAGNOSIS — R590 Localized enlarged lymph nodes: Secondary | ICD-10-CM | POA: Insufficient documentation

## 2021-02-05 DIAGNOSIS — N2889 Other specified disorders of kidney and ureter: Secondary | ICD-10-CM | POA: Insufficient documentation

## 2021-02-05 DIAGNOSIS — C7910 Secondary malignant neoplasm of unspecified urinary organs: Secondary | ICD-10-CM | POA: Insufficient documentation

## 2021-02-05 DIAGNOSIS — R630 Anorexia: Secondary | ICD-10-CM | POA: Insufficient documentation

## 2021-02-05 DIAGNOSIS — R1012 Left upper quadrant pain: Secondary | ICD-10-CM | POA: Diagnosis not present

## 2021-02-05 DIAGNOSIS — I700 Atherosclerosis of aorta: Secondary | ICD-10-CM | POA: Insufficient documentation

## 2021-02-05 DIAGNOSIS — K5900 Constipation, unspecified: Secondary | ICD-10-CM | POA: Diagnosis not present

## 2021-02-05 DIAGNOSIS — D509 Iron deficiency anemia, unspecified: Secondary | ICD-10-CM | POA: Insufficient documentation

## 2021-02-05 DIAGNOSIS — M47816 Spondylosis without myelopathy or radiculopathy, lumbar region: Secondary | ICD-10-CM | POA: Diagnosis not present

## 2021-02-05 LAB — COMPREHENSIVE METABOLIC PANEL
ALT: 32 U/L (ref 0–44)
AST: 24 U/L (ref 15–41)
Albumin: 3.1 g/dL — ABNORMAL LOW (ref 3.5–5.0)
Alkaline Phosphatase: 60 U/L (ref 38–126)
Anion gap: 7 (ref 5–15)
BUN: 11 mg/dL (ref 8–23)
CO2: 22 mmol/L (ref 22–32)
Calcium: 8.9 mg/dL (ref 8.9–10.3)
Chloride: 107 mmol/L (ref 98–111)
Creatinine, Ser: 0.94 mg/dL (ref 0.61–1.24)
GFR, Estimated: >60 mL/min (ref >60)
Glucose, Bld: 158 mg/dL — ABNORMAL HIGH (ref 70–99)
Potassium: 3.8 mmol/L (ref 3.5–5.1)
Sodium: 136 mmol/L (ref 135–145)
Total Bilirubin: 0.6 mg/dL (ref 0.3–1.2)
Total Protein: 6.7 g/dL (ref 6.5–8.1)

## 2021-02-05 LAB — CBC WITH DIFFERENTIAL/PLATELET
Abs Immature Granulocytes: 0.04 10*3/uL (ref 0.00–0.07)
Basophils Absolute: 0 10*3/uL (ref 0.0–0.1)
Basophils Relative: 1 %
Eosinophils Absolute: 0.1 10*3/uL (ref 0.0–0.5)
Eosinophils Relative: 2 %
HCT: 37.2 % — ABNORMAL LOW (ref 39.0–52.0)
Hemoglobin: 12.8 g/dL — ABNORMAL LOW (ref 13.0–17.0)
Immature Granulocytes: 1 %
Lymphocytes Relative: 21 %
Lymphs Abs: 1.3 10*3/uL (ref 0.7–4.0)
MCH: 29.8 pg (ref 26.0–34.0)
MCHC: 34.4 g/dL (ref 30.0–36.0)
MCV: 86.7 fL (ref 80.0–100.0)
Monocytes Absolute: 0.8 10*3/uL (ref 0.1–1.0)
Monocytes Relative: 12 %
Neutro Abs: 4.1 10*3/uL (ref 1.7–7.7)
Neutrophils Relative %: 63 %
Platelets: 259 10*3/uL (ref 150–400)
RBC: 4.29 MIL/uL (ref 4.22–5.81)
RDW: 18.7 % — ABNORMAL HIGH (ref 11.5–15.5)
WBC: 6.3 10*3/uL (ref 4.0–10.5)
nRBC: 0 % (ref 0.0–0.2)

## 2021-02-05 LAB — MAGNESIUM: Magnesium: 2 mg/dL (ref 1.7–2.4)

## 2021-02-05 LAB — TSH: TSH: 1.417 u[IU]/mL (ref 0.350–4.500)

## 2021-02-05 MED ORDER — IOHEXOL 300 MG/ML  SOLN
100.0000 mL | Freq: Once | INTRAMUSCULAR | Status: AC | PRN
  Administered 2021-02-05: 100 mL via INTRAVENOUS

## 2021-02-06 ENCOUNTER — Other Ambulatory Visit (HOSPITAL_COMMUNITY): Payer: Self-pay

## 2021-02-06 ENCOUNTER — Encounter (HOSPITAL_COMMUNITY): Payer: Self-pay

## 2021-02-09 ENCOUNTER — Encounter (HOSPITAL_COMMUNITY): Payer: Self-pay | Admitting: Hematology

## 2021-02-09 ENCOUNTER — Other Ambulatory Visit (HOSPITAL_COMMUNITY): Payer: Self-pay | Admitting: Hematology

## 2021-02-09 MED ORDER — OXYCODONE HCL 20 MG PO TABS: 15.0000 mg | ORAL_TABLET | Freq: Three times a day (TID) | ORAL | 0 refills | Status: DC | PRN

## 2021-02-09 MED ORDER — OXYCODONE HCL 15 MG PO TABS: 15.0000 mg | ORAL_TABLET | Freq: Four times a day (QID) | ORAL | 0 refills | Status: DC | PRN

## 2021-02-09 NOTE — Telephone Encounter (Signed)
Patient aware. He will discuss further with Dr. Delton Coombes at appointment if this is not helping.

## 2021-02-10 ENCOUNTER — Ambulatory Visit (HOSPITAL_COMMUNITY): Payer: Medicare Other | Admitting: Hematology

## 2021-02-10 ENCOUNTER — Ambulatory Visit (HOSPITAL_COMMUNITY): Payer: Medicare Other

## 2021-02-10 NOTE — Progress Notes (Signed)
Jeremy Rodriguez, Jeremy Rodriguez [id number]   CLINIC:  Medical Oncology/Hematology  PCP:  Lindell Spar, MD [address] / [place] [address] [phone]   REASON FOR VISIT:  Follow-up for metastatic urothelial carcinoma  PRIOR THERAPY: Keytruda every 3 weeks  NGS Results: not done  CURRENT THERAPY: Enfortumab every 4 weeks  BRIEF ONCOLOGIC HISTORY:  Oncology History  Metastatic urothelial carcinoma (Jeremy Rodriguez)  08/13/2020 Initial Diagnosis   Metastatic urothelial carcinoma (Jeremy Rodriguez)   08/13/2020 Cancer Staging   Staging form: Urinary Bladder, AJCC 8th Edition - Clinical stage from 08/13/2020: Stage IVB (cTX, cN0, pM1b) - Signed by Derek Jack, MD on 08/13/2020 Stage prefix: Initial diagnosis WHO/ISUP grade (low/high): High Grade Histologic grading system: 2 grade system    08/20/2020 - 10/22/2020 Chemotherapy          11/19/2020 -  Chemotherapy   Patient is on Treatment Plan : UROTHELIAL LOCALLY ADVANCED / METASTATIC Enfortumab q28d       CANCER STAGING: Cancer Staging Metastatic urothelial carcinoma (Jeremy Rodriguez) Staging form: Urinary Bladder, AJCC 8th Edition - Clinical stage from 08/13/2020: Stage IVB (cTX, cN0, pM1b) - Signed by Derek Jack, MD on 08/13/2020   INTERVAL HISTORY:  [name], a 70 y.o. male, returns for routine follow-up and consideration for next cycle of chemotherapy. Daryn was last seen on 01/14/2021.  Due for cycle #4 of enfortumab today.   Overall, he tells me he has been feeling pretty well, and he is accompanied by his daughter. He reports new intermittent pain in RUQ abdomen. His left side abdominal pain has improved slightly. He is taking three 15 mg Oxycodone tablets a day. His appetite is good, and he denies fatigue. He reports SOB upon exertion. He reports pain occasionally will disrupt his sleep at which time he will take 2 ibuprofen tablets; he takes 4- 6 ibuprofen tablets daily prn.     Overall, he will not receive his next cycle of chemo today.   REVIEW OF SYSTEMS:  Review of Systems  Constitutional:  Negative for appetite change and fatigue.  Respiratory:  Positive for shortness of breath.   Gastrointestinal:  Positive for abdominal pain (RUQ) and constipation.  Genitourinary:  Positive for pelvic pain.   Neurological:  Positive for numbness.  Psychiatric/Behavioral:  Positive for sleep disturbance.   All other systems reviewed and are negative.  PAST MEDICAL/SURGICAL HISTORY:  Past Medical History:  Diagnosis Date   Chronic kidney disease    GERD (gastroesophageal reflux disease)    Hypertension    Medical history non-contributory    Past Surgical History:  Procedure Laterality Date   appendectomy      SOCIAL HISTORY:  Social History   Socioeconomic History   Marital status: Single    Spouse name: Not on file   Number of children: Not on file   Years of education: Not on file   Highest education level: Not on file  Occupational History   Not on file  Tobacco Use   Smoking status: Some Days   Smokeless tobacco: Never   Tobacco comments:    a few cigarettes a day  Substance and Sexual Activity   Alcohol use: Yes    Comment: occ   Drug use: Never   Sexual activity: Not on file  Other Topics Concern   Not on file  Social History Narrative   Not on file   Social Determinants of Health   Financial Resource Strain: Low Risk  Difficulty of Paying Living Expenses: Not hard at all  Food Insecurity: No Food Insecurity   Worried About [place] in the Last Year: Never true   Ran Out of Food in the Last Year: Never true  Transportation Needs: No Transportation Needs   Lack of Transportation (Medical): No   Lack of Transportation (Non-Medical): No  Physical Activity: Inactive   Days of Exercise per Week: 5 days   Minutes of Exercise per Session: 0 min  Stress: No Stress Concern Present   Feeling of Stress : Not at all  Social  Connections: Moderately Isolated   Frequency of Communication with Friends and Family: More than three times a week   Frequency of Social Gatherings with Friends and Family: More than three times a week   Attends Religious Services: More than 4 times per year   Active Member of [employer] or Organizations: No   Attends Music therapist: Never   Marital Status: Divorced  Human resources officer Violence: Not At Risk   Fear of Current or Ex-Partner: No   Emotionally Abused: No   Physically Abused: No   Sexually Abused: No    FAMILY HISTORY:  Family History  Problem Relation Age of Onset   Colon cancer Neg Hx    Colon polyps Neg Hx    Liver cancer Neg Hx    Pancreatic cancer Neg Hx     CURRENT MEDICATIONS:  Current Outpatient Medications  Medication Sig Dispense Refill   dicyclomine (BENTYL) 10 MG capsule Take 1 capsule (10 mg total) by mouth 3 (three) times daily as needed for spasms. (Patient not taking: Reported on 01/14/2021) 30 capsule 3   docusate sodium (COLACE) 100 MG capsule Take 1 capsule (100 mg total) by mouth daily as needed for mild constipation. 30 capsule 0   Lactulose 20 GM/30ML SOLN Take 30 mLs (20 g total) by mouth daily. 473 mL 6   megestrol (MEGACE) 400 MG/10ML suspension Take 10 mLs (400 mg total) by mouth daily. 480 mL 3   naloxegol oxalate (MOVANTIK) 25 MG TABS tablet Take 1 tablet (25 mg total) by mouth daily. 30 tablet 3   oxyCODONE 20 MG TABS Take 0.75 tablets (15 mg total) by mouth every 8 (eight) hours as needed for pain. 84 tablet 0   pantoprazole (PROTONIX) 40 MG tablet Take 1 tablet (40 mg total) by mouth daily. 30 tablet 6   tadalafil (CIALIS) 10 MG tablet Take 1 tablet (10 mg total) by mouth daily as needed for erectile dysfunction. 30 tablet 3   No current facility-administered medications for this visit.    ALLERGIES:  No Known Allergies  PHYSICAL EXAM:  Performance status (ECOG): 1 - Symptomatic but completely ambulatory  There were no  vitals filed for this visit. Wt Readings from Last 3 Encounters:  01/21/21 144 lb 12.8 oz (65.7 kg)  01/14/21 143 lb 4.8 oz (65 kg)  12/31/20 147 lb (66.7 kg)   Physical Exam Vitals reviewed.  Constitutional:      Appearance: Normal appearance.  Cardiovascular:     Rate and Rhythm: Normal rate and regular rhythm.     Pulses: Normal pulses.     Heart sounds: Normal heart sounds.  Pulmonary:     Effort: Pulmonary effort is normal.     Breath sounds: Normal breath sounds.  Neurological:     General: No focal deficit present.     Mental Status: He is alert and oriented to person, place, and time.  Psychiatric:  Mood and Affect: Mood normal.        Behavior: Behavior normal.    LABORATORY DATA:  I have reviewed the labs as listed.  CBC Latest Ref Rng & Units 02/05/2021 01/28/2021 01/21/2021  WBC 4.0 - 10.5 K/uL 6.3 8.3 8.4  Hemoglobin 13.0 - 17.0 g/dL 12.8(L) 13.0 14.3  Hematocrit 39.0 - 52.0 % 37.2(L) 38.4(L) 42.4  Platelets 150 - 400 K/uL 259 240 313   CMP Latest Ref Rng & Units 02/05/2021 01/28/2021 01/21/2021  Glucose 70 - 99 mg/dL 158(H) 122(H) 171(H)  BUN 8 - 23 mg/dL _0 Creatinine 0.61 - 1.24 mg/dL 0.94 0.93 1.03  Sodium 135 - 145 mmol/L 136 134(L) 133(L)  Potassium 3.5 - 5.1 mmol/L 3.8 3.7 3.9  Chloride 98 - 111 mmol/L 107 107 104  CO2 22 - 32 mmol/L 22 21(L) 22  Calcium 8.9 - 10.3 mg/dL 8.9 8.9 9.5  Total Protein 6.5 - 8.1 g/dL 6.7 7.2 7.9  Total Bilirubin 0.3 - 1.2 mg/dL 0.6 0.4 0.6  Alkaline Phos 38 - 126 U/L 60 67 67  AST 15 - 41 U/L _1 ALT 0 - 44 U/L 32 36 32    DIAGNOSTIC IMAGING:  I have independently reviewed the scans and discussed with the patient. CT CHEST ABDOMEN PELVIS W CONTRAST  Result Date: 02/07/2021 CLINICAL DATA:  Metastatic urothelial cancer diagnosed May 2022, status post chemotherapy. Restaging. EXAM: CT CHEST, ABDOMEN, AND PELVIS WITH CONTRAST TECHNIQUE: Multidetector CT imaging of the chest, abdomen and pelvis was  performed following the standard protocol during bolus administration of intravenous contrast. CONTRAST:  159m OMNIPAQUE IOHEXOL 300 MG/ML  SOLN COMPARISON:  11/11/2020 CT chest, abdomen and pelvis. FINDINGS: CT CHEST FINDINGS Cardiovascular: Normal heart size. No significant pericardial effusion/thickening. Mildly atherosclerotic nonaneurysmal thoracic aorta. Normal caliber pulmonary arteries. No central pulmonary emboli. Mediastinum/Nodes: No discrete thyroid nodules. Unremarkable esophagus. No axillary adenopathy. Newly mildly enlarged 1.0 cm short axis diameter left hilar node (series 2/image 30). No additional pathologically enlarged mediastinal or hilar nodes. Lungs/Pleura: No pneumothorax. No pleural effusion. Mild-to-moderate patchy centrilobular ground-glass opacities in the superior segment right lower lobe and medial right lower lobe, new. No acute consolidative airspace disease, lung masses or significant pulmonary nodules. Musculoskeletal: No aggressive appearing focal osseous lesions. Healed lateral left mid rib deformities. Mild-to-moderate thoracic spondylosis. CT ABDOMEN PELVIS FINDINGS Hepatobiliary: Numerous (greater than 10) liver masses scattered throughout the liver with targetoid enhancement, increased in size and number. Representative 3.9 x 3.7 cm segment 8 right liver mass (series 2/image 53), increased from 1.8 x 1.7 cm using similar measurement technique. Segment 4A left liver 2.9 x 2.7 cm mass (series 2/image 58), new. Inferior right liver 2.2 x 2.1 cm mass (series 2/image 74), increased from 1.3 x 1.2 cm. Normal gallbladder with no radiopaque cholelithiasis. No biliary ductal dilatation. Pancreas: Normal, with no mass or duct dilation. Spleen: Normal size. No mass. Adrenals/Urinary Tract: Mildly lobulated right adrenal gland without discrete right adrenal nodules, unchanged. Infiltrative heterogeneous solid 6.8 x 4.8 cm upper left renal mass replacing much of the left renal sinus and  encasing the left adrenal gland and invading the left diaphragmatic crus (series 2/image 58), previously 7.6 x 6.1 cm, mildly decreased. Nonobstructing 2 mm lower left renal stone. No right renal masses. No right hydronephrosis. Chronic mild left caliectasis. Normal nondistended bladder. Stomach/Bowel: Normal non-distended stomach. Normal caliber small bowel with no small bowel wall thickening. Appendectomy. Oral contrast reaches the right colon. Normal large bowel with  no diverticulosis, large bowel wall thickening or pericolonic fat stranding. Vascular/Lymphatic: Atherosclerotic nonaneurysmal abdominal aorta. Patent hepatic, portal, splenic and renal veins with stable partial encasement of the left renal vein by the upper left renal mass. No pathologically enlarged lymph nodes in the abdomen or pelvis. Reproductive: Normal size prostate with nonspecific coarse right prostatic calcifications. Other: No pneumoperitoneum, ascites or focal fluid collection. Musculoskeletal: No aggressive appearing focal osseous lesions. Moderate lumbar spondylosis. IMPRESSION: 1. Interval progression of numerous liver metastases. 2. Infiltrative upper left renal mass is mildly decreased in size. 3. New mild left hilar lymphadenopathy, suspicious for nodal metastasis. 4. New mild-to-moderate patchy centrilobular ground-glass opacities in the right greater than left lower lobes, favor inflammatory etiology, with the differential including aspiration. 5. Chronic findings include: Nonobstructing left nephrolithiasis. Aortic Atherosclerosis (ICD10-I70.0). Electronically Signed   By: Ilona Sorrel M.D.   On: 02/07/2021 12:19     ASSESSMENT:  1. 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 - 20 pound weight loss in the last 4 months due to decreased appetite.  Left upper quadrant pain for the past 2 to 3 months. - CTAP with  contrast on 07/10/2020 showed ill-defined low-density mass in the right hepatic lobe measuring 2 x 2 cm.  Left upper pole kidney mass measuring 7.6 x 5.9 cm with encasement and narrowing of the left renal artery and vein.  No intravascular thrombus noted.  Mass encases the left adrenal gland.  Small right adrenal nodule measuring 11 mm, likely benign.  No other evidence of metastatic disease. - MRI of the liver with and without contrast on 07/20/2020 shows 2.2 x 1.7 cm enhancing lesion in the medial aspect of the segment 7, suspicious for metastasis.  Left renal mass, 6 x 7.6 x 6.9 cm infiltrating enhancing left upper pole renal mass, extending into the left renal sinus.  Possible TCC or lymphoma rather than renal cell carcinoma. - CT chest on 07/20/2020 with no evidence of metastatic disease. - PET scan on 08/04/2020 showed large hypermetabolic left upper pole mass and 2 hypermetabolic lesions in the right hepatic lobe favoring metastatic disease.  No hypermetabolic adenopathy.  - Liver biopsy showed poorly differentiated metastatic urothelial carcinoma.  IHC positive for CK5/6, CK7, GATA3, p40 and patchy positivity with p63 and PAX8. - He refused any chemotherapy. - 4 cycles of pembrolizumab from 08/20/2020 through 10/22/2020 with progression. - NGS testing showed benefit from pembrolizumab.  No mutations involving FGFR, NTRK was found.  Test was limited due to small sample. - CT CAP on 11/11/2020 showed interval progression of liver metastasis.  No significant change infiltrative mass arising in the upper pole of the left kidney invading the medial aspect of the upper spleen, left renal vein and left adrenal gland with soft tissue infiltration to the left retroperitoneal fat.  No evidence of metastatic disease to the chest. - Enfortumab vedotin started on 11/19/2020   2.  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 - Reports exposure to carbon black dust.   Smoked half pack per day for 30 years, currently smoking 2 to 3 cigarettes/day. - No family history of malignancy.   PLAN:  1. Metastatic poorly differentiated urothelial carcinoma arising in the renal pelvis: -He has completed 3 cycles of enfortumab vedotin. - Reviewed CT CAP from 02/05/2021.  This showed decrease in the left renal mass by 1 cm.  However interval progression of numerous liver metastasis. - Recommend change in therapy at this time. - Will obtain guardant 360 as his previous NGS testing was somewhat limited. - We talked about third line therapy with sacituzumab govitecan on days 1 and 8 every 21 days.  We discussed common side effects including cytopenias, fatigue, GI symptoms, electrolyte abnormalities among others. - Will likely start him on 02/16/2021. - RTC cycle 2-day 1 with labs and treatment.     2.  Left upper quadrant pain: -He reports improvement in the left upper quadrant pain.  However he has developed right upper quadrant pain for the last 2 to 3 days. - Will increase oxycodone to 20 mg every 6 hours as needed. - He is also taking ibuprofen 800 mg along with oxycodone which is helping.  We have sent a prescription for ibuprofen 800 mg 1-2 times daily.  3.  Constipation: -Continue stool softener which is helping.  4.  Normocytic to microcytic anemia: -Status post Feraheme on 11/19/2020.  Hemoglobin 12.8.  5.  Loss of appetite: - Continue Megace 400 mg twice daily. - His appetite is better.  Continue Megace 400 mg twice daily. - He is continuing to gain weight.  6.  Anxiety: - We will start him on Xanax 0.5 mg twice daily as needed.   Orders placed this encounter:  No orders of the defined types were placed in this encounter.    Derek Jack, MD [place] [phone]   I, Thana Ates, am acting as a scribe for Dr. Derek Jack.  I, Derek Jack MD, have reviewed the above documentation for accuracy and  completeness, and I agree with the above.

## 2021-02-11 ENCOUNTER — Inpatient Hospital Stay (HOSPITAL_COMMUNITY): Payer: Medicare Other

## 2021-02-11 ENCOUNTER — Encounter (HOSPITAL_COMMUNITY): Payer: Self-pay | Admitting: Hematology

## 2021-02-11 ENCOUNTER — Inpatient Hospital Stay (HOSPITAL_BASED_OUTPATIENT_CLINIC_OR_DEPARTMENT_OTHER): Payer: Medicare Other | Admitting: Hematology

## 2021-02-11 ENCOUNTER — Other Ambulatory Visit: Payer: Self-pay

## 2021-02-11 VITALS — BP 142/74 | HR 79 | Temp 97.0°F | Resp 19 | Wt 147.6 lb

## 2021-02-11 DIAGNOSIS — K5900 Constipation, unspecified: Secondary | ICD-10-CM | POA: Diagnosis not present

## 2021-02-11 DIAGNOSIS — D509 Iron deficiency anemia, unspecified: Secondary | ICD-10-CM | POA: Diagnosis not present

## 2021-02-11 DIAGNOSIS — K219 Gastro-esophageal reflux disease without esophagitis: Secondary | ICD-10-CM

## 2021-02-11 DIAGNOSIS — C787 Secondary malignant neoplasm of liver and intrahepatic bile duct: Secondary | ICD-10-CM | POA: Diagnosis not present

## 2021-02-11 DIAGNOSIS — F17210 Nicotine dependence, cigarettes, uncomplicated: Secondary | ICD-10-CM | POA: Diagnosis not present

## 2021-02-11 DIAGNOSIS — C7900 Secondary malignant neoplasm of unspecified kidney and renal pelvis: Secondary | ICD-10-CM | POA: Diagnosis not present

## 2021-02-11 DIAGNOSIS — C7910 Secondary malignant neoplasm of unspecified urinary organs: Secondary | ICD-10-CM | POA: Diagnosis not present

## 2021-02-11 DIAGNOSIS — C652 Malignant neoplasm of left renal pelvis: Secondary | ICD-10-CM

## 2021-02-11 DIAGNOSIS — R1012 Left upper quadrant pain: Secondary | ICD-10-CM | POA: Diagnosis not present

## 2021-02-11 DIAGNOSIS — Z79899 Other long term (current) drug therapy: Secondary | ICD-10-CM | POA: Diagnosis not present

## 2021-02-11 MED ORDER — ALPRAZOLAM 0.5 MG PO TABS: 0.5000 mg | ORAL_TABLET | Freq: Two times a day (BID) | ORAL | 0 refills | Status: DC | PRN

## 2021-02-11 MED ORDER — IBUPROFEN 800 MG PO TABS: 800.0000 mg | ORAL_TABLET | Freq: Two times a day (BID) | ORAL | 0 refills | Status: DC | PRN

## 2021-02-11 MED ORDER — PROCHLORPERAZINE MALEATE 10 MG PO TABS: 10.0000 mg | ORAL_TABLET | Freq: Four times a day (QID) | ORAL | 3 refills | Status: DC | PRN

## 2021-02-11 NOTE — Patient Instructions (Addendum)
[place] at [facility] Discharge Instructions   You were seen and examined today by Dr. Delton Coombes.   He reviewed the results of your CT scan which showed progression of this cancer in your liver.   We will change your treatment to a drug called sacituzumab Ivette Loyal) - this is a targeted therapy with just a small amount of chemotherapy that is attached to it.  The most common side effects of this drug are fatigue, low blood counts, and nausea/vomiting/diarrhea/constipation.  We will do a special blood test called Guardant 360 to check the cancer cells in your blood.   We will repeat scans after 2-3 months after being on this medication.   Return as scheduled for lab work, office visit, and treatment.     Thank you for choosing [place] at [facility] to provide your oncology and hematology care.  To afford each patient quality time with our provider, please arrive at least 15 minutes before your scheduled appointment time.   If you have a lab appointment with the [facility] please come in thru the Main Entrance and check in at the main information desk.  You need to re-schedule your appointment should you arrive 10 or more minutes late.  We strive to give you quality time with our providers, and arriving late affects you and other patients whose appointments are after yours.  Also, if you no show three or more times for appointments you may be dismissed from the clinic at the providers discretion.     Again, thank you for choosing [facility].  Our hope is that these requests will decrease the amount of time that you wait before being seen by our physicians.       _____________________________________________________________  Should you have questions after your visit to [facility], please contact our office at [phone] and follow the prompts.  Our office hours are 8:00 a.m. and 4:30 p.m. Monday -  Friday.  Please note that voicemails left after 4:00 p.m. may not be returned until the following business day.  We are closed weekends and major holidays.  You do have access to a nurse 24-7, just call the main number to the clinic [phone] and do not press any options, hold on the line and a nurse will answer the phone.    For prescription refill requests, have your pharmacy contact our office and allow 72 hours.    Due to Covid, you will need to wear a mask upon entering the hospital. If you do not have a mask, a mask will be given to you at the Main Entrance upon arrival. For doctor visits, patients may have 1 support person age 52 or older with them. For treatment visits, patients can not have anyone with them due to social distancing guidelines and our immunocompromised population.

## 2021-02-11 NOTE — Progress Notes (Signed)
No treatment today per orders from Dr. Delton Coombes.

## 2021-02-11 NOTE — Progress Notes (Signed)
DISCONTINUE ON PATHWAY REGIMEN - Bladder     A cycle is every 28 days:     Enfortumab vedotin-ejfv   **Always confirm dose/schedule in your pharmacy ordering system**  REASON: Disease Progression PRIOR TREATMENT: [id number]: Enfortumab vedotin 1.25 mg/kg D1, 8, 15 q28 Days Until Progression or Unacceptable Toxicity TREATMENT RESPONSE: Progressive Disease (PD)  START ON PATHWAY REGIMEN - Bladder     A cycle is every 21 days:     Sacituzumab govitecan-hziy   **Always confirm dose/schedule in your pharmacy ordering system**  Patient Characteristics: Advanced/Metastatic Disease, Third Line and Beyond Therapeutic Status: Advanced/Metastatic Disease Line of Therapy: Third Line and Beyond  Intent of Therapy: Non-Curative / Palliative Intent, Discussed with Patient

## 2021-02-12 ENCOUNTER — Encounter (HOSPITAL_COMMUNITY): Payer: Self-pay | Admitting: Hematology

## 2021-02-12 NOTE — Addendum Note (Signed)
Addended by: Joie Bimler on: 02/12/2021 09:41 AM   Modules accepted: Orders

## 2021-02-15 ENCOUNTER — Other Ambulatory Visit: Payer: Self-pay

## 2021-02-15 ENCOUNTER — Inpatient Hospital Stay (HOSPITAL_COMMUNITY): Payer: Medicare Other

## 2021-02-15 VITALS — BP 117/75 | HR 76 | Temp 98.1°F | Resp 18 | Wt 150.4 lb

## 2021-02-15 DIAGNOSIS — C7900 Secondary malignant neoplasm of unspecified kidney and renal pelvis: Secondary | ICD-10-CM | POA: Diagnosis not present

## 2021-02-15 DIAGNOSIS — D509 Iron deficiency anemia, unspecified: Secondary | ICD-10-CM | POA: Diagnosis not present

## 2021-02-15 DIAGNOSIS — C787 Secondary malignant neoplasm of liver and intrahepatic bile duct: Secondary | ICD-10-CM | POA: Diagnosis not present

## 2021-02-15 DIAGNOSIS — Z79899 Other long term (current) drug therapy: Secondary | ICD-10-CM | POA: Diagnosis not present

## 2021-02-15 DIAGNOSIS — F17210 Nicotine dependence, cigarettes, uncomplicated: Secondary | ICD-10-CM | POA: Diagnosis not present

## 2021-02-15 DIAGNOSIS — K5900 Constipation, unspecified: Secondary | ICD-10-CM | POA: Diagnosis not present

## 2021-02-15 DIAGNOSIS — C7910 Secondary malignant neoplasm of unspecified urinary organs: Secondary | ICD-10-CM

## 2021-02-15 DIAGNOSIS — R1012 Left upper quadrant pain: Secondary | ICD-10-CM | POA: Diagnosis not present

## 2021-02-15 LAB — PHOSPHORUS: Phosphorus: 3.4 mg/dL (ref 2.5–4.6)

## 2021-02-15 LAB — COMPREHENSIVE METABOLIC PANEL
ALT: 27 U/L (ref 0–44)
AST: 26 U/L (ref 15–41)
Albumin: 3.3 g/dL — ABNORMAL LOW (ref 3.5–5.0)
Alkaline Phosphatase: 70 U/L (ref 38–126)
Anion gap: 7 (ref 5–15)
BUN: 13 mg/dL (ref 8–23)
CO2: 23 mmol/L (ref 22–32)
Calcium: 9.1 mg/dL (ref 8.9–10.3)
Chloride: 105 mmol/L (ref 98–111)
Creatinine, Ser: 1.12 mg/dL (ref 0.61–1.24)
GFR, Estimated: >60 mL/min (ref >60)
Glucose, Bld: 139 mg/dL — ABNORMAL HIGH (ref 70–99)
Potassium: 4.2 mmol/L (ref 3.5–5.1)
Sodium: 135 mmol/L (ref 135–145)
Total Bilirubin: 0.4 mg/dL (ref 0.3–1.2)
Total Protein: 7.5 g/dL (ref 6.5–8.1)

## 2021-02-15 LAB — CBC WITH DIFFERENTIAL/PLATELET
Abs Immature Granulocytes: 0.02 10*3/uL (ref 0.00–0.07)
Basophils Absolute: 0 10*3/uL (ref 0.0–0.1)
Basophils Relative: 1 %
Eosinophils Absolute: 0.1 10*3/uL (ref 0.0–0.5)
Eosinophils Relative: 1 %
HCT: 37.7 % — ABNORMAL LOW (ref 39.0–52.0)
Hemoglobin: 12.8 g/dL — ABNORMAL LOW (ref 13.0–17.0)
Immature Granulocytes: 0 %
Lymphocytes Relative: 24 %
Lymphs Abs: 1.6 10*3/uL (ref 0.7–4.0)
MCH: 29.6 pg (ref 26.0–34.0)
MCHC: 34 g/dL (ref 30.0–36.0)
MCV: 87.1 fL (ref 80.0–100.0)
Monocytes Absolute: 0.8 10*3/uL (ref 0.1–1.0)
Monocytes Relative: 13 %
Neutro Abs: 4.1 10*3/uL (ref 1.7–7.7)
Neutrophils Relative %: 61 %
Platelets: 319 10*3/uL (ref 150–400)
RBC: 4.33 MIL/uL (ref 4.22–5.81)
RDW: 17.1 % — ABNORMAL HIGH (ref 11.5–15.5)
WBC: 6.7 10*3/uL (ref 4.0–10.5)
nRBC: 0 % (ref 0.0–0.2)

## 2021-02-15 LAB — MAGNESIUM: Magnesium: 2.2 mg/dL (ref 1.7–2.4)

## 2021-02-15 MED ORDER — SODIUM CHLORIDE 0.9 % IV SOLN
150.0000 mg | Freq: Once | INTRAVENOUS | Status: AC
  Administered 2021-02-15: 150 mg via INTRAVENOUS
  Filled 2021-02-15: qty 150

## 2021-02-15 MED ORDER — SODIUM CHLORIDE 0.9 % IV SOLN
10.0000 mg/kg | Freq: Once | INTRAVENOUS | Status: AC
  Administered 2021-02-15: 670 mg via INTRAVENOUS
  Filled 2021-02-15: qty 67

## 2021-02-15 MED ORDER — DIPHENHYDRAMINE HCL 50 MG/ML IJ SOLN
50.0000 mg | Freq: Once | INTRAMUSCULAR | Status: AC
  Administered 2021-02-15: 50 mg via INTRAVENOUS
  Filled 2021-02-15: qty 1

## 2021-02-15 MED ORDER — PALONOSETRON HCL INJECTION 0.25 MG/5ML
0.2500 mg | Freq: Once | INTRAVENOUS | Status: AC
  Administered 2021-02-15: 0.25 mg via INTRAVENOUS
  Filled 2021-02-15: qty 5

## 2021-02-15 MED ORDER — ATROPINE SULFATE 1 MG/ML IV SOLN
0.5000 mg | Freq: Once | INTRAVENOUS | Status: AC | PRN
  Administered 2021-02-15: 0.5 mg via INTRAVENOUS
  Filled 2021-02-15: qty 1

## 2021-02-15 MED ORDER — SODIUM CHLORIDE 0.9 % IV SOLN
10.0000 mg | Freq: Once | INTRAVENOUS | Status: AC
  Administered 2021-02-15: 10 mg via INTRAVENOUS
  Filled 2021-02-15: qty 10

## 2021-02-15 MED ORDER — ACETAMINOPHEN 325 MG PO TABS
650.0000 mg | ORAL_TABLET | Freq: Once | ORAL | Status: AC
  Administered 2021-02-15: 650 mg via ORAL
  Filled 2021-02-15: qty 2

## 2021-02-15 MED ORDER — FAMOTIDINE 20 MG IN NS 100 ML IVPB
20.0000 mg | Freq: Once | INTRAVENOUS | Status: AC
  Administered 2021-02-15: 20 mg via INTRAVENOUS
  Filled 2021-02-15: qty 20

## 2021-02-15 MED ORDER — SODIUM CHLORIDE 0.9 % IV SOLN: Freq: Once | INTRAVENOUS | Status: AC

## 2021-02-15 NOTE — Progress Notes (Signed)
Pt presents today for Trodelvy per provider's order. Pt voiced no new complaints at this time. Vitals and labs WNL for treatment. Okay for treatment today.  Marcia Brash today per MD orders. Tolerated infusion without adverse affects. Vital signs stable. No complaints at this time. Discharged from clinic ambulatory in stable condition. Alert and oriented x 3. F/U with [facility] as scheduled.

## 2021-02-18 ENCOUNTER — Other Ambulatory Visit (HOSPITAL_COMMUNITY): Payer: Self-pay | Admitting: Physician Assistant

## 2021-02-18 DIAGNOSIS — N529 Male erectile dysfunction, unspecified: Secondary | ICD-10-CM

## 2021-02-18 MED ORDER — TADALAFIL 10 MG PO TABS: 20.0000 mg | ORAL_TABLET | Freq: Every day | ORAL | 3 refills | Status: DC | PRN

## 2021-02-18 NOTE — Telephone Encounter (Signed)
The Tadalafil is the one that has not been addressed.

## 2021-02-22 ENCOUNTER — Inpatient Hospital Stay (HOSPITAL_COMMUNITY): Payer: Medicare Other

## 2021-02-22 ENCOUNTER — Other Ambulatory Visit: Payer: Self-pay

## 2021-02-22 VITALS — BP 105/66 | HR 67 | Temp 97.9°F | Resp 18 | Wt 146.2 lb

## 2021-02-22 DIAGNOSIS — C7900 Secondary malignant neoplasm of unspecified kidney and renal pelvis: Secondary | ICD-10-CM | POA: Diagnosis not present

## 2021-02-22 DIAGNOSIS — C7910 Secondary malignant neoplasm of unspecified urinary organs: Secondary | ICD-10-CM

## 2021-02-22 DIAGNOSIS — Z79899 Other long term (current) drug therapy: Secondary | ICD-10-CM | POA: Diagnosis not present

## 2021-02-22 DIAGNOSIS — R1012 Left upper quadrant pain: Secondary | ICD-10-CM | POA: Diagnosis not present

## 2021-02-22 DIAGNOSIS — D509 Iron deficiency anemia, unspecified: Secondary | ICD-10-CM | POA: Diagnosis not present

## 2021-02-22 DIAGNOSIS — K5900 Constipation, unspecified: Secondary | ICD-10-CM | POA: Diagnosis not present

## 2021-02-22 DIAGNOSIS — F17210 Nicotine dependence, cigarettes, uncomplicated: Secondary | ICD-10-CM | POA: Diagnosis not present

## 2021-02-22 DIAGNOSIS — C787 Secondary malignant neoplasm of liver and intrahepatic bile duct: Secondary | ICD-10-CM | POA: Diagnosis not present

## 2021-02-22 LAB — COMPREHENSIVE METABOLIC PANEL
ALT: 25 U/L (ref 0–44)
AST: 20 U/L (ref 15–41)
Albumin: 3.4 g/dL — ABNORMAL LOW (ref 3.5–5.0)
Alkaline Phosphatase: 93 U/L (ref 38–126)
Anion gap: 8 (ref 5–15)
BUN: 21 mg/dL (ref 8–23)
CO2: 21 mmol/L — ABNORMAL LOW (ref 22–32)
Calcium: 8.9 mg/dL (ref 8.9–10.3)
Chloride: 102 mmol/L (ref 98–111)
Creatinine, Ser: 1.11 mg/dL (ref 0.61–1.24)
GFR, Estimated: >60 mL/min (ref >60)
Glucose, Bld: 135 mg/dL — ABNORMAL HIGH (ref 70–99)
Potassium: 4 mmol/L (ref 3.5–5.1)
Sodium: 131 mmol/L — ABNORMAL LOW (ref 135–145)
Total Bilirubin: 1 mg/dL (ref 0.3–1.2)
Total Protein: 7.3 g/dL (ref 6.5–8.1)

## 2021-02-22 LAB — CBC WITH DIFFERENTIAL/PLATELET
Abs Immature Granulocytes: 0.05 10*3/uL (ref 0.00–0.07)
Basophils Absolute: 0 10*3/uL (ref 0.0–0.1)
Basophils Relative: 1 %
Eosinophils Absolute: 0.1 10*3/uL (ref 0.0–0.5)
Eosinophils Relative: 2 %
HCT: 35.9 % — ABNORMAL LOW (ref 39.0–52.0)
Hemoglobin: 12.5 g/dL — ABNORMAL LOW (ref 13.0–17.0)
Immature Granulocytes: 1 %
Lymphocytes Relative: 27 %
Lymphs Abs: 1.4 10*3/uL (ref 0.7–4.0)
MCH: 30.3 pg (ref 26.0–34.0)
MCHC: 34.8 g/dL (ref 30.0–36.0)
MCV: 86.9 fL (ref 80.0–100.0)
Monocytes Absolute: 0.9 10*3/uL (ref 0.1–1.0)
Monocytes Relative: 17 %
Neutro Abs: 2.7 10*3/uL (ref 1.7–7.7)
Neutrophils Relative %: 52 %
Platelets: 371 10*3/uL (ref 150–400)
RBC: 4.13 MIL/uL — ABNORMAL LOW (ref 4.22–5.81)
RDW: 15.1 % (ref 11.5–15.5)
WBC: 5.2 10*3/uL (ref 4.0–10.5)
nRBC: 0 % (ref 0.0–0.2)

## 2021-02-22 LAB — PHOSPHORUS: Phosphorus: 3.3 mg/dL (ref 2.5–4.6)

## 2021-02-22 LAB — MAGNESIUM: Magnesium: 2 mg/dL (ref 1.7–2.4)

## 2021-02-22 MED ORDER — FAMOTIDINE IN NACL 20-0.9 MG/50ML-% IV SOLN
20.0000 mg | Freq: Once | INTRAVENOUS | Status: AC
  Administered 2021-02-22: 20 mg via INTRAVENOUS
  Filled 2021-02-22: qty 50

## 2021-02-22 MED ORDER — SODIUM CHLORIDE 0.9 % IV SOLN
Freq: Once | INTRAVENOUS | Status: AC
Start: 2021-02-22 — End: 2021-02-22

## 2021-02-22 MED ORDER — ACETAMINOPHEN 325 MG PO TABS
650.0000 mg | ORAL_TABLET | Freq: Once | ORAL | Status: AC
  Administered 2021-02-22: 650 mg via ORAL
  Filled 2021-02-22: qty 2

## 2021-02-22 MED ORDER — PALONOSETRON HCL INJECTION 0.25 MG/5ML
0.2500 mg | Freq: Once | INTRAVENOUS | Status: AC
  Administered 2021-02-22: 0.25 mg via INTRAVENOUS
  Filled 2021-02-22: qty 5

## 2021-02-22 MED ORDER — SODIUM CHLORIDE 0.9 % IV SOLN
10.0000 mg/kg | Freq: Once | INTRAVENOUS | Status: AC
  Administered 2021-02-22: 670 mg via INTRAVENOUS
  Filled 2021-02-22: qty 67

## 2021-02-22 MED ORDER — FAMOTIDINE 20 MG IN NS 100 ML IVPB: 20.0000 mg | Freq: Once | INTRAVENOUS | Status: DC

## 2021-02-22 MED ORDER — SODIUM CHLORIDE 0.9 % IV SOLN
150.0000 mg | Freq: Once | INTRAVENOUS | Status: AC
  Administered 2021-02-22: 150 mg via INTRAVENOUS
  Filled 2021-02-22: qty 5

## 2021-02-22 MED ORDER — DIPHENHYDRAMINE HCL 50 MG/ML IJ SOLN
50.0000 mg | Freq: Once | INTRAMUSCULAR | Status: AC
  Administered 2021-02-22: 50 mg via INTRAVENOUS
  Filled 2021-02-22: qty 1

## 2021-02-22 MED ORDER — SODIUM CHLORIDE 0.9 % IV SOLN
10.0000 mg | Freq: Once | INTRAVENOUS | Status: AC
  Administered 2021-02-22: 10 mg via INTRAVENOUS
  Filled 2021-02-22: qty 1

## 2021-02-22 MED ORDER — ATROPINE SULFATE 1 MG/ML IV SOLN
0.5000 mg | Freq: Once | INTRAVENOUS | Status: AC | PRN
  Administered 2021-02-22: 0.5 mg via INTRAVENOUS
  Filled 2021-02-22: qty 1

## 2021-02-22 NOTE — Progress Notes (Signed)
Pt presents today for Trodelvy per provider's order. Vital signs stable and labs WNL for treatment. Okay for treatment today.Pt voiced no new complaints at this time.  Jeremy Rodriguez given today per MD orders. Tolerated infusion without adverse affects. Vital signs stable. No complaints at this time. Discharged from clinic ambulatory in stable condition. Alert and oriented x 3. F/U with [facility] as scheduled. Pt declined AVS

## 2021-02-22 NOTE — Patient Instructions (Signed)
[facility]  Discharge Instructions: Thank you for choosing [facility] to provide your oncology and hematology care.  If you have a lab appointment with the [facility], please come in thru the Main Entrance and check in at the main information desk.  Wear comfortable clothing and clothing appropriate for easy access to any Portacath or PICC line.   We strive to give you quality time with your provider. You may need to reschedule your appointment if you arrive late (15 or more minutes).  Arriving late affects you and other patients whose appointments are after yours.  Also, if you miss three or more appointments without notifying the office, you may be dismissed from the clinic at the provider's discretion.      For prescription refill requests, have your pharmacy contact our office and allow 72 hours for refills to be completed.    Today you received the following chemotherapy and/or immunotherapy agents Trodelvy.   To help prevent nausea and vomiting after your treatment, we encourage you to take your nausea medication as directed.  BELOW ARE SYMPTOMS THAT SHOULD BE REPORTED IMMEDIATELY: *FEVER GREATER THAN 100.4 F (38 C) OR HIGHER *CHILLS OR SWEATING *NAUSEA AND VOMITING THAT IS NOT CONTROLLED WITH YOUR NAUSEA MEDICATION *UNUSUAL SHORTNESS OF BREATH *UNUSUAL BRUISING OR BLEEDING *URINARY PROBLEMS (pain or burning when urinating, or frequent urination) *BOWEL PROBLEMS (unusual diarrhea, constipation, pain near the anus) TENDERNESS IN MOUTH AND THROAT WITH OR WITHOUT PRESENCE OF ULCERS (sore throat, sores in mouth, or a toothache) UNUSUAL RASH, SWELLING OR PAIN  UNUSUAL VAGINAL DISCHARGE OR ITCHING   Items with * indicate a potential emergency and should be followed up as soon as possible or go to the Emergency Department if any problems should occur.  Please show the CHEMOTHERAPY ALERT CARD or IMMUNOTHERAPY ALERT CARD at check-in to the Emergency  Department and triage nurse.  Should you have questions after your visit or need to cancel or reschedule your appointment, please contact [facility] [phone]  and follow the prompts.  Office hours are 8:00 a.m. to 4:30 p.m. Monday - Friday. Please note that voicemails left after 4:00 p.m. may not be returned until the following business day.  We are closed weekends and major holidays. You have access to a nurse at all times for urgent questions. Please call the main number to the clinic [phone] and follow the prompts.  For any non-urgent questions, you may also contact your provider using MyChart. We now offer e-Visits for anyone 45 and older to request care online for non-urgent symptoms. For details visit [email or web address].   Also download the MyChart app! Go to the app store, search "MyChart", open the app, select [facility], and log in with your MyChart username and password.  Due to Covid, a mask is required upon entering the hospital/clinic. If you do not have a mask, one will be given to you upon arrival. For doctor visits, patients may have 1 support person aged 65 or older with them. For treatment visits, patients cannot have anyone with them due to current Covid guidelines and our immunocompromised population.

## 2021-02-24 DIAGNOSIS — C689 Malignant neoplasm of urinary organ, unspecified: Secondary | ICD-10-CM | POA: Diagnosis not present

## 2021-02-26 ENCOUNTER — Encounter (HOSPITAL_COMMUNITY): Payer: Self-pay

## 2021-02-27 ENCOUNTER — Encounter (HOSPITAL_COMMUNITY): Payer: Self-pay

## 2021-02-28 ENCOUNTER — Encounter (HOSPITAL_COMMUNITY): Payer: Self-pay | Admitting: *Deleted

## 2021-03-02 ENCOUNTER — Telehealth (HOSPITAL_COMMUNITY): Payer: Self-pay

## 2021-03-02 ENCOUNTER — Other Ambulatory Visit (HOSPITAL_COMMUNITY): Payer: Self-pay

## 2021-03-02 NOTE — Telephone Encounter (Signed)
Patient called pertaining to complaints of constipation and diarrhea. Patient denies pain today and states his last bowel movement was two days ago. Patient states, " I was told Dr. Delton Coombes could give me something different to help with this problem next time I am there." Patient was taking Colace, Miralax, Milk of Magnesium . Patient teaching performed. Patient denies diarrhea today and states last bowel movement was two days ago but he is concerned this will happen again.   Patient called 03/01/21 and spoke with Elmore Guise RN, note placed that she advised patient to get a fleets enema or a dulcolax suppository. Reiterated to patient. Instructed patient to increase water intake, increase fiber rich foods and use over the counter stool softeners as needed.

## 2021-03-04 ENCOUNTER — Ambulatory Visit (HOSPITAL_COMMUNITY): Payer: Medicare Other | Admitting: Hematology

## 2021-03-04 ENCOUNTER — Ambulatory Visit (HOSPITAL_COMMUNITY): Payer: Medicare Other

## 2021-03-04 ENCOUNTER — Other Ambulatory Visit (HOSPITAL_COMMUNITY): Payer: Medicare Other

## 2021-03-10 NOTE — Progress Notes (Signed)
Jeremy Rodriguez, [place] [address]   CLINIC:  Medical Oncology/Hematology  PCP:  Lindell Spar, MD [address] / [place] [address] [phone]   REASON FOR VISIT:  Follow-up for metastatic urothelial carcinoma  PRIOR THERAPY: Keytruda every 3 weeks  NGS Results: not done  CURRENT THERAPY: Enfortumab every 4 weeks  BRIEF ONCOLOGIC HISTORY:  Oncology History  Metastatic urothelial carcinoma (Jeremy Rodriguez)  08/13/2020 Initial Diagnosis   Metastatic urothelial carcinoma (Jeremy Rodriguez)   08/13/2020 Cancer Staging   Staging form: Urinary Bladder, AJCC 8th Edition - Clinical stage from 08/13/2020: Stage IVB (cTX, cN0, pM1b) - Signed by Derek Jack, MD on 08/13/2020 Stage prefix: Initial diagnosis WHO/ISUP grade (low/high): High Grade Histologic grading system: 2 grade system    08/20/2020 - 10/22/2020 Chemotherapy          11/19/2020 - 01/28/2021 Chemotherapy   Patient is on Treatment Plan : UROTHELIAL LOCALLY ADVANCED / METASTATIC Enfortumab q28d     02/15/2021 -  Chemotherapy   Patient is on Treatment Plan : BLADDER Sacituzumab govitecan-hziy Ivette Loyal) q21d       CANCER STAGING:  Cancer Staging  Metastatic urothelial carcinoma () Staging form: Urinary Bladder, AJCC 8th Edition - Clinical stage from 08/13/2020: Stage IVB (cTX, cN0, pM1b) - Signed by Derek Jack, MD on 08/13/2020   INTERVAL HISTORY:  [name], a 70 y.o. male, returns for routine follow-up and consideration for next cycle of chemotherapy. Jeremy Rodriguez was last seen on 02/11/2021.  Due for cycle #2 of Enfortumab today.   Overall, he tells me he has been feeling pretty well. He has lost 2 pounds since starting treatment. He reports constipation accompanied by abdominal pain for which he is taking Senna-S and stool softener which helps. He reports one episode of disrupted sleep last night which was not helped by Xanax. He denies nausea and vomiting,  and his appetite is good; he continues to take Megace. He is taking Protonix. His abdominal pain has improved for which he taking oxycodone once daily. He reports intermittent blurry vision of distant objects which occurs in periods of 5-10 minutes, and has increased in frequency since starting Enfortumab.   Overall, he feels ready for next cycle of chemo today.   REVIEW OF SYSTEMS:  Review of Systems  Constitutional:  Positive for unexpected weight change (-2 lbs). Negative for appetite change and fatigue.  Eyes:  Positive for eye problems (blurry vision).  Respiratory:  Positive for shortness of breath.   Gastrointestinal:  Positive for abdominal pain (improved) and constipation. Negative for nausea and vomiting.  Neurological:  Positive for numbness.  Psychiatric/Behavioral:  Positive for sleep disturbance.   All other systems reviewed and are negative.  PAST MEDICAL/SURGICAL HISTORY:  Past Medical History:  Diagnosis Date   Chronic kidney disease    GERD (gastroesophageal reflux disease)    Hypertension    Medical history non-contributory    Past Surgical History:  Procedure Laterality Date   appendectomy      SOCIAL HISTORY:  Social History   Socioeconomic History   Marital status: Single    Spouse name: Not on file   Number of children: Not on file   Years of education: Not on file   Highest education level: Not on file  Occupational History   Not on file  Tobacco Use   Smoking status: Some Days   Smokeless tobacco: Never   Tobacco comments:    a few cigarettes a day  Substance  and Sexual Activity   Alcohol use: Yes    Comment: occ   Drug use: Never   Sexual activity: Not on file  Other Topics Concern   Not on file  Social History Narrative   Not on file   Social Determinants of Health   Financial Resource Strain: Low Risk    Difficulty of Paying Living Expenses: Not hard at all  Food Insecurity: No Food Insecurity   Worried About Running Out of Food in  the Last Year: Never true   [place] in the Last Year: Never true  Transportation Needs: No Transportation Needs   Lack of Transportation (Medical): No   Lack of Transportation (Non-Medical): No  Physical Activity: Inactive   Days of Exercise per Week: 5 days   Minutes of Exercise per Session: 0 min  Stress: No Stress Concern Present   Feeling of Stress : Not at all  Social Connections: Moderately Isolated   Frequency of Communication with Friends and Family: More than three times a week   Frequency of Social Gatherings with Friends and Family: More than three times a week   Attends Religious Services: More than 4 times per year   Active Member of [employer] or Organizations: No   Attends Music therapist: Never   Marital Status: Divorced  Human resources officer Violence: Not At Risk   Fear of Current or Ex-Partner: No   Emotionally Abused: No   Physically Abused: No   Sexually Abused: No    FAMILY HISTORY:  Family History  Problem Relation Age of Onset   Colon cancer Neg Hx    Colon polyps Neg Hx    Liver cancer Neg Hx    Pancreatic cancer Neg Hx     CURRENT MEDICATIONS:  Current Outpatient Medications  Medication Sig Dispense Refill   ALPRAZolam (XANAX) 0.5 MG tablet Take 1 tablet (0.5 mg total) by mouth 2 (two) times daily as needed for anxiety or sleep. 60 tablet 0   dicyclomine (BENTYL) 10 MG capsule Take 1 capsule (10 mg total) by mouth 3 (three) times daily as needed for spasms. 30 capsule 3   docusate sodium (COLACE) 100 MG capsule Take 1 capsule (100 mg total) by mouth daily as needed for mild constipation. 30 capsule 0   ibuprofen (ADVIL) 800 MG tablet Take 1 tablet (800 mg total) by mouth 2 (two) times daily as needed. 30 tablet 0   Lactulose 20 GM/30ML SOLN Take 30 mLs (20 g total) by mouth daily. 473 mL 6   megestrol (MEGACE) 400 MG/10ML suspension Take 10 mLs (400 mg total) by mouth daily. 480 mL 3   oxyCODONE 20 MG TABS Take 0.75 tablets (15 mg total)  by mouth every 8 (eight) hours as needed for pain. 84 tablet 0   pantoprazole (PROTONIX) 40 MG tablet Take 1 tablet (40 mg total) by mouth daily. 30 tablet 6   prochlorperazine (COMPAZINE) 10 MG tablet Take 1 tablet (10 mg total) by mouth every 6 (six) hours as needed for nausea or vomiting. 30 tablet 3   tadalafil (CIALIS) 10 MG tablet Take 2 tablets (20 mg total) by mouth daily as needed for erectile dysfunction. 30 tablet 3   No current facility-administered medications for this visit.   Facility-Administered Medications Ordered in Other Visits  Medication Dose Route Frequency Provider Last Rate Last Admin   0.9 %  sodium chloride infusion   Intravenous Once Derek Jack, MD       acetaminophen (TYLENOL)  tablet 650 mg  650 mg Oral Once Derek Jack, MD       dexamethasone (DECADRON) 10 mg in sodium chloride 0.9 % 50 mL IVPB  10 mg Intravenous Once Derek Jack, MD       diphenhydrAMINE (BENADRYL) injection 50 mg  50 mg Intravenous Once Derek Jack, MD       famotidine (PEPCID) IVPB 20 mg in NS 100 mL IVPB  20 mg Intravenous Once Derek Jack, MD       fosaprepitant (EMEND) 150 mg in sodium chloride 0.9 % 145 mL IVPB  150 mg Intravenous Once Derek Jack, MD       heparin lock flush 100 unit/mL  500 Units Intracatheter Once PRN Derek Jack, MD       palonosetron (ALOXI) injection 0.25 mg  0.25 mg Intravenous Once Derek Jack, MD       sacituzumab govitecan-hziy (TRODELVY) 670 mg in sodium chloride 0.9 % 250 mL (2.1136 mg/mL) chemo infusion  10 mg/kg (Order-Specific) Intravenous Once Derek Jack, MD       sodium chloride flush (NS) 0.9 % injection 10 mL  10 mL Intracatheter PRN Derek Jack, MD        ALLERGIES:  No Known Allergies  PHYSICAL EXAM:  Performance status (ECOG): 1 - Symptomatic but completely ambulatory  Vitals:   03/11/21 0907  BP: (!) 156/81  Pulse: 81  Resp: 18  Temp: 98.2 F (36.8 C)   SpO2: 98%   Wt Readings from Last 3 Encounters:  03/11/21 145 lb 4.5 oz (65.9 kg)  02/22/21 146 lb 3.2 oz (66.3 kg)  02/15/21 150 lb 6.4 oz (68.2 kg)   Physical Exam Vitals reviewed.  Constitutional:      Appearance: Normal appearance.  Cardiovascular:     Rate and Rhythm: Normal rate and regular rhythm.     Pulses: Normal pulses.     Heart sounds: Normal heart sounds.  Pulmonary:     Effort: Pulmonary effort is normal.     Breath sounds: Normal breath sounds.  Abdominal:     Palpations: Abdomen is soft. There is hepatomegaly (liver edge palpable on R side). There is no splenomegaly or mass.     Tenderness: There is no abdominal tenderness.  Musculoskeletal:     Right lower leg: No edema.     Left lower leg: No edema.  Neurological:     General: No focal deficit present.     Mental Status: He is alert and oriented to person, place, and time.  Psychiatric:        Mood and Affect: Mood normal.        Behavior: Behavior normal.    LABORATORY DATA:  I have reviewed the labs as listed.  CBC Latest Ref Rng & Units 03/11/2021 02/22/2021 02/15/2021  WBC 4.0 - 10.5 K/uL 4.9 5.2 6.7  Hemoglobin 13.0 - 17.0 g/dL 12.1(L) 12.5(L) 12.8(L)  Hematocrit 39.0 - 52.0 % 34.7(L) 35.9(L) 37.7(L)  Platelets 150 - 400 K/uL 278 371 319   CMP Latest Ref Rng & Units 03/11/2021 02/22/2021 02/15/2021  Glucose 70 - 99 mg/dL 168(H) 135(H) 139(H)  BUN 8 - 23 mg/dL _0 Creatinine 0.61 - 1.24 mg/dL 0.90 1.11 1.12  Sodium 135 - 145 mmol/L 136 131(L) 135  Potassium 3.5 - 5.1 mmol/L 3.7 4.0 4.2  Chloride 98 - 111 mmol/L 111 102 105  CO2 22 - 32 mmol/L 18(L) 21(L) 23  Calcium 8.9 - 10.3 mg/dL 8.9 8.9 9.1  Total Protein 6.5 -  8.1 g/dL 6.8 7.3 7.5  Total Bilirubin 0.3 - 1.2 mg/dL 0.2(L) 1.0 0.4  Alkaline Phos 38 - 126 U/L 82 93 70  AST 15 - 41 U/L _0 ALT 0 - 44 U/L _1 DIAGNOSTIC IMAGING:  I have independently reviewed the scans and discussed with the patient. No results found.    ASSESSMENT:  1. 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 - 20 pound weight loss in the last 4 months due to decreased appetite.  Left upper quadrant pain for the past 2 to 3 months. - CTAP with contrast on 07/10/2020 showed ill-defined low-density mass in the right hepatic lobe measuring 2 x 2 cm.  Left upper pole kidney mass measuring 7.6 x 5.9 cm with encasement and narrowing of the left renal artery and vein.  No intravascular thrombus noted.  Mass encases the left adrenal gland.  Small right adrenal nodule measuring 11 mm, likely benign.  No other evidence of metastatic disease. - MRI of the liver with and without contrast on 07/20/2020 shows 2.2 x 1.7 cm enhancing lesion in the medial aspect of the segment 7, suspicious for metastasis.  Left renal mass, 6 x 7.6 x 6.9 cm infiltrating enhancing left upper pole renal mass, extending into the left renal sinus.  Possible TCC or lymphoma rather than renal cell carcinoma. - CT chest on 07/20/2020 with no evidence of metastatic disease. - PET scan on 08/04/2020 showed large hypermetabolic left upper pole mass and 2 hypermetabolic lesions in the right hepatic lobe favoring metastatic disease.  No hypermetabolic adenopathy.  - Liver biopsy showed poorly differentiated metastatic urothelial carcinoma.  IHC positive for CK5/6, CK7, GATA3, p40 and patchy positivity with p63 and PAX8. - He refused any chemotherapy. - 4 cycles of pembrolizumab from 08/20/2020 through 10/22/2020 with progression. - NGS testing showed benefit from pembrolizumab.  No mutations involving FGFR, NTRK was found.  Test was limited due to small sample. - CT CAP on 11/11/2020 showed interval progression of liver metastasis.  No significant change infiltrative mass arising in the upper pole of the left kidney invading the medial aspect of the upper spleen, left renal vein and left  adrenal gland with soft tissue infiltration to the left retroperitoneal fat.  No evidence of metastatic disease to the chest. - Progression on 3 cycles of enfortumab vedotin. - CT CAP on 02/15/2021 showed decrease in size of the left renal mass.  Interval progression of numerous liver metastasis.   2.  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 - Reports exposure to carbon black dust.  Smoked half pack per day for 30 years, currently smoking 2 to 3 cigarettes/day. - No family history of malignancy.   PLAN:  1. Metastatic poorly differentiated urothelial carcinoma arising in the renal pelvis: - He has tolerated first cycle of sacituzumab govitecan reasonably well. - He reported hair thinning.  He reported pain in the left upper quadrant has improved. - He reported occasional blurring of vision of distant objects, lasting about 5 to 10 minutes. - Reviewed his labs today which showed normal chemistries and LFTs.  CBC was grossly normal. - We will proceed with cycle 2 of sacituzumab govitecan.  Will discontinue atropine as he did not have any diarrhea.  This will help with his constipation. - We will see him back in  3 weeks for follow-up.     2.  Left upper quadrant pain: - Pain has improved since start of trastuzumab. - He is taking oxycodone as needed and ibuprofen alternating with it.  He is trying to stay away from oxycodone due to constipation.  3.  Constipation: - He is taking senna 2 tablets at bedtime every day which is helping. - We have also recommended to use mag citrate as needed.  He may also use lactulose as needed.  He does not want Relistor injection.  4.  Normocytic to microcytic anemia: - Hemoglobin has improved to 12.1 after Feraheme infusion.  5.  Loss of appetite: - Continue Megace 400 mg twice daily.  6.  Anxiety: - He may increase Xanax to 1 mg at bedtime as needed.  Addendum: After receiving Benadryl, he has  become disoriented and confused.  He was also trying to pull out his IV.  We have given Ativan 1 mg IV.  1 hour after giving Ativan, he has calmed down. His girlfriend reports that he has been agitated at home and has not slept all night last night.  I have recommended MRI of the brain with and without contrast at the next available appointment.   Orders placed this encounter:  No orders of the defined types were placed in this encounter.    Derek Jack, MD [place] [phone]   I, Thana Ates, am acting as a scribe for Dr. Derek Jack.  I, Derek Jack MD, have reviewed the above documentation for accuracy and completeness, and I agree with the above.

## 2021-03-11 ENCOUNTER — Other Ambulatory Visit (HOSPITAL_COMMUNITY): Payer: Medicare Other

## 2021-03-11 ENCOUNTER — Inpatient Hospital Stay (HOSPITAL_COMMUNITY): Payer: Medicare Other | Attending: Hematology

## 2021-03-11 ENCOUNTER — Encounter (HOSPITAL_COMMUNITY): Payer: Self-pay | Admitting: Hematology

## 2021-03-11 ENCOUNTER — Inpatient Hospital Stay (HOSPITAL_COMMUNITY): Payer: Medicare Other

## 2021-03-11 ENCOUNTER — Other Ambulatory Visit: Payer: Self-pay

## 2021-03-11 ENCOUNTER — Inpatient Hospital Stay (HOSPITAL_BASED_OUTPATIENT_CLINIC_OR_DEPARTMENT_OTHER): Payer: Medicare Other | Admitting: Hematology

## 2021-03-11 VITALS — BP 156/81 | HR 81 | Temp 98.2°F | Resp 18 | Ht 65.55 in | Wt 145.3 lb

## 2021-03-11 DIAGNOSIS — D509 Iron deficiency anemia, unspecified: Secondary | ICD-10-CM | POA: Diagnosis not present

## 2021-03-11 DIAGNOSIS — C7910 Secondary malignant neoplasm of unspecified urinary organs: Secondary | ICD-10-CM

## 2021-03-11 DIAGNOSIS — C652 Malignant neoplasm of left renal pelvis: Secondary | ICD-10-CM | POA: Diagnosis not present

## 2021-03-11 DIAGNOSIS — C787 Secondary malignant neoplasm of liver and intrahepatic bile duct: Secondary | ICD-10-CM | POA: Diagnosis not present

## 2021-03-11 DIAGNOSIS — C679 Malignant neoplasm of bladder, unspecified: Secondary | ICD-10-CM | POA: Diagnosis present

## 2021-03-11 DIAGNOSIS — C7900 Secondary malignant neoplasm of unspecified kidney and renal pelvis: Secondary | ICD-10-CM | POA: Diagnosis not present

## 2021-03-11 DIAGNOSIS — K219 Gastro-esophageal reflux disease without esophagitis: Secondary | ICD-10-CM | POA: Diagnosis not present

## 2021-03-11 LAB — COMPREHENSIVE METABOLIC PANEL
ALT: 27 U/L (ref 0–44)
AST: 22 U/L (ref 15–41)
Albumin: 3.3 g/dL — ABNORMAL LOW (ref 3.5–5.0)
Alkaline Phosphatase: 82 U/L (ref 38–126)
Anion gap: 7 (ref 5–15)
BUN: 16 mg/dL (ref 8–23)
CO2: 18 mmol/L — ABNORMAL LOW (ref 22–32)
Calcium: 8.9 mg/dL (ref 8.9–10.3)
Chloride: 111 mmol/L (ref 98–111)
Creatinine, Ser: 0.9 mg/dL (ref 0.61–1.24)
GFR, Estimated: >60 mL/min (ref >60)
Glucose, Bld: 168 mg/dL — ABNORMAL HIGH (ref 70–99)
Potassium: 3.7 mmol/L (ref 3.5–5.1)
Sodium: 136 mmol/L (ref 135–145)
Total Bilirubin: 0.2 mg/dL — ABNORMAL LOW (ref 0.3–1.2)
Total Protein: 6.8 g/dL (ref 6.5–8.1)

## 2021-03-11 LAB — CBC WITH DIFFERENTIAL/PLATELET
Abs Immature Granulocytes: 0.01 10*3/uL (ref 0.00–0.07)
Basophils Absolute: 0 10*3/uL (ref 0.0–0.1)
Basophils Relative: 1 %
Eosinophils Absolute: 0.1 10*3/uL (ref 0.0–0.5)
Eosinophils Relative: 1 %
HCT: 34.7 % — ABNORMAL LOW (ref 39.0–52.0)
Hemoglobin: 12.1 g/dL — ABNORMAL LOW (ref 13.0–17.0)
Immature Granulocytes: 0 %
Lymphocytes Relative: 31 %
Lymphs Abs: 1.5 10*3/uL (ref 0.7–4.0)
MCH: 30 pg (ref 26.0–34.0)
MCHC: 34.9 g/dL (ref 30.0–36.0)
MCV: 86.1 fL (ref 80.0–100.0)
Monocytes Absolute: 0.7 10*3/uL (ref 0.1–1.0)
Monocytes Relative: 14 %
Neutro Abs: 2.6 10*3/uL (ref 1.7–7.7)
Neutrophils Relative %: 53 %
Platelets: 278 10*3/uL (ref 150–400)
RBC: 4.03 MIL/uL — ABNORMAL LOW (ref 4.22–5.81)
RDW: 14.6 % (ref 11.5–15.5)
WBC: 4.9 10*3/uL (ref 4.0–10.5)
nRBC: 0 % (ref 0.0–0.2)

## 2021-03-11 LAB — MAGNESIUM: Magnesium: 2 mg/dL (ref 1.7–2.4)

## 2021-03-11 LAB — PHOSPHORUS: Phosphorus: 3.3 mg/dL (ref 2.5–4.6)

## 2021-03-11 MED ORDER — SODIUM CHLORIDE 0.9 % IV SOLN: Freq: Once | INTRAVENOUS | Status: AC

## 2021-03-11 MED ORDER — FAMOTIDINE IN NACL 20-0.9 MG/50ML-% IV SOLN
20.0000 mg | Freq: Once | INTRAVENOUS | Status: AC
  Administered 2021-03-11: 20 mg via INTRAVENOUS
  Filled 2021-03-11: qty 50

## 2021-03-11 MED ORDER — SODIUM CHLORIDE 0.9 % IV SOLN
10.0000 mg | Freq: Once | INTRAVENOUS | Status: AC
  Administered 2021-03-11: 10 mg via INTRAVENOUS
  Filled 2021-03-11: qty 10

## 2021-03-11 MED ORDER — PALONOSETRON HCL INJECTION 0.25 MG/5ML
0.2500 mg | Freq: Once | INTRAVENOUS | Status: AC
  Administered 2021-03-11: 0.25 mg via INTRAVENOUS
  Filled 2021-03-11: qty 5

## 2021-03-11 MED ORDER — DIPHENHYDRAMINE HCL 50 MG/ML IJ SOLN
50.0000 mg | Freq: Once | INTRAMUSCULAR | Status: AC
  Administered 2021-03-11: 50 mg via INTRAVENOUS
  Filled 2021-03-11: qty 1

## 2021-03-11 MED ORDER — LORAZEPAM 2 MG/ML IJ SOLN
0.5000 mg | Freq: Once | INTRAMUSCULAR | Status: AC
  Administered 2021-03-11: 0.5 mg via INTRAVENOUS
  Filled 2021-03-11: qty 1

## 2021-03-11 MED ORDER — SODIUM CHLORIDE 0.9 % IV SOLN
150.0000 mg | Freq: Once | INTRAVENOUS | Status: AC
  Administered 2021-03-11: 150 mg via INTRAVENOUS
  Filled 2021-03-11: qty 150

## 2021-03-11 MED ORDER — HALOPERIDOL LACTATE 5 MG/ML IJ SOLN
0.5000 mg | Freq: Once | INTRAMUSCULAR | Status: DC
  Filled 2021-03-11: qty 0.1

## 2021-03-11 MED ORDER — FAMOTIDINE 20 MG IN NS 100 ML IVPB
20.0000 mg | Freq: Once | INTRAVENOUS | Status: DC
  Filled 2021-03-11: qty 100

## 2021-03-11 MED ORDER — HEPARIN SOD (PORK) LOCK FLUSH 100 UNIT/ML IV SOLN: 500.0000 [IU] | Freq: Once | INTRAVENOUS | Status: DC | PRN

## 2021-03-11 MED ORDER — ACETAMINOPHEN 325 MG PO TABS
650.0000 mg | ORAL_TABLET | Freq: Once | ORAL | Status: AC
  Administered 2021-03-11: 650 mg via ORAL
  Filled 2021-03-11: qty 2

## 2021-03-11 MED ORDER — SODIUM CHLORIDE 0.9 % IV SOLN
10.0000 mg/kg | Freq: Once | INTRAVENOUS | Status: AC
  Administered 2021-03-11: 670 mg via INTRAVENOUS
  Filled 2021-03-11: qty 67

## 2021-03-11 MED ORDER — SODIUM CHLORIDE 0.9% FLUSH: 10.0000 mL | INTRAVENOUS | Status: DC | PRN

## 2021-03-11 NOTE — Patient Instructions (Addendum)
[place] at [facility] Discharge Instructions   You were seen and examined today by Dr. Delton Coombes.  He reviewed the results of you lab work which was normal/stable.  The special testing we sent did not show any mutations that can be targeted with therapy.   We will proceed with treatment today and next week.  We will see you back in 3 weeks at the start of cycle 3 of treatment.   Thank you for choosing [place] at [facility] to provide your oncology and hematology care.  To afford each patient quality time with our provider, please arrive at least 15 minutes before your scheduled appointment time.   If you have a lab appointment with the [facility] please come in thru the Main Entrance and check in at the main information desk.  You need to re-schedule your appointment should you arrive 10 or more minutes late.  We strive to give you quality time with our providers, and arriving late affects you and other patients whose appointments are after yours.  Also, if you no show three or more times for appointments you may be dismissed from the clinic at the providers discretion.     Again, thank you for choosing [facility].  Our hope is that these requests will decrease the amount of time that you wait before being seen by our physicians.       _____________________________________________________________  Should you have questions after your visit to [facility], please contact our office at [phone] and follow the prompts.  Our office hours are 8:00 a.m. and 4:30 p.m. Monday - Friday.  Please note that voicemails left after 4:00 p.m. may not be returned until the following business day.  We are closed weekends and major holidays.  You do have access to a nurse 24-7, just call the main number to the clinic [phone] and do not press any options, hold on the line and a nurse will answer the phone.    For  prescription refill requests, have your pharmacy contact our office and allow 72 hours.    Due to Covid, you will need to wear a mask upon entering the hospital. If you do not have a mask, a mask will be given to you at the Main Entrance upon arrival. For doctor visits, patients may have 1 support person age 69 or older with them. For treatment visits, patients can not have anyone with them due to social distancing guidelines and our immunocompromised population.

## 2021-03-11 NOTE — Progress Notes (Signed)
Patient presents today for Trodelvy infusion per providers order.  Vital signs and labs within parameters for treatment.  Messaged received from Anastasio Champion RN and Dr. Delton Coombes patient okay for treatment.    Peripheral IV started and blood return noted prior to infusion.    Patient restless and picking at IV and not acting quite right.  Dr. Delton Coombes notified and he came to see the patient.  IV Ativan ordered per Dr. Delton Coombes.    Patient continues to be restless, picking at his IV and confused.  Dr. Delton Coombes notified.  MRI of the brain scheduled per Dr. Delton Coombes.  Haldo ordered but not given due to patient falling asleep prior to administration.  Haldol wasted in steri cycle container.  Ivette Loyal given today per MD orders.  Stable during infusion without adverse affects.  Vital signs stable.  No complaints at this time.  Discharge from clinic via wheel chair due to droswiness, in stable condition.  Alert and oriented X 3.  Follow up with [facility] as scheduled.

## 2021-03-11 NOTE — Patient Instructions (Signed)
[facility]  Discharge Instructions: Thank you for choosing [facility] to provide your oncology and hematology care.  If you have a lab appointment with the [facility], please come in thru the Main Entrance and check in at the main information desk.  Wear comfortable clothing and clothing appropriate for easy access to any Portacath or PICC line.   We strive to give you quality time with your provider. You may need to reschedule your appointment if you arrive late (15 or more minutes).  Arriving late affects you and other patients whose appointments are after yours.  Also, if you miss three or more appointments without notifying the office, you may be dismissed from the clinic at the provider's discretion.      For prescription refill requests, have your pharmacy contact our office and allow 72 hours for refills to be completed.    Today you received the following chemotherapy and/or immunotherapy agents Jeremy Rodriguez      To help prevent nausea and vomiting after your treatment, we encourage you to take your nausea medication as directed.  BELOW ARE SYMPTOMS THAT SHOULD BE REPORTED IMMEDIATELY: *FEVER GREATER THAN 100.4 F (38 C) OR HIGHER *CHILLS OR SWEATING *NAUSEA AND VOMITING THAT IS NOT CONTROLLED WITH YOUR NAUSEA MEDICATION *UNUSUAL SHORTNESS OF BREATH *UNUSUAL BRUISING OR BLEEDING *URINARY PROBLEMS (pain or burning when urinating, or frequent urination) *BOWEL PROBLEMS (unusual diarrhea, constipation, pain near the anus) TENDERNESS IN MOUTH AND THROAT WITH OR WITHOUT PRESENCE OF ULCERS (sore throat, sores in mouth, or a toothache) UNUSUAL RASH, SWELLING OR PAIN  UNUSUAL VAGINAL DISCHARGE OR ITCHING   Items with * indicate a potential emergency and should be followed up as soon as possible or go to the Emergency Department if any problems should occur.  Please show the CHEMOTHERAPY ALERT CARD or IMMUNOTHERAPY ALERT CARD at check-in to the Emergency  Department and triage nurse.  Should you have questions after your visit or need to cancel or reschedule your appointment, please contact [facility] [phone]  and follow the prompts.  Office hours are 8:00 a.m. to 4:30 p.m. Monday - Friday. Please note that voicemails left after 4:00 p.m. may not be returned until the following business day.  We are closed weekends and major holidays. You have access to a nurse at all times for urgent questions. Please call the main number to the clinic [phone] and follow the prompts.  For any non-urgent questions, you may also contact your provider using MyChart. We now offer e-Visits for anyone 71 and older to request care online for non-urgent symptoms. For details visit [email or web address].   Also download the MyChart app! Go to the app store, search "MyChart", open the app, select [facility], and log in with your MyChart username and password.  Due to Covid, a mask is required upon entering the hospital/clinic. If you do not have a mask, one will be given to you upon arrival. For doctor visits, patients may have 1 support person aged 51 or older with them. For treatment visits, patients cannot have anyone with them due to current Covid guidelines and our immunocompromised population.

## 2021-03-17 ENCOUNTER — Telehealth: Payer: Self-pay | Admitting: General Practice

## 2021-03-17 ENCOUNTER — Other Ambulatory Visit: Payer: Self-pay

## 2021-03-17 ENCOUNTER — Encounter (HOSPITAL_COMMUNITY): Payer: Self-pay

## 2021-03-17 ENCOUNTER — Ambulatory Visit (HOSPITAL_COMMUNITY)
Admission: RE | Admit: 2021-03-17 | Discharge: 2021-03-17 | Disposition: A | Discharge status: Home / Self Care | Payer: Medicare Other | Source: Ambulatory Visit | Attending: Hematology | Admitting: Hematology

## 2021-03-17 ENCOUNTER — Other Ambulatory Visit (HOSPITAL_COMMUNITY): Payer: Self-pay | Admitting: Hematology

## 2021-03-17 ENCOUNTER — Ambulatory Visit (INDEPENDENT_AMBULATORY_CARE_PROVIDER_SITE_OTHER): Payer: Medicare Other | Admitting: Internal Medicine

## 2021-03-17 ENCOUNTER — Telehealth: Payer: Self-pay

## 2021-03-17 ENCOUNTER — Encounter: Payer: Self-pay | Admitting: Internal Medicine

## 2021-03-17 DIAGNOSIS — G4701 Insomnia due to medical condition: Secondary | ICD-10-CM | POA: Diagnosis not present

## 2021-03-17 DIAGNOSIS — R42 Dizziness and giddiness: Secondary | ICD-10-CM | POA: Diagnosis not present

## 2021-03-17 DIAGNOSIS — K5903 Drug induced constipation: Secondary | ICD-10-CM

## 2021-03-17 DIAGNOSIS — C7910 Secondary malignant neoplasm of unspecified urinary organs: Secondary | ICD-10-CM | POA: Insufficient documentation

## 2021-03-17 MED ORDER — LINACLOTIDE 145 MCG PO CAPS: 145.0000 ug | ORAL_CAPSULE | Freq: Every day | ORAL | 2 refills | Status: DC

## 2021-03-17 MED ORDER — ZOLPIDEM TARTRATE 10 MG PO TABS: 10.0000 mg | ORAL_TABLET | Freq: Every evening | ORAL | 2 refills | Status: DC | PRN

## 2021-03-17 NOTE — Telephone Encounter (Signed)
[facility] CSW Progress Notes  Call to daughter at request of Sylvester Harder, RN, Nurse Navigator.  Daughter has concerns re patients ability to make financial/legal/medical decisions.  She is observing significant changes in his mentation/behavior.  Advised her that she can request guardianship evaluation through [employer], provided information packet on this option via email.  Advised that if her concerns relate to potential financial abuse of a disabled person, she can contact Adult Protective Services in [facility] and make a report.  Edwyna Shell, LCSW Clinical Social Worker Phone:  [phone]

## 2021-03-17 NOTE — Progress Notes (Signed)
Call received from patient's daughter this morning asking if the patient is of sound mind to continue making life altering decisions. Patient's daughter interested in obtaining 40. Message sent to [facility] for further guidance on this matter.

## 2021-03-17 NOTE — Telephone Encounter (Signed)
Please add to patel for virtual visit to discuss

## 2021-03-17 NOTE — Assessment & Plan Note (Signed)
Has tried Xanax without much relief with insomnia Started Ambien for insomnia Can take Xanax during the day for severe anxiety/panic episode

## 2021-03-17 NOTE — Assessment & Plan Note (Signed)
Had episode of dizziness during last chemotherapy session Had MRI brain today, showed chronic infarcts again microvascular changes Would avoid adding aspirin or statin at this time Dizziness has improved now

## 2021-03-17 NOTE — Telephone Encounter (Signed)
Virtual appt scheduled

## 2021-03-17 NOTE — Progress Notes (Signed)
Virtual Visit via Telephone Note   This visit type was conducted due to national recommendations for restrictions regarding the COVID-19 Pandemic (e.g. social distancing) in an effort to limit this patient's exposure and mitigate transmission in our community.  Due to his co-morbid illnesses, this patient is at least at moderate risk for complications without adequate follow up.  This format is felt to be most appropriate for this patient at this time.  The patient did not have access to video technology/had technical difficulties with video requiring transitioning to audio format only (telephone).  All issues noted in this document were discussed and addressed.  No physical exam could be performed with this format.  Evaluation Performed:  Follow-up visit  Date:  03/17/2021   ID:  Jeremy Rodriguez, DOB [birth year], MRN [id number]  Patient Location: Home Provider Location: Office/Clinic  Participants: Patient Location of Patient: Home Location of Provider: Telehealth Consent was obtain for visit to be over via telehealth. I verified that I am speaking with the correct person using two identifiers.  PCP:  Lindell Spar, MD   Chief Complaint: Insomnia and constipation  History of Present Illness:    Jeremy Rodriguez is a 70 y.o. male who has a televisit for complaint of insomnia and opioid-induced constipation.  He was given Xanax for anxiety and insomnia by his oncologist.  He has tried taking Xanax 1 mg without any help with insomnia.  He remains anxious during the daytime as well.  He denies any SI or HI currently.  He was given oxycodone for cancer related pain by his oncologist.  He had severe constipation with it despite taking lactulose and Colace.  He was given Relistor once, with which he had a BM immediately and he did not like that.  He asked for any oral medicine for constipation for now.  He also asked for medical marijuana, which is not prescribed by [place].  He will look  for a clinic to get medical marijuana for chronic pain and anxiety.  He asked about his MRI brain from today, which showed chronic infarcts and microvascular changes, but no acute CVA.  His dizziness has improved since last chemotherapy.  The patient does not have symptoms concerning for COVID-19 infection (fever, chills, cough, or new shortness of breath).   Past Medical, Surgical, Social History, Allergies, and Medications have been Reviewed.  Past Medical History:  Diagnosis Date   Chronic kidney disease    GERD (gastroesophageal reflux disease)    Hypertension    Medical history non-contributory    Past Surgical History:  Procedure Laterality Date   appendectomy       Current Meds  Medication Sig   ALPRAZolam (XANAX) 0.5 MG tablet Take 1 tablet (0.5 mg total) by mouth 2 (two) times daily as needed for anxiety or sleep.   dicyclomine (BENTYL) 10 MG capsule Take 1 capsule (10 mg total) by mouth 3 (three) times daily as needed for spasms.   docusate sodium (COLACE) 100 MG capsule Take 1 capsule (100 mg total) by mouth daily as needed for mild constipation.   ibuprofen (ADVIL) 800 MG tablet Take 1 tablet (800 mg total) by mouth 2 (two) times daily as needed.   Lactulose 20 GM/30ML SOLN Take 30 mLs (20 g total) by mouth daily.   linaclotide (LINZESS) 145 MCG CAPS capsule Take 1 capsule (145 mcg total) by mouth daily before breakfast.   megestrol (MEGACE) 400 MG/10ML suspension Take 10 mLs (400 mg total) by mouth daily.  oxyCODONE 20 MG TABS Take 0.75 tablets (15 mg total) by mouth every 8 (eight) hours as needed for pain.   pantoprazole (PROTONIX) 40 MG tablet Take 1 tablet (40 mg total) by mouth daily.   prochlorperazine (COMPAZINE) 10 MG tablet Take 1 tablet (10 mg total) by mouth every 6 (six) hours as needed for nausea or vomiting.   tadalafil (CIALIS) 10 MG tablet Take 2 tablets (20 mg total) by mouth daily as needed for erectile dysfunction.   zolpidem (AMBIEN) 10 MG tablet  Take 1 tablet (10 mg total) by mouth at bedtime as needed for sleep.     Allergies:   Patient has no known allergies.   ROS:   Please see the history of present illness.     All other systems reviewed and are negative.   Labs/Other Tests and Data Reviewed:    Recent Labs: 02/05/2021: TSH 1.417 03/11/2021: ALT 27; BUN 16; Creatinine, Ser 0.90; Hemoglobin 12.1; Magnesium 2.0; Platelets 278; Potassium 3.7; Sodium 136   Recent Lipid Panel Lab Results  Component Value Date/Time   CHOL 180 07/15/2020 03:58 PM   TRIG 205 (H) 07/15/2020 03:58 PM   HDL 42 07/15/2020 03:58 PM   CHOLHDL 4.3 07/15/2020 03:58 PM   LDLCALC 103 (H) 07/15/2020 03:58 PM    Wt Readings from Last 3 Encounters:  03/11/21 145 lb 4.5 oz (65.9 kg)  02/22/21 146 lb 3.2 oz (66.3 kg)  02/15/21 150 lb 6.4 oz (68.2 kg)     ASSESSMENT & PLAN:    Insomnia due to medical condition Has tried Xanax without much relief with insomnia Started Ambien for insomnia Can take Xanax during the day for severe anxiety/panic episode  Dizziness Had episode of dizziness during last chemotherapy session Had MRI brain today, showed chronic infarcts again microvascular changes Would avoid adding aspirin or statin at this time Dizziness has improved now  Constipation Opioid induced Continue Colace and lactulose Added Linzess for now Strongly advised to consider Relistor injection Maintain adequate hydration by taking at least 64 ounces of fluid in a day.   Time:   Today, I have spent 25 minutes reviewing the chart, including problem list, medications, and with the patient with telehealth technology discussing the above problems.   Medication Adjustments/Labs and Tests Ordered: Current medicines are reviewed at length with the patient today.  Concerns regarding medicines are outlined above.   Tests Ordered: No orders of the defined types were placed in this encounter.   Medication Changes: Meds ordered this encounter   Medications   linaclotide (LINZESS) 145 MCG CAPS capsule    Sig: Take 1 capsule (145 mcg total) by mouth daily before breakfast.    Dispense:  30 capsule    Refill:  2   zolpidem (AMBIEN) 10 MG tablet    Sig: Take 1 tablet (10 mg total) by mouth at bedtime as needed for sleep.    Dispense:  30 tablet    Refill:  2     Note: This dictation was prepared with Dragon dictation along with smaller phrase technology. Similar sounding words can be transcribed inadequately or may not be corrected upon review. Any transcriptional errors that result from this process are unintentional.      Disposition:  Follow up  Signed, Lindell Spar, MD  03/17/2021 12:14 PM     [facility]

## 2021-03-17 NOTE — Patient Instructions (Signed)
Please take lactulose and Colace for constipation.  Please start taking Linzess as prescribed for constipation.  Please consider getting Relistor injection for opioid-induced constipation.  Please maintain at least 64 ounces of fluid intake in a day.  Start taking Ambien at bedtime for insomnia.  Okay to take Xanax during the daytime for severe anxiety/panic episode.

## 2021-03-17 NOTE — Assessment & Plan Note (Signed)
Opioid induced Continue Colace and lactulose Added Linzess for now Strongly advised to consider Relistor injection Maintain adequate hydration by taking at least 64 ounces of fluid in a day.

## 2021-03-17 NOTE — Telephone Encounter (Signed)
Patient came by the office and needs for the clinic nurse to give patient a call regarding the MRI he just had done and about the infusion tomorrow., Trouble sleeping now for 3 days and the pain medicine he is taking not helping,  he is taking 3 pain medicine instead of the 2 and not working.  Patient asking if he can get a medication or some medicine to help him sleep.  Please contact patient back [phone].

## 2021-03-18 ENCOUNTER — Inpatient Hospital Stay (HOSPITAL_COMMUNITY): Payer: Medicare Other

## 2021-03-18 ENCOUNTER — Ambulatory Visit (HOSPITAL_COMMUNITY): Payer: Medicare Other

## 2021-03-18 ENCOUNTER — Other Ambulatory Visit (HOSPITAL_COMMUNITY): Payer: Self-pay | Admitting: Hematology

## 2021-03-18 VITALS — BP 152/64 | HR 82 | Temp 98.0°F | Resp 18 | Ht 65.0 in | Wt 143.2 lb

## 2021-03-18 DIAGNOSIS — C679 Malignant neoplasm of bladder, unspecified: Secondary | ICD-10-CM | POA: Diagnosis not present

## 2021-03-18 DIAGNOSIS — C7910 Secondary malignant neoplasm of unspecified urinary organs: Secondary | ICD-10-CM

## 2021-03-18 LAB — CBC WITH DIFFERENTIAL/PLATELET
Abs Immature Granulocytes: 0.09 10*3/uL — ABNORMAL HIGH (ref 0.00–0.07)
Basophils Absolute: 0.1 10*3/uL (ref 0.0–0.1)
Basophils Relative: 1 %
Eosinophils Absolute: 0.1 10*3/uL (ref 0.0–0.5)
Eosinophils Relative: 2 %
HCT: 37.5 % — ABNORMAL LOW (ref 39.0–52.0)
Hemoglobin: 12.6 g/dL — ABNORMAL LOW (ref 13.0–17.0)
Immature Granulocytes: 1 %
Lymphocytes Relative: 30 %
Lymphs Abs: 2 10*3/uL (ref 0.7–4.0)
MCH: 29.2 pg (ref 26.0–34.0)
MCHC: 33.6 g/dL (ref 30.0–36.0)
MCV: 86.8 fL (ref 80.0–100.0)
Monocytes Absolute: 0.9 10*3/uL (ref 0.1–1.0)
Monocytes Relative: 13 %
Neutro Abs: 3.7 10*3/uL (ref 1.7–7.7)
Neutrophils Relative %: 53 %
Platelets: 390 10*3/uL (ref 150–400)
RBC: 4.32 MIL/uL (ref 4.22–5.81)
RDW: 14.5 % (ref 11.5–15.5)
WBC: 6.8 10*3/uL (ref 4.0–10.5)
nRBC: 0 % (ref 0.0–0.2)

## 2021-03-18 LAB — COMPREHENSIVE METABOLIC PANEL
ALT: 26 U/L (ref 0–44)
AST: 20 U/L (ref 15–41)
Albumin: 3.6 g/dL (ref 3.5–5.0)
Alkaline Phosphatase: 82 U/L (ref 38–126)
Anion gap: 9 (ref 5–15)
BUN: 20 mg/dL (ref 8–23)
CO2: 18 mmol/L — ABNORMAL LOW (ref 22–32)
Calcium: 9.2 mg/dL (ref 8.9–10.3)
Chloride: 108 mmol/L (ref 98–111)
Creatinine, Ser: 0.99 mg/dL (ref 0.61–1.24)
GFR, Estimated: >60 mL/min (ref >60)
Glucose, Bld: 130 mg/dL — ABNORMAL HIGH (ref 70–99)
Potassium: 4.3 mmol/L (ref 3.5–5.1)
Sodium: 135 mmol/L (ref 135–145)
Total Bilirubin: 0.3 mg/dL (ref 0.3–1.2)
Total Protein: 7.1 g/dL (ref 6.5–8.1)

## 2021-03-18 LAB — MAGNESIUM: Magnesium: 2 mg/dL (ref 1.7–2.4)

## 2021-03-18 LAB — PHOSPHORUS: Phosphorus: 4 mg/dL (ref 2.5–4.6)

## 2021-03-18 MED ORDER — ACETAMINOPHEN 325 MG PO TABS
650.0000 mg | ORAL_TABLET | Freq: Once | ORAL | Status: AC
  Administered 2021-03-18: 650 mg via ORAL
  Filled 2021-03-18: qty 2

## 2021-03-18 MED ORDER — FAMOTIDINE 20 MG IN NS 100 ML IVPB
20.0000 mg | Freq: Once | INTRAVENOUS | Status: DC
  Filled 2021-03-18: qty 100

## 2021-03-18 MED ORDER — SODIUM CHLORIDE 0.9 % IV SOLN
10.0000 mg | Freq: Once | INTRAVENOUS | Status: AC
  Administered 2021-03-18: 10 mg via INTRAVENOUS
  Filled 2021-03-18: qty 10

## 2021-03-18 MED ORDER — SODIUM CHLORIDE 0.9 % IV SOLN: Freq: Once | INTRAVENOUS | Status: AC

## 2021-03-18 MED ORDER — FAMOTIDINE IN NACL 20-0.9 MG/50ML-% IV SOLN
20.0000 mg | Freq: Two times a day (BID) | INTRAVENOUS | Status: DC
  Administered 2021-03-18: 20 mg via INTRAVENOUS
  Filled 2021-03-18 (×3): qty 50

## 2021-03-18 MED ORDER — PALONOSETRON HCL INJECTION 0.25 MG/5ML
0.2500 mg | Freq: Once | INTRAVENOUS | Status: AC
  Administered 2021-03-18: 0.25 mg via INTRAVENOUS
  Filled 2021-03-18: qty 5

## 2021-03-18 MED ORDER — SODIUM CHLORIDE 0.9 % IV SOLN
10.0000 mg/kg | Freq: Once | INTRAVENOUS | Status: AC
  Administered 2021-03-18: 670 mg via INTRAVENOUS
  Filled 2021-03-18: qty 67

## 2021-03-18 MED ORDER — SODIUM CHLORIDE 0.9 % IV SOLN
150.0000 mg | Freq: Once | INTRAVENOUS | Status: AC
  Administered 2021-03-18: 150 mg via INTRAVENOUS
  Filled 2021-03-18: qty 150

## 2021-03-18 NOTE — Progress Notes (Signed)
Pt presents today for Trodelvy per provider's order. Vital signs and labs WNL for treatment today. Okay to proceed with treatment per Dr.K  Ivette Loyal given today per MD orders. Tolerated infusion without adverse affects. Vital signs stable. No complaints at this time. Discharged from clinic ambulatory in stable condition. Alert and oriented x 3. F/U with [facility] as scheduled.

## 2021-03-18 NOTE — Patient Instructions (Signed)
Jeremy Rodriguez  Discharge Instructions: Thank you for choosing [facility] to provide your oncology and hematology care.  If you have a lab appointment with the [facility], please come in thru the Main Entrance and check in at the main information desk.  Wear comfortable clothing and clothing appropriate for easy access to any Portacath or PICC line.   We strive to give you quality time with your provider. You may need to reschedule your appointment if you arrive late (15 or more minutes).  Arriving late affects you and other patients whose appointments are after yours.  Also, if you miss three or more appointments without notifying the office, you may be dismissed from the clinic at the providers discretion.      For prescription refill requests, have your pharmacy contact our office and allow 72 hours for refills to be completed.    Today you received the following chemotherapy and/or immunotherapy agents Trodelvy.    To help prevent nausea and vomiting after your treatment, we encourage you to take your nausea medication as directed.  BELOW ARE SYMPTOMS THAT SHOULD BE REPORTED IMMEDIATELY: *FEVER GREATER THAN 100.4 F (38 C) OR HIGHER *CHILLS OR SWEATING *NAUSEA AND VOMITING THAT IS NOT CONTROLLED WITH YOUR NAUSEA MEDICATION *UNUSUAL SHORTNESS OF BREATH *UNUSUAL BRUISING OR BLEEDING *URINARY PROBLEMS (pain or burning when urinating, or frequent urination) *BOWEL PROBLEMS (unusual diarrhea, constipation, pain near the anus) TENDERNESS IN MOUTH AND THROAT WITH OR WITHOUT PRESENCE OF ULCERS (sore throat, sores in mouth, or a toothache) UNUSUAL RASH, SWELLING OR PAIN  UNUSUAL VAGINAL DISCHARGE OR ITCHING   Items with * indicate a potential emergency and should be followed up as soon as possible or go to the Emergency Department if any problems should occur.  Please show the CHEMOTHERAPY ALERT CARD or IMMUNOTHERAPY ALERT CARD at check-in to the Emergency  Department and triage nurse.  Should you have questions after your visit or need to cancel or reschedule your appointment, please contact [facility] [phone]  and follow the prompts.  Office hours are 8:00 a.m. to 4:30 p.m. Monday - Friday. Please note that voicemails left after 4:00 p.m. may not be returned until the following business day.  We are closed weekends and major holidays. You have access to a nurse at all times for urgent questions. Please call the main number to the clinic [phone] and follow the prompts.  For any non-urgent questions, you may also contact your provider using MyChart. We now offer e-Visits for anyone 73 and older to request care online for non-urgent symptoms. For details visit [email or web address].   Also download the MyChart app! Go to the app store, search "MyChart", open the app, select [facility], and log in with your MyChart username and password.  Due to Covid, a mask is required upon entering the hospital/clinic. If you do not have a mask, one will be given to you upon arrival. For doctor visits, patients may have 1 support person aged 36 or older with them. For treatment visits, patients cannot have anyone with them due to current Covid guidelines and our immunocompromised population.

## 2021-03-19 ENCOUNTER — Other Ambulatory Visit (HOSPITAL_COMMUNITY): Payer: Self-pay | Admitting: Hematology

## 2021-03-19 MED ORDER — OXYCODONE HCL 15 MG PO TABA: 15.0000 mg | ORAL_TABLET | Freq: Three times a day (TID) | ORAL | 0 refills | Status: DC | PRN

## 2021-03-19 MED ORDER — ALPRAZOLAM 0.5 MG PO TABS
0.5000 mg | ORAL_TABLET | Freq: Two times a day (BID) | ORAL | 0 refills | Status: DC | PRN
Start: 2021-03-19 — End: 2021-04-01

## 2021-03-20 ENCOUNTER — Encounter: Payer: Self-pay | Admitting: Internal Medicine

## 2021-03-24 ENCOUNTER — Telehealth: Payer: Medicare Other | Admitting: Internal Medicine

## 2021-03-26 ENCOUNTER — Other Ambulatory Visit (HOSPITAL_COMMUNITY): Payer: Self-pay | Admitting: Hematology

## 2021-03-30 ENCOUNTER — Other Ambulatory Visit (HOSPITAL_COMMUNITY): Payer: Self-pay | Admitting: *Deleted

## 2021-03-31 ENCOUNTER — Encounter (HOSPITAL_COMMUNITY): Payer: Self-pay | Admitting: Hematology

## 2021-03-31 NOTE — Progress Notes (Signed)
Jeremy Rodriguez, [place] [address]   CLINIC:  Medical Oncology/Hematology  PCP:  Jeremy Spar, MD [address] / [place] [address] [phone]   REASON FOR VISIT:  Follow-up for metastatic urothelial carcinoma  PRIOR THERAPY: Keytruda every 3 weeks  NGS Results: not done  CURRENT THERAPY: Enfortumab every 4 weeks  BRIEF ONCOLOGIC HISTORY:  Oncology History  Metastatic urothelial carcinoma ([place])  08/13/2020 Initial Diagnosis   Metastatic urothelial carcinoma ([place])   08/13/2020 Cancer Staging   Staging form: Urinary Bladder, AJCC 8th Edition - Clinical stage from 08/13/2020: Stage IVB (cTX, cN0, pM1b) - Signed by Jeremy Jack, MD on 08/13/2020 Stage prefix: Initial diagnosis WHO/ISUP grade (low/high): High Grade Histologic grading system: 2 grade system    08/20/2020 - 10/22/2020 Chemotherapy          11/19/2020 - 01/28/2021 Chemotherapy   Patient is on Treatment Plan : UROTHELIAL LOCALLY ADVANCED / METASTATIC Enfortumab q28d     02/15/2021 -  Chemotherapy   Patient is on Treatment Plan : BLADDER Sacituzumab govitecan-hziy Ivette Loyal) q21d       CANCER STAGING:  Cancer Staging  Metastatic urothelial carcinoma ([place]) Staging form: Urinary Bladder, AJCC 8th Edition - Clinical stage from 08/13/2020: Stage IVB (cTX, cN0, pM1b) - Signed by Jeremy Jack, MD on 08/13/2020   INTERVAL HISTORY:  [name], a 70 y.o. male, returns for routine follow-up and consideration for next cycle of chemotherapy. [name] was last seen on 03/11/2021.  Due for cycle #3 of Sacituzumab today.   Overall, he tells me he has been feeling pretty well. His constipation has resolved. He reports fatigue and SOB with exertion. He is taking 20 mg Oxycodone tablet 1-2 times daily. He has gained 2 lbs since his last visit. He denies ankle swellings.   Overall, he feels ready for next cycle of chemo today.    REVIEW OF SYSTEMS:   Review of Systems  Constitutional:  Positive for fatigue. Negative for appetite change and unexpected weight change (+2 lbs).  Respiratory:  Positive for shortness of breath.   Cardiovascular:  Negative for leg swelling.  Gastrointestinal:  Negative for constipation.  Neurological:  Positive for numbness.  All other systems reviewed and are negative.  PAST MEDICAL/SURGICAL HISTORY:  Past Medical History:  Diagnosis Date   Chronic kidney disease    GERD (gastroesophageal reflux disease)    Hypertension    Medical history non-contributory    Past Surgical History:  Procedure Laterality Date   appendectomy      SOCIAL HISTORY:  Social History   Socioeconomic History   Marital status: Single    Spouse name: Not on file   Number of children: Not on file   Years of education: Not on file   Highest education level: Not on file  Occupational History   Not on file  Tobacco Use   Smoking status: Some Days   Smokeless tobacco: Never   Tobacco comments:    a few cigarettes a day  Substance and Sexual Activity   Alcohol use: Yes    Comment: occ   Drug use: Never   Sexual activity: Not on file  Other Topics Concern   Not on file  Social History Narrative   Not on file   Social Determinants of Health   Financial Resource Strain: Low Risk    Difficulty of Paying Living Expenses: Not hard at all  Food Insecurity: No Food Insecurity   Worried About Running  Out of Food in the Last Year: Never true   Ran Out of Food in the Last Year: Never true  Transportation Needs: No Transportation Needs   Lack of Transportation (Medical): No   Lack of Transportation (Non-Medical): No  Physical Activity: Inactive   Days of Exercise per Week: 5 days   Minutes of Exercise per Session: 0 min  Stress: No Stress Concern Present   Feeling of Stress : Not at all  Social Connections: Moderately Isolated   Frequency of Communication with Friends and Family: More than three times a week    Frequency of Social Gatherings with Friends and Family: More than three times a week   Attends Religious Services: More than 4 times per year   Active Member of [employer] or Organizations: No   Attends Music therapist: Never   Marital Status: Divorced  Human resources officer Violence: Not At Risk   Fear of Current or Ex-Partner: No   Emotionally Abused: No   Physically Abused: No   Sexually Abused: No    FAMILY HISTORY:  Family History  Problem Relation Age of Onset   Colon cancer Neg Hx    Colon polyps Neg Hx    Liver cancer Neg Hx    Pancreatic cancer Neg Hx     CURRENT MEDICATIONS:  Current Outpatient Medications  Medication Sig Dispense Refill   ibuprofen (ADVIL) 800 MG tablet Take 1 tablet by mouth twice daily as needed 60 tablet 0   Lactulose 20 GM/30ML SOLN Take 30 mLs (20 g total) by mouth daily. 473 mL 6   linaclotide (LINZESS) 145 MCG CAPS capsule Take 1 capsule (145 mcg total) by mouth daily before breakfast. 30 capsule 2   megestrol (MEGACE) 400 MG/10ML suspension Take 10 mLs (400 mg total) by mouth daily. 480 mL 3   pantoprazole (PROTONIX) 40 MG tablet Take 1 tablet (40 mg total) by mouth daily. 30 tablet 6   ALPRAZolam (XANAX) 1 MG tablet Take 0.5 tablets (0.5 mg total) by mouth at bedtime as needed for anxiety or sleep. 30 tablet 1   dicyclomine (BENTYL) 10 MG capsule Take 1 capsule (10 mg total) by mouth 3 (three) times daily as needed for spasms. (Patient not taking: Reported on 04/01/2021) 30 capsule 3   docusate sodium (COLACE) 100 MG capsule Take 1 capsule (100 mg total) by mouth daily as needed for mild constipation. (Patient not taking: Reported on 04/01/2021) 30 capsule 0   oxyCODONE 20 MG TABS Take 0.75 tablets (15 mg total) by mouth every 8 (eight) hours as needed for pain. (Patient not taking: Reported on 04/01/2021) 84 tablet 0   oxyCODONE HCl 15 MG TABA Take 15 mg by mouth every 8 (eight) hours as needed. (Patient not taking: Reported on 04/01/2021) 45  tablet 0   prochlorperazine (COMPAZINE) 10 MG tablet Take 1 tablet (10 mg total) by mouth every 6 (six) hours as needed for nausea or vomiting. (Patient not taking: Reported on 04/01/2021) 30 tablet 3   tadalafil (CIALIS) 10 MG tablet Take 2 tablets (20 mg total) by mouth daily as needed for erectile dysfunction. (Patient not taking: Reported on 04/01/2021) 30 tablet 3   zolpidem (AMBIEN) 10 MG tablet Take 1 tablet (10 mg total) by mouth at bedtime as needed for sleep. (Patient not taking: Reported on 04/01/2021) 30 tablet 2   No current facility-administered medications for this visit.    ALLERGIES:  No Known Allergies  PHYSICAL EXAM:  Performance status (ECOG): 1 - Symptomatic  but completely ambulatory  Vitals:   04/01/21 0922  BP: (!) 155/72  Pulse: 92  Resp: 18  Temp: 98.4 F (36.9 C)  SpO2: 100%   Wt Readings from Last 3 Encounters:  04/01/21 147 lb 0.8 oz (66.7 kg)  03/18/21 143 lb 3.2 oz (65 kg)  03/11/21 145 lb 4.5 oz (65.9 kg)   Physical Exam Vitals reviewed.  Constitutional:      Appearance: Normal appearance.  Cardiovascular:     Rate and Rhythm: Normal rate and regular rhythm.     Pulses: Normal pulses.     Heart sounds: Normal heart sounds.  Pulmonary:     Effort: Pulmonary effort is normal.     Breath sounds: Normal breath sounds.  Neurological:     General: No focal deficit present.     Mental Status: He is alert and oriented to person, place, and time.  Psychiatric:        Mood and Affect: Mood normal.        Behavior: Behavior normal.    LABORATORY DATA:  I have reviewed the labs as listed.  CBC Latest Ref Rng & Units 04/01/2021 03/18/2021 03/11/2021  WBC 4.0 - 10.5 K/uL 5.4 6.8 4.9  Hemoglobin 13.0 - 17.0 g/dL 12.3(L) 12.6(L) 12.1(L)  Hematocrit 39.0 - 52.0 % 36.9(L) 37.5(L) 34.7(L)  Platelets 150 - 400 K/uL 247 390 278   CMP Latest Ref Rng & Units 04/01/2021 03/18/2021 03/11/2021  Glucose 70 - 99 mg/dL 167(H) 130(H) 168(H)  BUN 8 - 23 mg/dL '18  20 16  ' Creatinine 0.61 - 1.24 mg/dL 1.00 0.99 0.90  Sodium 135 - 145 mmol/L 137 135 136  Potassium 3.5 - 5.1 mmol/L 3.6 4.3 3.7  Chloride 98 - 111 mmol/L 107 108 111  CO2 22 - 32 mmol/L 22 18(L) 18(L)  Calcium 8.9 - 10.3 mg/dL 8.9 9.2 8.9  Total Protein 6.5 - 8.1 g/dL 6.7 7.1 6.8  Total Bilirubin 0.3 - 1.2 mg/dL 0.3 0.3 0.2(L)  Alkaline Phos 38 - 126 U/L 77 82 82  AST 15 - 41 U/L '20 20 22  ' ALT 0 - 44 U/L '29 26 27    ' DIAGNOSTIC IMAGING:  I have independently reviewed the scans and discussed with the patient. MR BRAIN WO CONTRAST  Result Date: 03/17/2021 CLINICAL DATA:  Provided history: Metastatic urothelial carcinoma. Altered mental status, nontraumatic. Additional history provided by scanning technologist: Altered mental status during chemotherapy infusion, history of liver and kidney cancer. EXAM: MRI HEAD WITHOUT CONTRAST TECHNIQUE: Multiplanar, multiecho pulse sequences of the brain and surrounding structures were obtained without intravenous contrast. COMPARISON:  None FINDINGS: Brain: Please note evaluation for intracranial metastatic disease is limited in the absence of intravenous contrast. Mild generalized cerebral and cerebellar atrophy. Tiny chronic cortical infarct within the medial left frontal lobe (ACA vascular territory) (series 15, image 39). There are a few small scattered foci of T2 FLAIR hyperintense signal abnormality within the cerebral white matter (most notably within the frontal lobes), nonspecific but compatible chronic small vessel ischemic disease. Small chronic infarct within the left cerebellar hemisphere. There is no acute infarct. No evidence of an intracranial mass. No chronic intracranial blood products. No extra-axial fluid collection. No midline shift. Vascular: Maintained flow voids within the proximal large arterial vessels. Skull and upper cervical spine: No focal suspicious marrow lesion. Sinuses/Orbits: Visualized orbits show no acute finding. Small mucous  retention cyst within the right maxillary sinus. Trace mucosal thickening within the bilateral ethmoid sinuses. IMPRESSION: Please note, there is  limited assessment for intracranial metastatic disease in the absence of intravenous contrast. No evidence of acute intracranial abnormality. Tiny chronic cortical infarct within the medial left frontal lobe (ACA vascular territory). Mild chronic small vessel ischemic changes within the cerebral white matter. Small chronic infarct within the left cerebellar hemisphere. Mild generalized parenchymal atrophy. Mild paranasal sinus disease, as described. Electronically Signed   By: Kellie Simmering D.O.   On: 03/17/2021 07:59     ASSESSMENT:  1. 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 - 20 pound weight loss in the last 4 months due to decreased appetite.  Left upper quadrant pain for the past 2 to 3 months. - CTAP with contrast on 07/10/2020 showed ill-defined low-density mass in the right hepatic lobe measuring 2 x 2 cm.  Left upper pole kidney mass measuring 7.6 x 5.9 cm with encasement and narrowing of the left renal artery and vein.  No intravascular thrombus noted.  Mass encases the left adrenal gland.  Small right adrenal nodule measuring 11 mm, likely benign.  No other evidence of metastatic disease. - MRI of the liver with and without contrast on 07/20/2020 shows 2.2 x 1.7 cm enhancing lesion in the medial aspect of the segment 7, suspicious for metastasis.  Left renal mass, 6 x 7.6 x 6.9 cm infiltrating enhancing left upper pole renal mass, extending into the left renal sinus.  Possible TCC or lymphoma rather than renal cell carcinoma. - CT chest on 07/20/2020 with no evidence of metastatic disease. - PET scan on 08/04/2020 showed large hypermetabolic left upper pole mass and 2 hypermetabolic lesions in the right hepatic lobe favoring metastatic disease.   No hypermetabolic adenopathy.  - Liver biopsy showed poorly differentiated metastatic urothelial carcinoma.  IHC positive for CK5/6, CK7, GATA3, p40 and patchy positivity with p63 and PAX8. - He refused any chemotherapy. - 4 cycles of pembrolizumab from 08/20/2020 through 10/22/2020 with progression. - NGS testing showed benefit from pembrolizumab.  No mutations involving FGFR, NTRK was found.  Test was limited due to small sample. - CT CAP on 11/11/2020 showed interval progression of liver metastasis.  No significant change infiltrative mass arising in the upper pole of the left kidney invading the medial aspect of the upper spleen, left renal vein and left adrenal gland with soft tissue infiltration to the left retroperitoneal fat.  No evidence of metastatic disease to the chest. - Progression on 3 cycles of enfortumab vedotin. - CT CAP on 02/15/2021 showed decrease in size of the left renal mass.  Interval progression of numerous liver metastasis. - Guardant 360 results-KRAS G12D, PDGFRA T230T, DDR2 [id number], MSI high not detected.   2.  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 - Reports exposure to carbon black dust.  Smoked half pack per day for 30 years, currently smoking 2 to 3 cigarettes/day. - No family history of malignancy.   PLAN:  1. Metastatic poorly differentiated urothelial carcinoma arising in the renal pelvis: - He has completed 2 cycles of sacituzumab govitecan. - He had episodes of confusion at last cycle. - We have reviewed MRI of the brain from 03/17/2021 which did not show any evidence of intracranial metastatic disease.  This was done without contrast.  Chronic infarcts were seen. - Reviewed his labs today which showed normal CBC and LFTs.  His mental status has normalized. - We discussed  guardant 360 results which did not show any FGF 4 mutation. - He will proceed with cycle 3 of sacituzumab govitecan. - RTC 3 weeks for  follow-up with labs and treatment.  We will plan to repeat scans after 4 cycles.     2.  Left upper quadrant pain: - He reports pain has improved. - He is taking oxycodone 20 mg 1 to 2 tablets daily as needed.  He is also using ibuprofen as needed in between.  3.  Constipation: - Continue senna 2 tablets at bedtime. - Continue mag citrate as needed.  4.  Normocytic to microcytic anemia: - His hemoglobin is 12.3.  No need for iron infusion.  5.  Loss of appetite: - He is not requiring Megace.  He has good appetite and gained couple of pounds.  6.  Anxiety: - He is using Xanax 0.5 mg tablets.  Anxiety is poorly controlled. - Will increase Xanax to 1 mg at bedtime as needed.   Orders placed this encounter:  No orders of the defined types were placed in this encounter.    Jeremy Jack, MD [facility] [phone]   I, Thana Ates, am acting as a scribe for Dr. Derek Rodriguez.  I, Jeremy Jack MD, have reviewed the above documentation for accuracy and completeness, and I agree with the above.

## 2021-04-01 ENCOUNTER — Ambulatory Visit (HOSPITAL_COMMUNITY): Payer: Medicare Other | Admitting: Dietician

## 2021-04-01 ENCOUNTER — Inpatient Hospital Stay (HOSPITAL_COMMUNITY): Payer: Medicare Other

## 2021-04-01 ENCOUNTER — Inpatient Hospital Stay (HOSPITAL_BASED_OUTPATIENT_CLINIC_OR_DEPARTMENT_OTHER): Payer: Medicare Other | Admitting: Hematology

## 2021-04-01 ENCOUNTER — Other Ambulatory Visit: Payer: Self-pay

## 2021-04-01 VITALS — BP 142/71 | HR 87 | Temp 98.4°F | Resp 18

## 2021-04-01 VITALS — BP 155/72 | HR 92 | Temp 98.4°F | Resp 18 | Wt 147.0 lb

## 2021-04-01 DIAGNOSIS — C679 Malignant neoplasm of bladder, unspecified: Secondary | ICD-10-CM | POA: Diagnosis not present

## 2021-04-01 DIAGNOSIS — C652 Malignant neoplasm of left renal pelvis: Secondary | ICD-10-CM | POA: Diagnosis not present

## 2021-04-01 DIAGNOSIS — C7910 Secondary malignant neoplasm of unspecified urinary organs: Secondary | ICD-10-CM

## 2021-04-01 DIAGNOSIS — D509 Iron deficiency anemia, unspecified: Secondary | ICD-10-CM | POA: Diagnosis not present

## 2021-04-01 LAB — CBC WITH DIFFERENTIAL/PLATELET
Abs Immature Granulocytes: 0.04 10*3/uL (ref 0.00–0.07)
Basophils Absolute: 0 10*3/uL (ref 0.0–0.1)
Basophils Relative: 0 %
Eosinophils Absolute: 0.1 10*3/uL (ref 0.0–0.5)
Eosinophils Relative: 2 %
HCT: 36.9 % — ABNORMAL LOW (ref 39.0–52.0)
Hemoglobin: 12.3 g/dL — ABNORMAL LOW (ref 13.0–17.0)
Immature Granulocytes: 1 %
Lymphocytes Relative: 22 %
Lymphs Abs: 1.2 10*3/uL (ref 0.7–4.0)
MCH: 29.4 pg (ref 26.0–34.0)
MCHC: 33.3 g/dL (ref 30.0–36.0)
MCV: 88.3 fL (ref 80.0–100.0)
Monocytes Absolute: 0.7 10*3/uL (ref 0.1–1.0)
Monocytes Relative: 14 %
Neutro Abs: 3.3 10*3/uL (ref 1.7–7.7)
Neutrophils Relative %: 61 %
Platelets: 247 10*3/uL (ref 150–400)
RBC: 4.18 MIL/uL — ABNORMAL LOW (ref 4.22–5.81)
RDW: 15.3 % (ref 11.5–15.5)
WBC: 5.4 10*3/uL (ref 4.0–10.5)
nRBC: 0 % (ref 0.0–0.2)

## 2021-04-01 LAB — PHOSPHORUS: Phosphorus: 3.6 mg/dL (ref 2.5–4.6)

## 2021-04-01 LAB — COMPREHENSIVE METABOLIC PANEL
ALT: 29 U/L (ref 0–44)
AST: 20 U/L (ref 15–41)
Albumin: 3.6 g/dL (ref 3.5–5.0)
Alkaline Phosphatase: 77 U/L (ref 38–126)
Anion gap: 8 (ref 5–15)
BUN: 18 mg/dL (ref 8–23)
CO2: 22 mmol/L (ref 22–32)
Calcium: 8.9 mg/dL (ref 8.9–10.3)
Chloride: 107 mmol/L (ref 98–111)
Creatinine, Ser: 1 mg/dL (ref 0.61–1.24)
GFR, Estimated: >60 mL/min (ref >60)
Glucose, Bld: 167 mg/dL — ABNORMAL HIGH (ref 70–99)
Potassium: 3.6 mmol/L (ref 3.5–5.1)
Sodium: 137 mmol/L (ref 135–145)
Total Bilirubin: 0.3 mg/dL (ref 0.3–1.2)
Total Protein: 6.7 g/dL (ref 6.5–8.1)

## 2021-04-01 LAB — MAGNESIUM: Magnesium: 2 mg/dL (ref 1.7–2.4)

## 2021-04-01 MED ORDER — FAMOTIDINE 20 MG IN NS 100 ML IVPB: 20.0000 mg | Freq: Once | INTRAVENOUS | Status: DC

## 2021-04-01 MED ORDER — ACETAMINOPHEN 325 MG PO TABS
650.0000 mg | ORAL_TABLET | Freq: Once | ORAL | Status: AC
  Administered 2021-04-01: 11:00:00 650 mg via ORAL
  Filled 2021-04-01: qty 2

## 2021-04-01 MED ORDER — FAMOTIDINE IN NACL 20-0.9 MG/50ML-% IV SOLN
20.0000 mg | Freq: Once | INTRAVENOUS | Status: AC
  Administered 2021-04-01: 12:00:00 20 mg via INTRAVENOUS
  Filled 2021-04-01: qty 50

## 2021-04-01 MED ORDER — SODIUM CHLORIDE 0.9 % IV SOLN
10.0000 mg | Freq: Once | INTRAVENOUS | Status: AC
  Administered 2021-04-01: 11:00:00 10 mg via INTRAVENOUS
  Filled 2021-04-01: qty 10

## 2021-04-01 MED ORDER — SODIUM CHLORIDE 0.9 % IV SOLN
150.0000 mg | Freq: Once | INTRAVENOUS | Status: AC
  Administered 2021-04-01: 11:00:00 150 mg via INTRAVENOUS
  Filled 2021-04-01: qty 150

## 2021-04-01 MED ORDER — ALPRAZOLAM 1 MG PO TABS
0.5000 mg | ORAL_TABLET | Freq: Every evening | ORAL | 1 refills | Status: DC | PRN
Start: 2021-04-01 — End: 2021-04-06

## 2021-04-01 MED ORDER — SODIUM CHLORIDE 0.9 % IV SOLN
10.0000 mg/kg | Freq: Once | INTRAVENOUS | Status: AC
  Administered 2021-04-01: 12:00:00 670 mg via INTRAVENOUS
  Filled 2021-04-01: qty 67

## 2021-04-01 MED ORDER — SODIUM CHLORIDE 0.9 % IV SOLN: Freq: Once | INTRAVENOUS | Status: AC

## 2021-04-01 MED ORDER — PALONOSETRON HCL INJECTION 0.25 MG/5ML
0.2500 mg | Freq: Once | INTRAVENOUS | Status: AC
  Administered 2021-04-01: 11:00:00 0.25 mg via INTRAVENOUS
  Filled 2021-04-01: qty 5

## 2021-04-01 NOTE — Progress Notes (Signed)
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 He is currently receiving chemotherapy with Trodelvy q21d.   Past medical history includes CKD, GERD  Met with patient during infusion after he requested oral nutrition supplement samples during RD visit with another patient in infusion. Patient reports prior poor appetite and altered taste, states he lost 55 lbs in 3 weeks but this has improved. Weight trends do not reflect patient report per chart. Patient reports appetite has been "too good" recently. Patient reports he is no longer taking appetite stimulant (megace). He reports eating 4-5 small meals/day. Patient enjoys cooking, recalls making lots of soups. Patient reports drinking half gallon of water day. He also drinks tonic water, soda, and fairlife milk. Patient reports fairlife helped him to gain weight. He has asked for additional ONS to try. Patient denies nausea, vomiting, diarrhea, constipation.  Nutrition Focused Physical Exam: deferred   Medications: xanax, bentyl, colace, lactulose, megace, protonix, compazine   Labs: glucose 167   Anthropometrics: Weights have increased from 145 lb 4.5 oz on 12/8  Patient weights have decreased 8% (13 lbs) from usual weight in 9 months; insignificant for time frame.   Height: 5'5" Weight: 147 lb 0.8 oz today  UBW: 160 lb (06/10/20) BMI: 24.47   NUTRITION DIAGNOSIS: Food and nutrition related knowledge deficit related to cancer and associated treatments as evidenced by no prior need for related nutrition information    INTERVENTION:  Encouraged eating small frequent meals with adequate calories and protein  Suggested mixing fairlife milk with CIB powder for daily ONS - sample of CIB powder as well as Ensure Complete provided for pt to try Discussed strategies for altered taste - handout provided  Contact information provided   MONITORING, EVALUATION,  GOAL: weight trends, intake    Next Visit: To be scheduled as needed with treatment

## 2021-04-01 NOTE — Patient Instructions (Signed)
[place] at [facility] Discharge Instructions   You were seen and examined today by Dr. Delton Coombes. He reviewed your lab work with you which was normal/stable. We will proceed with your treatment today.  Return as scheduled for lab work and treatment.    Thank you for choosing [place] at [facility] to provide your oncology and hematology care.  To afford each patient quality time with our provider, please arrive at least 15 minutes before your scheduled appointment time.   If you have a lab appointment with the [facility] please come in thru the Main Entrance and check in at the main information desk.  You need to re-schedule your appointment should you arrive 10 or more minutes late.  We strive to give you quality time with our providers, and arriving late affects you and other patients whose appointments are after yours.  Also, if you no show three or more times for appointments you may be dismissed from the clinic at the providers discretion.     Again, thank you for choosing [facility].  Our hope is that these requests will decrease the amount of time that you wait before being seen by our physicians.       _____________________________________________________________  Should you have questions after your visit to [facility], please contact our office at [phone] and follow the prompts.  Our office hours are 8:00 a.m. and 4:30 p.m. Monday - Friday.  Please note that voicemails left after 4:00 p.m. may not be returned until the following business day.  We are closed weekends and major holidays.  You do have access to a nurse 24-7, just call the main number to the clinic [phone] and do not press any options, hold on the line and a nurse will answer the phone.    For prescription refill requests, have your pharmacy contact our office and allow 72 hours.    Due to Covid, you will need to wear a mask  upon entering the hospital. If you do not have a mask, a mask will be given to you at the Main Entrance upon arrival. For doctor visits, patients may have 1 support person age 43 or older with them. For treatment visits, patients can not have anyone with them due to social distancing guidelines and our immunocompromised population.

## 2021-04-01 NOTE — Patient Instructions (Signed)
[facility]  Discharge Instructions: Thank you for choosing [facility] to provide your oncology and hematology care.  If you have a lab appointment with the [facility], please come in thru the Main Entrance and check in at the main information desk.  Wear comfortable clothing and clothing appropriate for easy access to any Portacath or PICC line.   We strive to give you quality time with your provider. You may need to reschedule your appointment if you arrive late (15 or more minutes).  Arriving late affects you and other patients whose appointments are after yours.  Also, if you miss three or more appointments without notifying the office, you may be dismissed from the clinic at the providers discretion.      For prescription refill requests, have your pharmacy contact our office and allow 72 hours for refills to be completed.    Today you received the following chemotherapy and/or immunotherapy agents Trodelvy .       To help prevent nausea and vomiting after your treatment, we encourage you to take your nausea medication as directed.  BELOW ARE SYMPTOMS THAT SHOULD BE REPORTED IMMEDIATELY: *FEVER GREATER THAN 100.4 F (38 C) OR HIGHER *CHILLS OR SWEATING *NAUSEA AND VOMITING THAT IS NOT CONTROLLED WITH YOUR NAUSEA MEDICATION *UNUSUAL SHORTNESS OF BREATH *UNUSUAL BRUISING OR BLEEDING *URINARY PROBLEMS (pain or burning when urinating, or frequent urination) *BOWEL PROBLEMS (unusual diarrhea, constipation, pain near the anus) TENDERNESS IN MOUTH AND THROAT WITH OR WITHOUT PRESENCE OF ULCERS (sore throat, sores in mouth, or a toothache) UNUSUAL RASH, SWELLING OR PAIN  UNUSUAL VAGINAL DISCHARGE OR ITCHING   Items with * indicate a potential emergency and should be followed up as soon as possible or go to the Emergency Department if any problems should occur.  Please show the CHEMOTHERAPY ALERT CARD or IMMUNOTHERAPY ALERT CARD at check-in to the Emergency  Department and triage nurse.  Should you have questions after your visit or need to cancel or reschedule your appointment, please contact [facility] [phone]  and follow the prompts.  Office hours are 8:00 a.m. to 4:30 p.m. Monday - Friday. Please note that voicemails left after 4:00 p.m. may not be returned until the following business day.  We are closed weekends and major holidays. You have access to a nurse at all times for urgent questions. Please call the main number to the clinic [phone] and follow the prompts.  For any non-urgent questions, you may also contact your provider using MyChart. We now offer e-Visits for anyone 33 and older to request care online for non-urgent symptoms. For details visit [email or web address].   Also download the MyChart app! Go to the app store, search "MyChart", open the app, select [place], and log in with your MyChart username and password.  Due to Covid, a mask is required upon entering the hospital/clinic. If you do not have a mask, one will be given to you upon arrival. For doctor visits, patients may have 1 support person aged 61 or older with them. For treatment visits, patients cannot have anyone with them due to current Covid guidelines and our immunocompromised population.

## 2021-04-01 NOTE — Progress Notes (Signed)
Message received from A Anderson RN / Dr. Delton Coombes to proceed with treatment. Labs reviewed. Vital signs reviewed.   Treatment given today per MD orders. Tolerated infusion without adverse affects. Vital signs stable. No complaints at this time. Discharged from clinic ambulatory in stable condition. Alert and oriented x 3. F/U with [facility] as scheduled.

## 2021-04-01 NOTE — Progress Notes (Signed)
Patient has been examined by Dr. Katragadda, and vital signs and labs have been reviewed. ANC, Creatinine, LFTs, hemoglobin, and platelets are within treatment parameters per M.D. - pt may proceed with treatment.    °

## 2021-04-02 ENCOUNTER — Encounter (HOSPITAL_COMMUNITY): Payer: Self-pay | Admitting: Hematology

## 2021-04-05 ENCOUNTER — Encounter (HOSPITAL_COMMUNITY): Payer: Self-pay

## 2021-04-06 ENCOUNTER — Other Ambulatory Visit (HOSPITAL_COMMUNITY): Payer: Self-pay | Admitting: *Deleted

## 2021-04-06 MED ORDER — ALPRAZOLAM 1 MG PO TABS: 1.0000 mg | ORAL_TABLET | Freq: Every evening | ORAL | 3 refills | Status: DC | PRN

## 2021-04-07 ENCOUNTER — Other Ambulatory Visit (HOSPITAL_COMMUNITY): Payer: Self-pay | Admitting: *Deleted

## 2021-04-07 MED ORDER — ALPRAZOLAM 1 MG PO TABS: 1.0000 mg | ORAL_TABLET | Freq: Two times a day (BID) | ORAL | 3 refills | Status: DC

## 2021-04-08 ENCOUNTER — Inpatient Hospital Stay (HOSPITAL_COMMUNITY): Payer: Medicare Other | Attending: Hematology

## 2021-04-08 ENCOUNTER — Other Ambulatory Visit: Payer: Self-pay

## 2021-04-08 ENCOUNTER — Inpatient Hospital Stay (HOSPITAL_COMMUNITY): Payer: Medicare Other

## 2021-04-08 VITALS — BP 133/68 | HR 85 | Temp 98.3°F | Resp 18 | Ht 65.0 in | Wt 146.6 lb

## 2021-04-08 DIAGNOSIS — C7902 Secondary malignant neoplasm of left kidney and renal pelvis: Secondary | ICD-10-CM | POA: Insufficient documentation

## 2021-04-08 DIAGNOSIS — C7910 Secondary malignant neoplasm of unspecified urinary organs: Secondary | ICD-10-CM

## 2021-04-08 DIAGNOSIS — C679 Malignant neoplasm of bladder, unspecified: Secondary | ICD-10-CM | POA: Diagnosis not present

## 2021-04-08 LAB — COMPREHENSIVE METABOLIC PANEL
ALT: 29 U/L (ref 0–44)
AST: 23 U/L (ref 15–41)
Albumin: 3.5 g/dL (ref 3.5–5.0)
Alkaline Phosphatase: 73 U/L (ref 38–126)
Anion gap: 8 (ref 5–15)
BUN: 18 mg/dL (ref 8–23)
CO2: 21 mmol/L — ABNORMAL LOW (ref 22–32)
Calcium: 8.8 mg/dL — ABNORMAL LOW (ref 8.9–10.3)
Chloride: 107 mmol/L (ref 98–111)
Creatinine, Ser: 1.06 mg/dL (ref 0.61–1.24)
GFR, Estimated: >60 mL/min (ref >60)
Glucose, Bld: 145 mg/dL — ABNORMAL HIGH (ref 70–99)
Potassium: 4 mmol/L (ref 3.5–5.1)
Sodium: 136 mmol/L (ref 135–145)
Total Bilirubin: 0.5 mg/dL (ref 0.3–1.2)
Total Protein: 6.7 g/dL (ref 6.5–8.1)

## 2021-04-08 LAB — CBC WITH DIFFERENTIAL/PLATELET
Abs Immature Granulocytes: 0.05 10*3/uL (ref 0.00–0.07)
Basophils Absolute: 0 10*3/uL (ref 0.0–0.1)
Basophils Relative: 1 %
Eosinophils Absolute: 0.1 10*3/uL (ref 0.0–0.5)
Eosinophils Relative: 2 %
HCT: 35.2 % — ABNORMAL LOW (ref 39.0–52.0)
Hemoglobin: 11.9 g/dL — ABNORMAL LOW (ref 13.0–17.0)
Immature Granulocytes: 1 %
Lymphocytes Relative: 25 %
Lymphs Abs: 1.1 10*3/uL (ref 0.7–4.0)
MCH: 30.1 pg (ref 26.0–34.0)
MCHC: 33.8 g/dL (ref 30.0–36.0)
MCV: 89.1 fL (ref 80.0–100.0)
Monocytes Absolute: 0.7 10*3/uL (ref 0.1–1.0)
Monocytes Relative: 16 %
Neutro Abs: 2.4 10*3/uL (ref 1.7–7.7)
Neutrophils Relative %: 55 %
Platelets: 294 10*3/uL (ref 150–400)
RBC: 3.95 MIL/uL — ABNORMAL LOW (ref 4.22–5.81)
RDW: 15.3 % (ref 11.5–15.5)
WBC: 4.3 10*3/uL (ref 4.0–10.5)
nRBC: 0 % (ref 0.0–0.2)

## 2021-04-08 LAB — PHOSPHORUS: Phosphorus: 3.3 mg/dL (ref 2.5–4.6)

## 2021-04-08 LAB — MAGNESIUM: Magnesium: 2.1 mg/dL (ref 1.7–2.4)

## 2021-04-08 MED ORDER — FAMOTIDINE IN NACL 20-0.9 MG/50ML-% IV SOLN
20.0000 mg | Freq: Once | INTRAVENOUS | Status: AC
  Administered 2021-04-08: 20 mg via INTRAVENOUS
  Filled 2021-04-08: qty 50

## 2021-04-08 MED ORDER — FAMOTIDINE 20 MG IN NS 100 ML IVPB: 20.0000 mg | Freq: Once | INTRAVENOUS | Status: DC

## 2021-04-08 MED ORDER — PALONOSETRON HCL INJECTION 0.25 MG/5ML
0.2500 mg | Freq: Once | INTRAVENOUS | Status: AC
  Administered 2021-04-08: 0.25 mg via INTRAVENOUS
  Filled 2021-04-08: qty 5

## 2021-04-08 MED ORDER — SODIUM CHLORIDE 0.9 % IV SOLN
150.0000 mg | Freq: Once | INTRAVENOUS | Status: AC
  Administered 2021-04-08: 150 mg via INTRAVENOUS
  Filled 2021-04-08: qty 5

## 2021-04-08 MED ORDER — ACETAMINOPHEN 325 MG PO TABS
650.0000 mg | ORAL_TABLET | Freq: Once | ORAL | Status: AC
  Administered 2021-04-08: 650 mg via ORAL
  Filled 2021-04-08: qty 2

## 2021-04-08 MED ORDER — SODIUM CHLORIDE 0.9 % IV SOLN
10.0000 mg/kg | Freq: Once | INTRAVENOUS | Status: AC
  Administered 2021-04-08: 670 mg via INTRAVENOUS
  Filled 2021-04-08: qty 67

## 2021-04-08 MED ORDER — SODIUM CHLORIDE 0.9 % IV SOLN
10.0000 mg | Freq: Once | INTRAVENOUS | Status: AC
  Administered 2021-04-08: 10 mg via INTRAVENOUS
  Filled 2021-04-08: qty 10

## 2021-04-08 MED ORDER — SODIUM CHLORIDE 0.9 % IV SOLN: Freq: Once | INTRAVENOUS | Status: AC

## 2021-04-08 NOTE — Patient Instructions (Signed)
[facility]  Discharge Instructions: Thank you for choosing [facility] to provide your oncology and hematology care.  If you have a lab appointment with the Cancer Center, please come in thru the Main Entrance and check in at the main information desk.  Wear comfortable clothing and clothing appropriate for easy access to any Portacath or PICC line.   We strive to give you quality time with your provider. You may need to reschedule your appointment if you arrive late (15 or more minutes).  Arriving late affects you and other patients whose appointments are after yours.  Also, if you miss three or more appointments without notifying the office, you may be dismissed from the clinic at the provider's discretion.      For prescription refill requests, have your pharmacy contact our office and allow 72 hours for refills to be completed.        To help prevent nausea and vomiting after your treatment, we encourage you to take your nausea medication as directed.  BELOW ARE SYMPTOMS THAT SHOULD BE REPORTED IMMEDIATELY: *FEVER GREATER THAN 100.4 F (38 C) OR HIGHER *CHILLS OR SWEATING *NAUSEA AND VOMITING THAT IS NOT CONTROLLED WITH YOUR NAUSEA MEDICATION *UNUSUAL SHORTNESS OF BREATH *UNUSUAL BRUISING OR BLEEDING *URINARY PROBLEMS (pain or burning when urinating, or frequent urination) *BOWEL PROBLEMS (unusual diarrhea, constipation, pain near the anus) TENDERNESS IN MOUTH AND THROAT WITH OR WITHOUT PRESENCE OF ULCERS (sore throat, sores in mouth, or a toothache) UNUSUAL RASH, SWELLING OR PAIN  UNUSUAL VAGINAL DISCHARGE OR ITCHING   Items with * indicate a potential emergency and should be followed up as soon as possible or go to the Emergency Department if any problems should occur.  Please show the CHEMOTHERAPY ALERT CARD or IMMUNOTHERAPY ALERT CARD at check-in to the Emergency Department and triage nurse.  Should you have questions after your visit or need to cancel  or reschedule your appointment, please contact [facility] [phone]  and follow the prompts.  Office hours are 8:00 a.m. to 4:30 p.m. Monday - Friday. Please note that voicemails left after 4:00 p.m. may not be returned until the following business day.  We are closed weekends and major holidays. You have access to a nurse at all times for urgent questions. Please call the main number to the clinic [phone] and follow the prompts.  For any non-urgent questions, you may also contact your provider using MyChart. We now offer e-Visits for anyone 18 and older to request care online for non-urgent symptoms. For details visit [email or web address].   Also download the MyChart app! Go to the app store, search "MyChart", open the app, select [facility], and log in with your MyChart username and password.  Due to Covid, a mask is required upon entering the hospital/clinic. If you do not have a mask, one will be given to you upon arrival. For doctor visits, patients may have 1 support person aged 18 or older with them. For treatment visits, patients cannot have anyone with them due to current Covid guidelines and our immunocompromised population.  

## 2021-04-08 NOTE — Progress Notes (Signed)
Patient presents today for chemotherapy infusion.  Patient is in satisfactory condition with no new complaints voiced.  Vital signs are stable.  Labs reviewed and all labs are within treatment parameters. We will proceed with treatment per MD orders.   Patient tolerated treatment well with no complaints voiced.  Patient left ambulatory in stable condition.  Vital signs stable at discharge.  Follow up as scheduled.    

## 2021-04-22 ENCOUNTER — Inpatient Hospital Stay (HOSPITAL_COMMUNITY): Payer: Medicare Other

## 2021-04-22 ENCOUNTER — Inpatient Hospital Stay (HOSPITAL_BASED_OUTPATIENT_CLINIC_OR_DEPARTMENT_OTHER): Payer: Medicare Other | Admitting: Hematology

## 2021-04-22 ENCOUNTER — Other Ambulatory Visit: Payer: Self-pay

## 2021-04-22 VITALS — BP 160/89 | HR 90 | Temp 98.1°F | Resp 18 | Ht 65.0 in | Wt 152.0 lb

## 2021-04-22 VITALS — BP 152/77 | HR 86 | Temp 98.5°F | Resp 18

## 2021-04-22 DIAGNOSIS — C7910 Secondary malignant neoplasm of unspecified urinary organs: Secondary | ICD-10-CM

## 2021-04-22 DIAGNOSIS — C679 Malignant neoplasm of bladder, unspecified: Secondary | ICD-10-CM | POA: Diagnosis not present

## 2021-04-22 DIAGNOSIS — C7902 Secondary malignant neoplasm of left kidney and renal pelvis: Secondary | ICD-10-CM | POA: Diagnosis not present

## 2021-04-22 LAB — CBC WITH DIFFERENTIAL/PLATELET
Abs Immature Granulocytes: 0.02 10*3/uL (ref 0.00–0.07)
Basophils Absolute: 0 10*3/uL (ref 0.0–0.1)
Basophils Relative: 1 %
Eosinophils Absolute: 0.1 10*3/uL (ref 0.0–0.5)
Eosinophils Relative: 2 %
HCT: 35.7 % — ABNORMAL LOW (ref 39.0–52.0)
Hemoglobin: 11.7 g/dL — ABNORMAL LOW (ref 13.0–17.0)
Immature Granulocytes: 0 %
Lymphocytes Relative: 25 %
Lymphs Abs: 1.2 10*3/uL (ref 0.7–4.0)
MCH: 29.2 pg (ref 26.0–34.0)
MCHC: 32.8 g/dL (ref 30.0–36.0)
MCV: 89 fL (ref 80.0–100.0)
Monocytes Absolute: 0.7 10*3/uL (ref 0.1–1.0)
Monocytes Relative: 14 %
Neutro Abs: 2.8 10*3/uL (ref 1.7–7.7)
Neutrophils Relative %: 58 %
Platelets: 262 10*3/uL (ref 150–400)
RBC: 4.01 MIL/uL — ABNORMAL LOW (ref 4.22–5.81)
RDW: 15.9 % — ABNORMAL HIGH (ref 11.5–15.5)
WBC: 4.8 10*3/uL (ref 4.0–10.5)
nRBC: 0 % (ref 0.0–0.2)

## 2021-04-22 LAB — COMPREHENSIVE METABOLIC PANEL
ALT: 28 U/L (ref 0–44)
AST: 22 U/L (ref 15–41)
Albumin: 3.5 g/dL (ref 3.5–5.0)
Alkaline Phosphatase: 81 U/L (ref 38–126)
Anion gap: 7 (ref 5–15)
BUN: 14 mg/dL (ref 8–23)
CO2: 21 mmol/L — ABNORMAL LOW (ref 22–32)
Calcium: 8.8 mg/dL — ABNORMAL LOW (ref 8.9–10.3)
Chloride: 109 mmol/L (ref 98–111)
Creatinine, Ser: 0.99 mg/dL (ref 0.61–1.24)
GFR, Estimated: >60 mL/min (ref >60)
Glucose, Bld: 132 mg/dL — ABNORMAL HIGH (ref 70–99)
Potassium: 3.8 mmol/L (ref 3.5–5.1)
Sodium: 137 mmol/L (ref 135–145)
Total Bilirubin: 0.4 mg/dL (ref 0.3–1.2)
Total Protein: 6.7 g/dL (ref 6.5–8.1)

## 2021-04-22 LAB — PHOSPHORUS: Phosphorus: 3.9 mg/dL (ref 2.5–4.6)

## 2021-04-22 LAB — MAGNESIUM: Magnesium: 2 mg/dL (ref 1.7–2.4)

## 2021-04-22 MED ORDER — FAMOTIDINE IN NACL 20-0.9 MG/50ML-% IV SOLN
20.0000 mg | Freq: Once | INTRAVENOUS | Status: AC
  Administered 2021-04-22: 20 mg via INTRAVENOUS
  Filled 2021-04-22: qty 50

## 2021-04-22 MED ORDER — PALONOSETRON HCL INJECTION 0.25 MG/5ML
0.2500 mg | Freq: Once | INTRAVENOUS | Status: AC
  Administered 2021-04-22: 0.25 mg via INTRAVENOUS
  Filled 2021-04-22: qty 5

## 2021-04-22 MED ORDER — SODIUM CHLORIDE 0.9 % IV SOLN
10.0000 mg/kg | Freq: Once | INTRAVENOUS | Status: AC
  Administered 2021-04-22: 670 mg via INTRAVENOUS
  Filled 2021-04-22: qty 67

## 2021-04-22 MED ORDER — SODIUM CHLORIDE 0.9 % IV SOLN: Freq: Once | INTRAVENOUS | Status: AC

## 2021-04-22 MED ORDER — SODIUM CHLORIDE 0.9 % IV SOLN
10.0000 mg | Freq: Once | INTRAVENOUS | Status: AC
  Administered 2021-04-22: 10 mg via INTRAVENOUS
  Filled 2021-04-22: qty 10

## 2021-04-22 MED ORDER — ACETAMINOPHEN 325 MG PO TABS
650.0000 mg | ORAL_TABLET | Freq: Once | ORAL | Status: AC
  Administered 2021-04-22: 650 mg via ORAL
  Filled 2021-04-22: qty 2

## 2021-04-22 MED ORDER — SODIUM CHLORIDE 0.9 % IV SOLN
150.0000 mg | Freq: Once | INTRAVENOUS | Status: AC
  Administered 2021-04-22: 150 mg via INTRAVENOUS
  Filled 2021-04-22: qty 150

## 2021-04-22 NOTE — Patient Instructions (Signed)
[facility]  Discharge Instructions: Thank you for choosing [facility] to provide your oncology and hematology care.  If you have a lab appointment with the [facility], please come in thru the Main Entrance and check in at the main information desk.  Wear comfortable clothing and clothing appropriate for easy access to any Portacath or PICC line.   We strive to give you quality time with your provider. You may need to reschedule your appointment if you arrive late (15 or more minutes).  Arriving late affects you and other patients whose appointments are after yours.  Also, if you miss three or more appointments without notifying the office, you may be dismissed from the clinic at the providers discretion.      For prescription refill requests, have your pharmacy contact our office and allow 72 hours for refills to be completed.    Today you received the following chemotherapy and/or immunotherapy agents Ivette Loyal   To help prevent nausea and vomiting after your treatment, we encourage you to take your nausea medication as directed.  BELOW ARE SYMPTOMS THAT SHOULD BE REPORTED IMMEDIATELY: *FEVER GREATER THAN 100.4 F (38 C) OR HIGHER *CHILLS OR SWEATING *NAUSEA AND VOMITING THAT IS NOT CONTROLLED WITH YOUR NAUSEA MEDICATION *UNUSUAL SHORTNESS OF BREATH *UNUSUAL BRUISING OR BLEEDING *URINARY PROBLEMS (pain or burning when urinating, or frequent urination) *BOWEL PROBLEMS (unusual diarrhea, constipation, pain near the anus) TENDERNESS IN MOUTH AND THROAT WITH OR WITHOUT PRESENCE OF ULCERS (sore throat, sores in mouth, or a toothache) UNUSUAL RASH, SWELLING OR PAIN  UNUSUAL VAGINAL DISCHARGE OR ITCHING   Items with * indicate a potential emergency and should be followed up as soon as possible or go to the Emergency Department if any problems should occur.  Please show the CHEMOTHERAPY ALERT CARD or IMMUNOTHERAPY ALERT CARD at check-in to the Emergency Department  and triage nurse.  Should you have questions after your visit or need to cancel or reschedule your appointment, please contact [facility] [phone]  and follow the prompts.  Office hours are 8:00 a.m. to 4:30 p.m. Monday - Friday. Please note that voicemails left after 4:00 p.m. may not be returned until the following business day.  We are closed weekends and major holidays. You have access to a nurse at all times for urgent questions. Please call the main number to the clinic [phone] and follow the prompts.  For any non-urgent questions, you may also contact your provider using MyChart. We now offer e-Visits for anyone 24 and older to request care online for non-urgent symptoms. For details visit [email or web address].   Also download the MyChart app! Go to the app store, search "MyChart", open the app, select [facility], and log in with your MyChart username and password.  Due to Covid, a mask is required upon entering the hospital/clinic. If you do not have a mask, one will be given to you upon arrival. For doctor visits, patients may have 1 support person aged 36 or older with them. For treatment visits, patients cannot have anyone with them due to current Covid guidelines and our immunocompromised population.

## 2021-04-22 NOTE — Progress Notes (Signed)
[facility] [address], [place] [address]   CLINIC:  Medical Oncology/Hematology  PCP:  Lindell Spar, MD [address] / [place] [address] [phone]   REASON FOR VISIT:  Follow-up for metastatic urothelial carcinoma  PRIOR THERAPY:  Keytruda every 3 weeks Enfortumab every 4 weeks  NGS Results: not done  CURRENT THERAPY: Sacituzumab govitecan-hziy [name]  BRIEF ONCOLOGIC HISTORY:  Oncology History  Metastatic urothelial carcinoma ([place])  08/13/2020 Initial Diagnosis   Metastatic urothelial carcinoma ([place])   08/13/2020 Cancer Staging   Staging form: Urinary Bladder, AJCC 8th Edition - Clinical stage from 08/13/2020: Stage IVB (cTX, cN0, pM1b) - Signed by Derek Jack, MD on 08/13/2020 Stage prefix: Initial diagnosis WHO/ISUP grade (low/high): High Grade Histologic grading system: 2 grade system    08/20/2020 - 10/22/2020 Chemotherapy          11/19/2020 - 01/28/2021 Chemotherapy   Patient is on Treatment Plan : UROTHELIAL LOCALLY ADVANCED / METASTATIC Enfortumab q28d     02/15/2021 -  Chemotherapy   Patient is on Treatment Plan : BLADDER Sacituzumab govitecan-hziy Ivette Loyal) q21d       CANCER STAGING:  Cancer Staging  Metastatic urothelial carcinoma ([place]) Staging form: Urinary Bladder, AJCC 8th Edition - Clinical stage from 08/13/2020: Stage IVB (cTX, cN0, pM1b) - Signed by Derek Jack, MD on 08/13/2020   INTERVAL HISTORY:  [name], a 71 y.o. male, returns for routine follow-up and consideration for next cycle of chemotherapy. Jeremy Rodriguez was last seen on 04/01/2021.  Due for cycle #4 of TRODELVY today.   Overall, he tells me he has been feeling pretty well. He reports occasional left flank pain when lifting objects for which he takes ibuprofen. He reports regular BM and denies diarrhea and constipation; he is not requiring daily stool softener. He takes oxycodone very sparingly,  about once a week, as he is concerned about constipation. His appetite and eating have improved, and he has gained 5 lbs since 04/01/21. He takes 1/2 Xanax tablet at night as he reports taking a whole tablet makes him drowsy the following morning. He denies cough and reports mild SOB with exertion.   Overall, he feels ready for next cycle of chemo today.   REVIEW OF SYSTEMS:  Review of Systems  Constitutional:  Negative for appetite change and fatigue.  Respiratory:  Positive for shortness of breath (mild with exertion). Negative for cough.   Gastrointestinal:  Negative for constipation and diarrhea.  Musculoskeletal:  Positive for flank pain (3/10 L side).  Neurological:  Positive for numbness.  All other systems reviewed and are negative.  PAST MEDICAL/SURGICAL HISTORY:  Past Medical History:  Diagnosis Date   Chronic kidney disease    GERD (gastroesophageal reflux disease)    Hypertension    Medical history non-contributory    Past Surgical History:  Procedure Laterality Date   appendectomy      SOCIAL HISTORY:  Social History   Socioeconomic History   Marital status: Single    Spouse name: Not on file   Number of children: Not on file   Years of education: Not on file   Highest education level: Not on file  Occupational History   Not on file  Tobacco Use   Smoking status: Some Days   Smokeless tobacco: Never   Tobacco comments:    a few cigarettes a day  Substance and Sexual Activity   Alcohol use: Yes    Comment: occ  Drug use: Never   Sexual activity: Not on file  Other Topics Concern   Not on file  Social History Narrative   Not on file   Social Determinants of Health   Financial Resource Strain: Low Risk    Difficulty of Paying Living Expenses: Not hard at all  Food Insecurity: No Food Insecurity   Worried About Running Out of Food in the Last Year: Never true   [place] in the Last Year: Never true  Transportation Needs: No Transportation  Needs   Lack of Transportation (Medical): No   Lack of Transportation (Non-Medical): No  Physical Activity: Inactive   Days of Exercise per Week: 5 days   Minutes of Exercise per Session: 0 min  Stress: No Stress Concern Present   Feeling of Stress : Not at all  Social Connections: Moderately Isolated   Frequency of Communication with Friends and Family: More than three times a week   Frequency of Social Gatherings with Friends and Family: More than three times a week   Attends Religious Services: More than 4 times per year   Active Member of [employer] or Organizations: No   Attends Music therapist: Never   Marital Status: Divorced  Human resources officer Violence: Not At Risk   Fear of Current or Ex-Partner: No   Emotionally Abused: No   Physically Abused: No   Sexually Abused: No    FAMILY HISTORY:  Family History  Problem Relation Age of Onset   Colon cancer Neg Hx    Colon polyps Neg Hx    Liver cancer Neg Hx    Pancreatic cancer Neg Hx     CURRENT MEDICATIONS:  Current Outpatient Medications  Medication Sig Dispense Refill   ALPRAZolam (XANAX) 1 MG tablet Take 1 tablet (1 mg total) by mouth 2 (two) times daily. 60 tablet 3   Lactulose 20 GM/30ML SOLN Take 30 mLs (20 g total) by mouth daily. 473 mL 6   linaclotide (LINZESS) 145 MCG CAPS capsule Take 1 capsule (145 mcg total) by mouth daily before breakfast. 30 capsule 2   megestrol (MEGACE) 400 MG/10ML suspension Take 10 mLs (400 mg total) by mouth daily. 480 mL 3   pantoprazole (PROTONIX) 40 MG tablet Take 1 tablet (40 mg total) by mouth daily. 30 tablet 6   dicyclomine (BENTYL) 10 MG capsule Take 1 capsule (10 mg total) by mouth 3 (three) times daily as needed for spasms. (Patient not taking: Reported on 04/22/2021) 30 capsule 3   docusate sodium (COLACE) 100 MG capsule Take 1 capsule (100 mg total) by mouth daily as needed for mild constipation. (Patient not taking: Reported on 04/22/2021) 30 capsule 0   ibuprofen  (ADVIL) 800 MG tablet Take 1 tablet by mouth twice daily as needed (Patient not taking: Reported on 04/22/2021) 60 tablet 0   oxyCODONE 20 MG TABS Take 0.75 tablets (15 mg total) by mouth every 8 (eight) hours as needed for pain. (Patient not taking: Reported on 04/22/2021) 84 tablet 0   oxyCODONE HCl 15 MG TABA Take 15 mg by mouth every 8 (eight) hours as needed. (Patient not taking: Reported on 04/22/2021) 45 tablet 0   prochlorperazine (COMPAZINE) 10 MG tablet Take 1 tablet (10 mg total) by mouth every 6 (six) hours as needed for nausea or vomiting. (Patient not taking: Reported on 04/22/2021) 30 tablet 3   tadalafil (CIALIS) 10 MG tablet Take 2 tablets (20 mg total) by mouth daily as needed for erectile dysfunction. (  Patient not taking: Reported on 04/22/2021) 30 tablet 3   zolpidem (AMBIEN) 10 MG tablet Take 1 tablet (10 mg total) by mouth at bedtime as needed for sleep. (Patient not taking: Reported on 04/22/2021) 30 tablet 2   No current facility-administered medications for this visit.    ALLERGIES:  No Known Allergies  PHYSICAL EXAM:  Performance status (ECOG): 1 - Symptomatic but completely ambulatory  Vitals:   04/22/21 0914  BP: (!) 160/89  Pulse: 90  Resp: 18  Temp: 98.1 F (36.7 C)  SpO2: 100%   Wt Readings from Last 3 Encounters:  04/22/21 152 lb (68.9 kg)  04/08/21 146 lb 9.6 oz (66.5 kg)  04/01/21 147 lb 0.8 oz (66.7 kg)   Physical Exam Vitals reviewed.  Constitutional:      Appearance: Normal appearance.  Cardiovascular:     Rate and Rhythm: Normal rate and regular rhythm.     Pulses: Normal pulses.     Heart sounds: Normal heart sounds.  Pulmonary:     Effort: Pulmonary effort is normal.     Breath sounds: Normal breath sounds.  Abdominal:     Palpations: Abdomen is soft. There is hepatomegaly (liver edge palpable). There is no splenomegaly or mass.     Tenderness: There is abdominal tenderness in the right upper quadrant.  Musculoskeletal:     Right lower  leg: No edema.     Left lower leg: No edema.  Neurological:     General: No focal deficit present.     Mental Status: He is alert and oriented to person, place, and time.  Psychiatric:        Mood and Affect: Mood normal.        Behavior: Behavior normal.    LABORATORY DATA:  I have reviewed the labs as listed.  CBC Latest Ref Rng & Units 04/22/2021 04/08/2021 04/01/2021  WBC 4.0 - 10.5 K/uL 4.8 4.3 5.4  Hemoglobin 13.0 - 17.0 g/dL 11.7(L) 11.9(L) 12.3(L)  Hematocrit 39.0 - 52.0 % 35.7(L) 35.2(L) 36.9(L)  Platelets 150 - 400 K/uL 262 294 247   CMP Latest Ref Rng & Units 04/22/2021 04/08/2021 04/01/2021  Glucose 70 - 99 mg/dL 132(H) 145(H) 167(H)  BUN 8 - 23 mg/dL _0 Creatinine 0.61 - 1.24 mg/dL 0.99 1.06 1.00  Sodium 135 - 145 mmol/L 137 136 137  Potassium 3.5 - 5.1 mmol/L 3.8 4.0 3.6  Chloride 98 - 111 mmol/L 109 107 107  CO2 22 - 32 mmol/L 21(L) 21(L) 22  Calcium 8.9 - 10.3 mg/dL 8.8(L) 8.8(L) 8.9  Total Protein 6.5 - 8.1 g/dL 6.7 6.7 6.7  Total Bilirubin 0.3 - 1.2 mg/dL 0.4 0.5 0.3  Alkaline Phos 38 - 126 U/L 81 73 77  AST 15 - 41 U/L _1 ALT 0 - 44 U/L _2 DIAGNOSTIC IMAGING:  I have independently reviewed the scans and discussed with the patient. No results found.   ASSESSMENT:  1. 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 - 20 pound weight loss in the last 4 months due to decreased appetite.  Left upper quadrant pain for the past 2 to 3 months. - CTAP with contrast on 07/10/2020 showed ill-defined low-density mass in the right hepatic lobe measuring 2 x 2 cm.  Left upper pole kidney mass measuring 7.6 x 5.9 cm with encasement and narrowing of  the left renal artery and vein.  No intravascular thrombus noted.  Mass encases the left adrenal gland.  Small right adrenal nodule measuring 11 mm, likely benign.  No other evidence of metastatic  disease. - MRI of the liver with and without contrast on 07/20/2020 shows 2.2 x 1.7 cm enhancing lesion in the medial aspect of the segment 7, suspicious for metastasis.  Left renal mass, 6 x 7.6 x 6.9 cm infiltrating enhancing left upper pole renal mass, extending into the left renal sinus.  Possible TCC or lymphoma rather than renal cell carcinoma. - CT chest on 07/20/2020 with no evidence of metastatic disease. - PET scan on 08/04/2020 showed large hypermetabolic left upper pole mass and 2 hypermetabolic lesions in the right hepatic lobe favoring metastatic disease.  No hypermetabolic adenopathy.  - Liver biopsy showed poorly differentiated metastatic urothelial carcinoma.  IHC positive for CK5/6, CK7, GATA3, p40 and patchy positivity with p63 and PAX8. - He refused any chemotherapy. - 4 cycles of pembrolizumab from 08/20/2020 through 10/22/2020 with progression. - NGS testing showed benefit from pembrolizumab.  No mutations involving FGFR, NTRK was found.  Test was limited due to small sample. - CT CAP on 11/11/2020 showed interval progression of liver metastasis.  No significant change infiltrative mass arising in the upper pole of the left kidney invading the medial aspect of the upper spleen, left renal vein and left adrenal gland with soft tissue infiltration to the left retroperitoneal fat.  No evidence of metastatic disease to the chest. - Progression on 3 cycles of enfortumab vedotin. - CT CAP on 02/15/2021 showed decrease in size of the left renal mass.  Interval progression of numerous liver metastasis. - Guardant 360 results-KRAS G12D, PDGFRA T230T, DDR2 [id number], MSI high not detected.   2.  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 - Reports exposure to carbon black dust.  Smoked half pack per day for 30 years, currently smoking 2 to 3 cigarettes/day. - No family history of malignancy.   PLAN:  1. Metastatic poorly differentiated  urothelial carcinoma arising in the renal pelvis: - He has completed 3 cycles of sacituzumab govitecan on 04/01/2021. - He reports some left upper quadrant pain when he moved his Christmas tree.  Denied any diarrhea.  He has 4 pound weight gain. - Reviewed labs which showed normal LFTs.  CBC was also grossly normal. - Recommend proceeding with cycle 4 today.  RTC 3 weeks.  Plan to repeat CT CAP with contrast to evaluate response.     2.  Left upper quadrant pain: - He is taking ibuprofen 800 mg twice daily. - He is taking oxycodone 20 mg 1 tablet every 2 to 3 days.  3.  Constipation: - He is continuing senna 2 tablets at bedtime.  He is not having any constipation if he is not taking oxycodone.  4.  Normocytic to microcytic anemia: - Hemoglobin is 11.7.  No need for parenteral iron therapy.  5.  Loss of appetite: - He has not required Megace.  He has also gained 5 pounds.  6.  Anxiety: - He is taking half tablet of Xanax 1 mg at bedtime as needed which is helping control his anxiety and sleep.   Orders placed this encounter:  Orders Placed This Encounter  Procedures   CT CHEST [place], MD [place] [phone]   I, Thana Ates, am acting as a Education administrator  for Dr. Derek Jack.  I, Derek Jack MD, have reviewed the above documentation for accuracy and completeness, and I agree with the above.

## 2021-04-22 NOTE — Progress Notes (Signed)
Patient has been examined by Dr. Katragadda, and vital signs and labs have been reviewed. ANC, Creatinine, LFTs, hemoglobin, and platelets are within treatment parameters per M.D. - pt may proceed with treatment.    °

## 2021-04-22 NOTE — Patient Instructions (Signed)
[facility] at [facility] Discharge Instructions   You were seen and examined today by Dr. Delton Coombes.  He reviewed your labs with you which are normal/stable.  We will proceed with your treatment today.  Return as scheduled.    Thank you for choosing [place] at [facility] to provide your oncology and hematology care.  To afford each patient quality time with our provider, please arrive at least 15 minutes before your scheduled appointment time.   If you have a lab appointment with the [facility] please come in thru the Main Entrance and check in at the main information desk.  You need to re-schedule your appointment should you arrive 10 or more minutes late.  We strive to give you quality time with our providers, and arriving late affects you and other patients whose appointments are after yours.  Also, if you no show three or more times for appointments you may be dismissed from the clinic at the providers discretion.     Again, thank you for choosing [facility].  Our hope is that these requests will decrease the amount of time that you wait before being seen by our physicians.       _____________________________________________________________  Should you have questions after your visit to [facility], please contact our office at [phone] and follow the prompts.  Our office hours are 8:00 a.m. and 4:30 p.m. Monday - Friday.  Please note that voicemails left after 4:00 p.m. may not be returned until the following business day.  We are closed weekends and major holidays.  You do have access to a nurse 24-7, just call the main number to the clinic [phone] and do not press any options, hold on the line and a nurse will answer the phone.    For prescription refill requests, have your pharmacy contact our office and allow 72 hours.    Due to Covid, you will need to wear a mask upon entering the hospital.  If you do not have a mask, a mask will be given to you at the Main Entrance upon arrival. For doctor visits, patients may have 1 support person age 49 or older with them. For treatment visits, patients can not have anyone with them due to social distancing guidelines and our immunocompromised population.

## 2021-04-22 NOTE — Progress Notes (Signed)
Pt present today for Trodelvy per provider's order. Vital signs and labs WNL for treatment today. Okay to proceed with treatment per Dr.K. Pt voiced no new complaints at this time.  Peripheral IV started with good blood return pre and post infusion.  Ivette Loyal given today per MD orders. Tolerated infusion without adverse affects. Vital signs stable. No complaints at this time. Discharged from clinic ambulatory in stable condition. Alert and oriented x 3. F/U with [facility] as scheduled.

## 2021-04-25 ENCOUNTER — Other Ambulatory Visit (HOSPITAL_COMMUNITY): Payer: Self-pay | Admitting: Hematology

## 2021-04-25 DIAGNOSIS — R109 Unspecified abdominal pain: Secondary | ICD-10-CM

## 2021-04-25 DIAGNOSIS — R140 Abdominal distension (gaseous): Secondary | ICD-10-CM

## 2021-04-26 ENCOUNTER — Encounter (HOSPITAL_COMMUNITY): Payer: Self-pay | Admitting: Hematology

## 2021-04-29 ENCOUNTER — Other Ambulatory Visit (HOSPITAL_COMMUNITY): Payer: Self-pay

## 2021-04-29 ENCOUNTER — Inpatient Hospital Stay (HOSPITAL_COMMUNITY): Payer: Medicare Other

## 2021-04-29 ENCOUNTER — Other Ambulatory Visit (HOSPITAL_COMMUNITY): Payer: Self-pay | Admitting: Hematology

## 2021-04-29 ENCOUNTER — Other Ambulatory Visit: Payer: Self-pay

## 2021-04-29 VITALS — BP 132/67 | HR 79 | Temp 98.2°F | Resp 18 | Wt 150.6 lb

## 2021-04-29 DIAGNOSIS — C7902 Secondary malignant neoplasm of left kidney and renal pelvis: Secondary | ICD-10-CM | POA: Diagnosis not present

## 2021-04-29 DIAGNOSIS — C7910 Secondary malignant neoplasm of unspecified urinary organs: Secondary | ICD-10-CM

## 2021-04-29 DIAGNOSIS — C679 Malignant neoplasm of bladder, unspecified: Secondary | ICD-10-CM | POA: Diagnosis not present

## 2021-04-29 LAB — CBC WITH DIFFERENTIAL/PLATELET
Abs Immature Granulocytes: 0.06 10*3/uL (ref 0.00–0.07)
Basophils Absolute: 0 10*3/uL (ref 0.0–0.1)
Basophils Relative: 1 %
Eosinophils Absolute: 0.1 10*3/uL (ref 0.0–0.5)
Eosinophils Relative: 2 %
HCT: 35.6 % — ABNORMAL LOW (ref 39.0–52.0)
Hemoglobin: 12.3 g/dL — ABNORMAL LOW (ref 13.0–17.0)
Immature Granulocytes: 1 %
Lymphocytes Relative: 27 %
Lymphs Abs: 1.2 10*3/uL (ref 0.7–4.0)
MCH: 31 pg (ref 26.0–34.0)
MCHC: 34.6 g/dL (ref 30.0–36.0)
MCV: 89.7 fL (ref 80.0–100.0)
Monocytes Absolute: 0.7 10*3/uL (ref 0.1–1.0)
Monocytes Relative: 15 %
Neutro Abs: 2.3 10*3/uL (ref 1.7–7.7)
Neutrophils Relative %: 54 %
Platelets: 334 10*3/uL (ref 150–400)
RBC: 3.97 MIL/uL — ABNORMAL LOW (ref 4.22–5.81)
RDW: 15.6 % — ABNORMAL HIGH (ref 11.5–15.5)
WBC: 4.4 10*3/uL (ref 4.0–10.5)
nRBC: 0 % (ref 0.0–0.2)

## 2021-04-29 LAB — COMPREHENSIVE METABOLIC PANEL
ALT: 28 U/L (ref 0–44)
AST: 22 U/L (ref 15–41)
Albumin: 3.6 g/dL (ref 3.5–5.0)
Alkaline Phosphatase: 83 U/L (ref 38–126)
Anion gap: 8 (ref 5–15)
BUN: 15 mg/dL (ref 8–23)
CO2: 23 mmol/L (ref 22–32)
Calcium: 8.8 mg/dL — ABNORMAL LOW (ref 8.9–10.3)
Chloride: 104 mmol/L (ref 98–111)
Creatinine, Ser: 1.01 mg/dL (ref 0.61–1.24)
GFR, Estimated: >60 mL/min (ref >60)
Glucose, Bld: 123 mg/dL — ABNORMAL HIGH (ref 70–99)
Potassium: 3.8 mmol/L (ref 3.5–5.1)
Sodium: 135 mmol/L (ref 135–145)
Total Bilirubin: 0.3 mg/dL (ref 0.3–1.2)
Total Protein: 6.8 g/dL (ref 6.5–8.1)

## 2021-04-29 LAB — MAGNESIUM: Magnesium: 2.2 mg/dL (ref 1.7–2.4)

## 2021-04-29 LAB — PHOSPHORUS: Phosphorus: 2.9 mg/dL (ref 2.5–4.6)

## 2021-04-29 MED ORDER — SODIUM CHLORIDE 0.9 % IV SOLN
10.0000 mg/kg | Freq: Once | INTRAVENOUS | Status: AC
  Administered 2021-04-29: 670 mg via INTRAVENOUS
  Filled 2021-04-29: qty 67

## 2021-04-29 MED ORDER — FAMOTIDINE IN NACL 20-0.9 MG/50ML-% IV SOLN
20.0000 mg | Freq: Once | INTRAVENOUS | Status: AC
  Administered 2021-04-29: 20 mg via INTRAVENOUS
  Filled 2021-04-29: qty 50

## 2021-04-29 MED ORDER — SODIUM CHLORIDE 0.9 % IV SOLN
10.0000 mg | Freq: Once | INTRAVENOUS | Status: AC
  Administered 2021-04-29: 10 mg via INTRAVENOUS
  Filled 2021-04-29: qty 10

## 2021-04-29 MED ORDER — SODIUM CHLORIDE 0.9 % IV SOLN: Freq: Once | INTRAVENOUS | Status: AC

## 2021-04-29 MED ORDER — OXYCODONE HCL 20 MG PO TABS: 15.0000 mg | ORAL_TABLET | Freq: Three times a day (TID) | ORAL | 0 refills | Status: DC | PRN

## 2021-04-29 MED ORDER — ACETAMINOPHEN 325 MG PO TABS
650.0000 mg | ORAL_TABLET | Freq: Once | ORAL | Status: AC
  Administered 2021-04-29: 650 mg via ORAL
  Filled 2021-04-29: qty 2

## 2021-04-29 MED ORDER — SODIUM CHLORIDE 0.9 % IV SOLN
150.0000 mg | Freq: Once | INTRAVENOUS | Status: AC
  Administered 2021-04-29: 150 mg via INTRAVENOUS
  Filled 2021-04-29: qty 150

## 2021-04-29 MED ORDER — PALONOSETRON HCL INJECTION 0.25 MG/5ML
0.2500 mg | Freq: Once | INTRAVENOUS | Status: AC
  Administered 2021-04-29: 0.25 mg via INTRAVENOUS
  Filled 2021-04-29: qty 5

## 2021-04-29 MED ORDER — IBUPROFEN 800 MG PO TABS: 800.0000 mg | ORAL_TABLET | Freq: Two times a day (BID) | ORAL | 0 refills | Status: DC | PRN

## 2021-04-29 NOTE — Progress Notes (Signed)
Patient presents today for chemotherapy infusion.  Patient is in satisfactory condition with no complaints voiced.  Vital signs are stable.  Labs reviewed and all labs are within treatment parameters.  We will proceed with treatment per MD orders.   Patient tolerated treatment well with no complaints voiced.  Patient left ambulatory in stable condition.  Vital signs stable at discharge.  Follow up as scheduled.    

## 2021-04-29 NOTE — Patient Instructions (Signed)
[facility]  Discharge Instructions: Thank you for choosing [facility] to provide your oncology and hematology care.  If you have a lab appointment with the Cancer Center, please come in thru the Main Entrance and check in at the main information desk.  Wear comfortable clothing and clothing appropriate for easy access to any Portacath or PICC line.   We strive to give you quality time with your provider. You may need to reschedule your appointment if you arrive late (15 or more minutes).  Arriving late affects you and other patients whose appointments are after yours.  Also, if you miss three or more appointments without notifying the office, you may be dismissed from the clinic at the provider's discretion.      For prescription refill requests, have your pharmacy contact our office and allow 72 hours for refills to be completed.        To help prevent nausea and vomiting after your treatment, we encourage you to take your nausea medication as directed.  BELOW ARE SYMPTOMS THAT SHOULD BE REPORTED IMMEDIATELY: *FEVER GREATER THAN 100.4 F (38 C) OR HIGHER *CHILLS OR SWEATING *NAUSEA AND VOMITING THAT IS NOT CONTROLLED WITH YOUR NAUSEA MEDICATION *UNUSUAL SHORTNESS OF BREATH *UNUSUAL BRUISING OR BLEEDING *URINARY PROBLEMS (pain or burning when urinating, or frequent urination) *BOWEL PROBLEMS (unusual diarrhea, constipation, pain near the anus) TENDERNESS IN MOUTH AND THROAT WITH OR WITHOUT PRESENCE OF ULCERS (sore throat, sores in mouth, or a toothache) UNUSUAL RASH, SWELLING OR PAIN  UNUSUAL VAGINAL DISCHARGE OR ITCHING   Items with * indicate a potential emergency and should be followed up as soon as possible or go to the Emergency Department if any problems should occur.  Please show the CHEMOTHERAPY ALERT CARD or IMMUNOTHERAPY ALERT CARD at check-in to the Emergency Department and triage nurse.  Should you have questions after your visit or need to cancel  or reschedule your appointment, please contact [facility] [phone]  and follow the prompts.  Office hours are 8:00 a.m. to 4:30 p.m. Monday - Friday. Please note that voicemails left after 4:00 p.m. may not be returned until the following business day.  We are closed weekends and major holidays. You have access to a nurse at all times for urgent questions. Please call the main number to the clinic [phone] and follow the prompts.  For any non-urgent questions, you may also contact your provider using MyChart. We now offer e-Visits for anyone 18 and older to request care online for non-urgent symptoms. For details visit [email or web address].   Also download the MyChart app! Go to the app store, search "MyChart", open the app, select [facility], and log in with your MyChart username and password.  Due to Covid, a mask is required upon entering the hospital/clinic. If you do not have a mask, one will be given to you upon arrival. For doctor visits, patients may have 1 support person aged 18 or older with them. For treatment visits, patients cannot have anyone with them due to current Covid guidelines and our immunocompromised population.  

## 2021-05-06 ENCOUNTER — Ambulatory Visit (HOSPITAL_COMMUNITY)
Admission: RE | Admit: 2021-05-06 | Discharge: 2021-05-06 | Disposition: A | Discharge status: Home / Self Care | Payer: Medicare Other | Source: Ambulatory Visit | Attending: Hematology | Admitting: Hematology

## 2021-05-06 ENCOUNTER — Other Ambulatory Visit: Payer: Self-pay

## 2021-05-06 DIAGNOSIS — C787 Secondary malignant neoplasm of liver and intrahepatic bile duct: Secondary | ICD-10-CM | POA: Diagnosis not present

## 2021-05-06 DIAGNOSIS — C7910 Secondary malignant neoplasm of unspecified urinary organs: Secondary | ICD-10-CM | POA: Insufficient documentation

## 2021-05-06 DIAGNOSIS — I700 Atherosclerosis of aorta: Secondary | ICD-10-CM | POA: Insufficient documentation

## 2021-05-06 DIAGNOSIS — N200 Calculus of kidney: Secondary | ICD-10-CM | POA: Diagnosis not present

## 2021-05-06 DIAGNOSIS — N2889 Other specified disorders of kidney and ureter: Secondary | ICD-10-CM | POA: Diagnosis not present

## 2021-05-06 DIAGNOSIS — K7689 Other specified diseases of liver: Secondary | ICD-10-CM | POA: Diagnosis not present

## 2021-05-06 DIAGNOSIS — R590 Localized enlarged lymph nodes: Secondary | ICD-10-CM | POA: Diagnosis not present

## 2021-05-06 DIAGNOSIS — J984 Other disorders of lung: Secondary | ICD-10-CM | POA: Diagnosis not present

## 2021-05-06 DIAGNOSIS — N289 Disorder of kidney and ureter, unspecified: Secondary | ICD-10-CM | POA: Diagnosis not present

## 2021-05-06 MED ORDER — IOHEXOL 300 MG/ML  SOLN
100.0000 mL | Freq: Once | INTRAMUSCULAR | Status: AC | PRN
  Administered 2021-05-06: 100 mL via INTRAVENOUS

## 2021-05-13 ENCOUNTER — Other Ambulatory Visit: Payer: Self-pay

## 2021-05-13 ENCOUNTER — Inpatient Hospital Stay (HOSPITAL_COMMUNITY): Payer: Medicare Other

## 2021-05-13 ENCOUNTER — Inpatient Hospital Stay (HOSPITAL_BASED_OUTPATIENT_CLINIC_OR_DEPARTMENT_OTHER): Payer: Medicare Other | Admitting: Hematology

## 2021-05-13 ENCOUNTER — Inpatient Hospital Stay (HOSPITAL_COMMUNITY): Payer: Medicare Other | Attending: Hematology

## 2021-05-13 VITALS — BP 133/68 | HR 77 | Temp 98.1°F | Resp 18

## 2021-05-13 VITALS — BP 143/76 | HR 85 | Temp 98.0°F | Resp 18 | Ht 65.0 in | Wt 154.8 lb

## 2021-05-13 DIAGNOSIS — C679 Malignant neoplasm of bladder, unspecified: Secondary | ICD-10-CM | POA: Insufficient documentation

## 2021-05-13 DIAGNOSIS — R109 Unspecified abdominal pain: Secondary | ICD-10-CM

## 2021-05-13 DIAGNOSIS — C787 Secondary malignant neoplasm of liver and intrahepatic bile duct: Secondary | ICD-10-CM | POA: Insufficient documentation

## 2021-05-13 DIAGNOSIS — R140 Abdominal distension (gaseous): Secondary | ICD-10-CM

## 2021-05-13 DIAGNOSIS — C7910 Secondary malignant neoplasm of unspecified urinary organs: Secondary | ICD-10-CM

## 2021-05-13 DIAGNOSIS — C7900 Secondary malignant neoplasm of unspecified kidney and renal pelvis: Secondary | ICD-10-CM | POA: Diagnosis not present

## 2021-05-13 LAB — COMPREHENSIVE METABOLIC PANEL
ALT: 27 U/L (ref 0–44)
AST: 21 U/L (ref 15–41)
Albumin: 3.4 g/dL — ABNORMAL LOW (ref 3.5–5.0)
Alkaline Phosphatase: 82 U/L (ref 38–126)
Anion gap: 8 (ref 5–15)
BUN: 13 mg/dL (ref 8–23)
CO2: 19 mmol/L — ABNORMAL LOW (ref 22–32)
Calcium: 8.7 mg/dL — ABNORMAL LOW (ref 8.9–10.3)
Chloride: 108 mmol/L (ref 98–111)
Creatinine, Ser: 0.96 mg/dL (ref 0.61–1.24)
GFR, Estimated: >60 mL/min (ref >60)
Glucose, Bld: 167 mg/dL — ABNORMAL HIGH (ref 70–99)
Potassium: 3.9 mmol/L (ref 3.5–5.1)
Sodium: 135 mmol/L (ref 135–145)
Total Bilirubin: 0.8 mg/dL (ref 0.3–1.2)
Total Protein: 6.3 g/dL — ABNORMAL LOW (ref 6.5–8.1)

## 2021-05-13 LAB — CBC WITH DIFFERENTIAL/PLATELET
Abs Immature Granulocytes: 0.02 10*3/uL (ref 0.00–0.07)
Basophils Absolute: 0 10*3/uL (ref 0.0–0.1)
Basophils Relative: 1 %
Eosinophils Absolute: 0.1 10*3/uL (ref 0.0–0.5)
Eosinophils Relative: 2 %
HCT: 35.8 % — ABNORMAL LOW (ref 39.0–52.0)
Hemoglobin: 11.9 g/dL — ABNORMAL LOW (ref 13.0–17.0)
Immature Granulocytes: 1 %
Lymphocytes Relative: 20 %
Lymphs Abs: 0.9 10*3/uL (ref 0.7–4.0)
MCH: 29.9 pg (ref 26.0–34.0)
MCHC: 33.2 g/dL (ref 30.0–36.0)
MCV: 89.9 fL (ref 80.0–100.0)
Monocytes Absolute: 0.6 10*3/uL (ref 0.1–1.0)
Monocytes Relative: 14 %
Neutro Abs: 2.6 10*3/uL (ref 1.7–7.7)
Neutrophils Relative %: 62 %
Platelets: 250 10*3/uL (ref 150–400)
RBC: 3.98 MIL/uL — ABNORMAL LOW (ref 4.22–5.81)
RDW: 16.2 % — ABNORMAL HIGH (ref 11.5–15.5)
WBC: 4.2 10*3/uL (ref 4.0–10.5)
nRBC: 0 % (ref 0.0–0.2)

## 2021-05-13 LAB — MAGNESIUM: Magnesium: 2 mg/dL (ref 1.7–2.4)

## 2021-05-13 LAB — PHOSPHORUS: Phosphorus: 3.3 mg/dL (ref 2.5–4.6)

## 2021-05-13 MED ORDER — SODIUM CHLORIDE 0.9 % IV SOLN
10.0000 mg/kg | Freq: Once | INTRAVENOUS | Status: AC
  Administered 2021-05-13: 670 mg via INTRAVENOUS
  Filled 2021-05-13: qty 67

## 2021-05-13 MED ORDER — ACETAMINOPHEN 325 MG PO TABS
650.0000 mg | ORAL_TABLET | Freq: Once | ORAL | Status: AC
  Administered 2021-05-13: 650 mg via ORAL
  Filled 2021-05-13: qty 2

## 2021-05-13 MED ORDER — PALONOSETRON HCL INJECTION 0.25 MG/5ML
0.2500 mg | Freq: Once | INTRAVENOUS | Status: AC
  Administered 2021-05-13: 0.25 mg via INTRAVENOUS
  Filled 2021-05-13: qty 5

## 2021-05-13 MED ORDER — DICYCLOMINE HCL 10 MG PO CAPS: 10.0000 mg | ORAL_CAPSULE | Freq: Three times a day (TID) | ORAL | 6 refills | Status: DC | PRN

## 2021-05-13 MED ORDER — SODIUM CHLORIDE 0.9 % IV SOLN
150.0000 mg | Freq: Once | INTRAVENOUS | Status: AC
  Administered 2021-05-13: 150 mg via INTRAVENOUS
  Filled 2021-05-13: qty 150

## 2021-05-13 MED ORDER — FAMOTIDINE IN NACL 20-0.9 MG/50ML-% IV SOLN
20.0000 mg | Freq: Once | INTRAVENOUS | Status: AC
  Administered 2021-05-13: 20 mg via INTRAVENOUS
  Filled 2021-05-13: qty 50

## 2021-05-13 MED ORDER — SODIUM CHLORIDE 0.9 % IV SOLN: Freq: Once | INTRAVENOUS | Status: AC

## 2021-05-13 NOTE — Patient Instructions (Signed)
[facility]  Discharge Instructions: Thank you for choosing [facility] to provide your oncology and hematology care.  If you have a lab appointment with the [facility], please come in thru the Main Entrance and check in at the main information desk.  Wear comfortable clothing and clothing appropriate for easy access to any Portacath or PICC line.   We strive to give you quality time with your provider. You may need to reschedule your appointment if you arrive late (15 or more minutes).  Arriving late affects you and other patients whose appointments are after yours.  Also, if you miss three or more appointments without notifying the office, you may be dismissed from the clinic at the providers discretion.      For prescription refill requests, have your pharmacy contact our office and allow 72 hours for refills to be completed.    Today you received the following chemotherapy and/or immunotherapy agents Trodelvy.      To help prevent nausea and vomiting after your treatment, we encourage you to take your nausea medication as directed.  BELOW ARE SYMPTOMS THAT SHOULD BE REPORTED IMMEDIATELY: *FEVER GREATER THAN 100.4 F (38 C) OR HIGHER *CHILLS OR SWEATING *NAUSEA AND VOMITING THAT IS NOT CONTROLLED WITH YOUR NAUSEA MEDICATION *UNUSUAL SHORTNESS OF BREATH *UNUSUAL BRUISING OR BLEEDING *URINARY PROBLEMS (pain or burning when urinating, or frequent urination) *BOWEL PROBLEMS (unusual diarrhea, constipation, pain near the anus) TENDERNESS IN MOUTH AND THROAT WITH OR WITHOUT PRESENCE OF ULCERS (sore throat, sores in mouth, or a toothache) UNUSUAL RASH, SWELLING OR PAIN  UNUSUAL VAGINAL DISCHARGE OR ITCHING   Items with * indicate a potential emergency and should be followed up as soon as possible or go to the Emergency Department if any problems should occur.  Please show the CHEMOTHERAPY ALERT CARD or IMMUNOTHERAPY ALERT CARD at check-in to the Emergency  Department and triage nurse.  Should you have questions after your visit or need to cancel or reschedule your appointment, please contact [facility] [phone]  and follow the prompts.  Office hours are 8:00 a.m. to 4:30 p.m. Monday - Friday. Please note that voicemails left after 4:00 p.m. may not be returned until the following business day.  We are closed weekends and major holidays. You have access to a nurse at all times for urgent questions. Please call the main number to the clinic [phone] and follow the prompts.  For any non-urgent questions, you may also contact your provider using MyChart. We now offer e-Visits for anyone 23 and older to request care online for non-urgent symptoms. For details visit [email or web address].   Also download the MyChart app! Go to the app store, search "MyChart", open the app, select [facility], and log in with your MyChart username and password.  Due to Covid, a mask is required upon entering the hospital/clinic. If you do not have a mask, one will be given to you upon arrival. For doctor visits, patients may have 1 support person aged 17 or older with them. For treatment visits, patients cannot have anyone with them due to current Covid guidelines and our immunocompromised population.

## 2021-05-13 NOTE — Progress Notes (Signed)
Patient presents today for treatment and follow up visit with Dr. Delton Coombes. Labs reviewed by MD. Vital signs within parameters for treatment. Labs within parameters for treatment. Patient has no complaints related to treatment. Patient denies pain today.   Message received from A. Ouida Sills RN / Dr. Delton Coombes to proceed with today's treatment. Per A. Anderson no Decadron due to blurred vision after treatment.   Treatment given today per MD orders. Tolerated infusion without adverse affects. Vital signs stable. No complaints at this time. Discharged from clinic ambulatory in stable condition. Alert and oriented x 3. F/U with [facility] as scheduled.

## 2021-05-13 NOTE — Progress Notes (Signed)
Dr Delton Coombes is holding dexamethasone as premedication today due to blurred vision.  Henreitta Leber, PharmD

## 2021-05-13 NOTE — Progress Notes (Signed)
[facility] [address], [place] [address]   CLINIC:  Medical Oncology/Hematology  PCP:  Lindell Spar, MD [address] / [place] [address] [phone]   REASON FOR VISIT:  Follow-up for metastatic urothelial carcinoma  PRIOR THERAPY:  Keytruda every 3 weeks Enfortumab every 4 weeks  NGS Results: not done  CURRENT THERAPY: Sacituzumab govitecan-hziy [name]  BRIEF ONCOLOGIC HISTORY:  Oncology History  Metastatic urothelial carcinoma ([place])  08/13/2020 Initial Diagnosis   Metastatic urothelial carcinoma ([place])   08/13/2020 Cancer Staging   Staging form: Urinary Bladder, AJCC 8th Edition - Clinical stage from 08/13/2020: Stage IVB (cTX, cN0, pM1b) - Signed by Derek Jack, MD on 08/13/2020 Stage prefix: Initial diagnosis WHO/ISUP grade (low/high): High Grade Histologic grading system: 2 grade system    08/20/2020 - 10/22/2020 Chemotherapy          11/19/2020 - 01/28/2021 Chemotherapy   Patient is on Treatment Plan : UROTHELIAL LOCALLY ADVANCED / METASTATIC Enfortumab q28d     02/15/2021 -  Chemotherapy   Patient is on Treatment Plan : BLADDER Sacituzumab govitecan-hziy Ivette Loyal) q21d       CANCER STAGING:  Cancer Staging  Metastatic urothelial carcinoma ([place]) Staging form: Urinary Bladder, AJCC 8th Edition - Clinical stage from 08/13/2020: Stage IVB (cTX, cN0, pM1b) - Signed by Derek Jack, MD on 08/13/2020   INTERVAL HISTORY:  [name], a 71 y.o. male, returns for routine follow-up and consideration for next cycle of chemotherapy. [name] was last seen on 04/22/2021.  Due for cycle #5 of Trodelvy today.   Overall, he tells me he has been feeling pretty well. He is taking oxycodone about once weekly prn for pain. His appetite is good, and he has gained 2 lbs since 04/22/2021. He reports blurry vision for a few hours following treatment. He denies diarrhea and nausea. He has  constipation for 1 day after treatment which he reports is well controlled. He takes 800 mg ibuprofen 1-2 times daily prn.   Overall, he feels ready for next cycle of chemo today.   REVIEW OF SYSTEMS:  Review of Systems  Constitutional:  Negative for appetite change, fatigue and unexpected weight change (+2 lbs).  Eyes:  Positive for eye problems (blurry).  Gastrointestinal:  Negative for constipation, diarrhea and nausea.  All other systems reviewed and are negative.  PAST MEDICAL/SURGICAL HISTORY:  Past Medical History:  Diagnosis Date   Chronic kidney disease    GERD (gastroesophageal reflux disease)    Hypertension    Medical history non-contributory    Past Surgical History:  Procedure Laterality Date   appendectomy      SOCIAL HISTORY:  Social History   Socioeconomic History   Marital status: Single    Spouse name: Not on file   Number of children: Not on file   Years of education: Not on file   Highest education level: Not on file  Occupational History   Not on file  Tobacco Use   Smoking status: Some Days   Smokeless tobacco: Never   Tobacco comments:    a few cigarettes a day  Substance and Sexual Activity   Alcohol use: Yes    Comment: occ   Drug use: Never   Sexual activity: Not on file  Other Topics Concern   Not on file  Social History Narrative   Not on file   Social Determinants of Health   Financial Resource Strain: Low Risk  Difficulty of Paying Living Expenses: Not hard at all  Food Insecurity: No Food Insecurity   Worried About [place] in the Last Year: Never true   Ran Out of Food in the Last Year: Never true  Transportation Needs: No Transportation Needs   Lack of Transportation (Medical): No   Lack of Transportation (Non-Medical): No  Physical Activity: Inactive   Days of Exercise per Week: 5 days   Minutes of Exercise per Session: 0 min  Stress: No Stress Concern Present   Feeling of Stress : Not at all  Social  Connections: Moderately Isolated   Frequency of Communication with Friends and Family: More than three times a week   Frequency of Social Gatherings with Friends and Family: More than three times a week   Attends Religious Services: More than 4 times per year   Active Member of [employer] or Organizations: No   Attends Music therapist: Never   Marital Status: Divorced  Human resources officer Violence: Not At Risk   Fear of Current or Ex-Partner: No   Emotionally Abused: No   Physically Abused: No   Sexually Abused: No    FAMILY HISTORY:  Family History  Problem Relation Age of Onset   Colon cancer Neg Hx    Colon polyps Neg Hx    Liver cancer Neg Hx    Pancreatic cancer Neg Hx     CURRENT MEDICATIONS:  Current Outpatient Medications  Medication Sig Dispense Refill   ALPRAZolam (XANAX) 1 MG tablet Take 1 tablet (1 mg total) by mouth 2 (two) times daily. 60 tablet 3   dicyclomine (BENTYL) 10 MG capsule TAKE 1 CAPSULE BY MOUTH THREE TIMES DAILY AS NEEDED FOR SPASMS 30 capsule 0   docusate sodium (COLACE) 100 MG capsule Take 1 capsule (100 mg total) by mouth daily as needed for mild constipation. 30 capsule 0   ibuprofen (ADVIL) 800 MG tablet Take 1 tablet (800 mg total) by mouth 2 (two) times daily as needed. 60 tablet 0   Lactulose 20 GM/30ML SOLN Take 30 mLs (20 g total) by mouth daily. 473 mL 6   linaclotide (LINZESS) 145 MCG CAPS capsule Take 1 capsule (145 mcg total) by mouth daily before breakfast. 30 capsule 2   megestrol (MEGACE) 400 MG/10ML suspension Take 10 mLs (400 mg total) by mouth daily. 480 mL 3   Oxycodone HCl 20 MG TABS Take 0.75 tablets (15 mg total) by mouth every 8 (eight) hours as needed. 84 tablet 0   pantoprazole (PROTONIX) 40 MG tablet Take 1 tablet (40 mg total) by mouth daily. 30 tablet 6   prochlorperazine (COMPAZINE) 10 MG tablet Take 1 tablet (10 mg total) by mouth every 6 (six) hours as needed for nausea or vomiting. 30 tablet 3   tadalafil (CIALIS)  10 MG tablet Take 2 tablets (20 mg total) by mouth daily as needed for erectile dysfunction. 30 tablet 3   zolpidem (AMBIEN) 10 MG tablet Take 1 tablet (10 mg total) by mouth at bedtime as needed for sleep. 30 tablet 2   No current facility-administered medications for this visit.    ALLERGIES:  No Known Allergies  PHYSICAL EXAM:  Performance status (ECOG): 1 - Symptomatic but completely ambulatory  Vitals:   05/13/21 0922  BP: (!) 143/76  Pulse: 85  Resp: 18  Temp: 98 F (36.7 C)  SpO2: 100%   Wt Readings from Last 3 Encounters:  05/13/21 154 lb 12.2 oz (70.2 kg)  04/29/21 150  lb 9.6 oz (68.3 kg)  04/22/21 152 lb (68.9 kg)   Physical Exam Vitals reviewed.  Constitutional:      Appearance: Normal appearance.  Cardiovascular:     Rate and Rhythm: Normal rate and regular rhythm.     Pulses: Normal pulses.     Heart sounds: Normal heart sounds.  Pulmonary:     Effort: Pulmonary effort is normal.     Breath sounds: Normal breath sounds.  Neurological:     General: No focal deficit present.     Mental Status: He is alert and oriented to person, place, and time.  Psychiatric:        Mood and Affect: Mood normal.        Behavior: Behavior normal.    LABORATORY DATA:  I have reviewed the labs as listed.  CBC Latest Ref Rng & Units 05/13/2021 04/29/2021 04/22/2021  WBC 4.0 - 10.5 K/uL 4.2 4.4 4.8  Hemoglobin 13.0 - 17.0 g/dL 11.9(L) 12.3(L) 11.7(L)  Hematocrit 39.0 - 52.0 % 35.8(L) 35.6(L) 35.7(L)  Platelets 150 - 400 K/uL 250 334 262   CMP Latest Ref Rng & Units 05/13/2021 04/29/2021 04/22/2021  Glucose 70 - 99 mg/dL 167(H) 123(H) 132(H)  BUN 8 - 23 mg/dL _0 Creatinine 0.61 - 1.24 mg/dL 0.96 1.01 0.99  Sodium 135 - 145 mmol/L 135 135 137  Potassium 3.5 - 5.1 mmol/L 3.9 3.8 3.8  Chloride 98 - 111 mmol/L 108 104 109  CO2 22 - 32 mmol/L 19(L) 23 21(L)  Calcium 8.9 - 10.3 mg/dL 8.7(L) 8.8(L) 8.8(L)  Total Protein 6.5 - 8.1 g/dL 6.3(L) 6.8 6.7  Total Bilirubin 0.3 -  1.2 mg/dL 0.8 0.3 0.4  Alkaline Phos 38 - 126 U/L 82 83 81  AST 15 - 41 U/L _1 ALT 0 - 44 U/L _2 DIAGNOSTIC IMAGING:  I have independently reviewed the scans and discussed with the patient. CT CHEST ABDOMEN PELVIS W CONTRAST  Result Date: 05/08/2021 CLINICAL DATA:  71 year old male with history of metastatic urothelial carcinoma undergoing ongoing chemotherapy. Follow-up study. EXAM: CT CHEST, ABDOMEN, AND PELVIS WITH CONTRAST TECHNIQUE: Multidetector CT imaging of the chest, abdomen and pelvis was performed following the standard protocol during bolus administration of intravenous contrast. RADIATION DOSE REDUCTION: This exam was performed according to the departmental dose-optimization program which includes automated exposure control, adjustment of the mA and/or kV according to patient size and/or use of iterative reconstruction technique. CONTRAST:  121m OMNIPAQUE IOHEXOL 300 MG/ML  SOLN COMPARISON:  CT the chest, abdomen and pelvis 02/05/2021. FINDINGS: CT CHEST FINDINGS Cardiovascular: Heart size is normal. There is no significant pericardial fluid, thickening or pericardial calcification. Atherosclerotic calcifications in the thoracic aorta. No definite coronary artery calcifications. Mediastinum/Nodes: No pathologically enlarged mediastinal or hilar lymph nodes. Esophagus is unremarkable in appearance. No axillary lymphadenopathy. Lungs/Pleura: No suspicious appearing pulmonary nodules or masses are noted. No acute consolidative airspace disease. No pleural effusions. Mild scarring in the inferior segment of the lingula. Musculoskeletal: There are no aggressive appearing lytic or blastic lesions noted in the visualized portions of the skeleton. CT ABDOMEN PELVIS FINDINGS Hepatobiliary: Numerous hypovascular hepatic lesions appear generally similar in number and size compared to the prior examination, although these have changed qualitatively in appearance, with the vast majority of  these lesions demonstrating far more hypovascular and centrally low-attenuation than previously noted. The largest lesion on today's study is an infiltrative lesion in the central aspect of the liver which  appears to partially invade the intrahepatic portion of the inferior vena cava (coronal image 74 of series 3 and axial image 53 of series 2) measuring 4.1 x 3.0 x 3.3 cm. Another prominent lesion in segment 8 of the liver (axial image 53 of series 2) currently measures 3.4 x 3.3 cm (previously 3.9 x 3.7 cm) and is far more centrally low-attenuation than the prior study. Another lesion in segment 4B of the liver (axial image 61 of series 2) has also slightly decreased in size compared to the prior examination, currently measuring 2.9 x 2.1 cm (previously 2.9 x 2.7 cm), and is far more centrally low-attenuation. No definite new hepatic lesions. There is a small amount of intrahepatic biliary ductal dilatation which appears isolated predominantly in segment 4A of the liver, likely related to some central obstruction from the lesion in segment 4A described above. No extrahepatic biliary ductal dilatation. Gallbladder is normal in appearance. Pancreas: No pancreatic mass. No pancreatic ductal dilatation. No pancreatic or peripancreatic fluid collections or inflammatory changes. Spleen: Unremarkable. Adrenals/Urinary Tract: Right kidney and right adrenal gland are normal in appearance. Infiltrative heterogeneously enhancing lesion in the left kidney which extends into the adjacent portions of the left retroperitoneum and invades the crus of the left hemidiaphragm, poorly defined and therefore difficult to accurately measure, but estimated to measure approximately 7.8 x 3.1 x 6.1 cm (axial image 58 of series 2 and coronal image 102 of series 3). This lesion again appears to extend into the left renal vein, and is diffusely infiltrative throughout the interpolar and upper pole region of the left kidney. 3 mm  nonobstructive calculus in the lower pole collecting system of left kidney. No hydroureteronephrosis. Urinary bladder is normal in appearance. Left adrenal gland is not confidently identified and is likely encapsulated by the malignant left renal neoplasm. Stomach/Bowel: The appearance of the stomach is normal. There is no pathologic dilatation of small bowel or colon. The appendix is not confidently identified and may be surgically absent. Regardless, there are no inflammatory changes noted adjacent to the cecum to suggest the presence of an acute appendicitis at this time. Vascular/Lymphatic: Aortic atherosclerosis, without evidence of aneurysm or dissection in the abdominal or pelvic vasculature. There are multiple prominent borderline enlarged retroperitoneal lymph nodes, but no definite pathologically enlarged lymph nodes confidently identified in the abdomen or pelvis. Reproductive: Prostate gland and seminal vesicles are unremarkable in appearance. Other: No significant volume of ascites.  No pneumoperitoneum. Musculoskeletal: There are no aggressive appearing lytic or blastic lesions noted in the visualized portions of the skeleton. IMPRESSION: 1. There are qualitative changes in numerous intrahepatic metastases, favored to reflect a positive response to therapy with developing areas of internal necrosis within the metastatic lesions. No new intrahepatic metastases are noted. 2. Infiltrative mass in the left kidney similar to the prior examination. This again encases the left adrenal gland and directly invades the crus of the left hemidiaphragm. 3. Previously noted left hilar lymphadenopathy has resolved. 4. Aortic atherosclerosis. 5. 3 mm nonobstructive calculus in the lower pole collecting system of the left kidney. 6. Additional incidental findings, as above. Electronically Signed   By: Vinnie Langton M.D.   On: 05/08/2021 16:05     ASSESSMENT:  1. Metastatic urothelial carcinoma arising in the renal  pelvis: - Patient evaluated at the request of Roseanne Kaufman for evaluation of left kidney and liver masses. - 20 pound weight loss in the last 4 months due to decreased appetite.  Left upper quadrant pain for  the past 2 to 3 months. - CTAP with contrast on 07/10/2020 showed ill-defined low-density mass in the right hepatic lobe measuring 2 x 2 cm.  Left upper pole kidney mass measuring 7.6 x 5.9 cm with encasement and narrowing of the left renal artery and vein.  No intravascular thrombus noted.  Mass encases the left adrenal gland.  Small right adrenal nodule measuring 11 mm, likely benign.  No other evidence of metastatic disease. - MRI of the liver with and without contrast on 07/20/2020 shows 2.2 x 1.7 cm enhancing lesion in the medial aspect of the segment 7, suspicious for metastasis.  Left renal mass, 6 x 7.6 x 6.9 cm infiltrating enhancing left upper pole renal mass, extending into the left renal sinus.  Possible TCC or lymphoma rather than renal cell carcinoma. - CT chest on 07/20/2020 with no evidence of metastatic disease. - PET scan on 08/04/2020 showed large hypermetabolic left upper pole mass and 2 hypermetabolic lesions in the right hepatic lobe favoring metastatic disease.  No hypermetabolic adenopathy.  - Liver biopsy showed poorly differentiated metastatic urothelial carcinoma.  IHC positive for CK5/6, CK7, GATA3, p40 and patchy positivity with p63 and PAX8. - He refused any chemotherapy. - 4 cycles of pembrolizumab from 08/20/2020 through 10/22/2020 with progression. - NGS testing showed benefit from pembrolizumab.  No mutations involving FGFR, NTRK was found.  Test was limited due to small sample. - CT CAP on 11/11/2020 showed interval progression of liver metastasis.  No significant change infiltrative mass arising in the upper pole of the left kidney invading the medial aspect of the upper spleen, left renal vein and left adrenal gland with soft tissue infiltration to the left retroperitoneal  fat.  No evidence of metastatic disease to the chest. - Progression on 3 cycles of enfortumab vedotin. - CT CAP on 02/15/2021 showed decrease in size of the left renal mass.  Interval progression of numerous liver metastasis. - Guardant 360 results-KRAS G12D, PDGFRA T230T, DDR2 [id number], MSI high not detected.   2.  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 - Reports exposure to carbon black dust.  Smoked half pack per day for 30 years, currently smoking 2 to 3 cigarettes/day. - No family history of malignancy.   PLAN:  1. Metastatic poorly differentiated urothelial carcinoma arising in the renal pelvis: - He has completed 4 cycles of sacituzumab govitecan. - Reviewed CT CAP from 05/06/2021.  There is improvement in liver metastatic disease.  Infiltrative mass in the left kidney similar to slightly improved from prior exam.  Previously noted left hilar lymphadenopathy has resolved. - As he had response to therapy, I have recommended treatment with sacituzumab govitecan until disease progression or poor tolerance. - He complained of blurring which lasts few hours after each infusion.  It is likely from dexamethasone.  Will discontinue dexamethasone.  He also has some constipation after treatment which is likely from Aloxi.  We will continue Aloxi at this time as it is not a big problem. - He was initially reluctant to consider continuing therapy.  After further discussion, he agreed.  We also talked about importance of port placement as he had some hardening of veins on the left forearm and wrist.  He has finally agreed.  We will make a referral to our surgical colleagues. - He will proceed with cycle 5-day 1 today.  RTC 3 weeks for follow-up.     2.  Left upper quadrant pain: - Continue ibuprofen  800 mg 2-3 times daily. - He is rarely requiring oxycodone, about once per week.  3.  Constipation: - Continue senna 2 tablets at bedtime.  He does  not report any problems with constipation as he is not taking much of oxycodone.  4.  Normocytic to microcytic anemia: - Hemoglobin today is 11.9.  No need for parenteral iron therapy.  5.  Loss of appetite: - He is not taking Megace.  He is continuing to gain weight.  6.  Anxiety: - Continue Xanax at bedtime as needed.   Orders placed this encounter:  No orders of the defined types were placed in this encounter.    Derek Jack, MD [place] [phone]   I, Thana Ates, am acting as a scribe for Dr. Derek Jack.  I, Derek Jack MD, have reviewed the above documentation for accuracy and completeness, and I agree with the above.

## 2021-05-13 NOTE — Patient Instructions (Addendum)
[place] at [facility] Discharge Instructions   You were seen and examined today by Dr. Delton Coombes.  He reviewed the results of your labs and CT scan.  Your lab work is normal/stable.  CT scan shows improvement of the cancer. We should continue the treatment you have been receiving since it is working to control and shrink your cancer. We will repeat scans again after 3-4 more cycles of this treatment.   We will proceed with your treatment today.   We discussed you having a port a cath placed since your veins are starting to be affected by the infusions.   Return as scheduled for lab work, office visit, and treatment.    Thank you for choosing [place] at [facility] to provide your oncology and hematology care.  To afford each patient quality time with our provider, please arrive at least 15 minutes before your scheduled appointment time.   If you have a lab appointment with the [facility] please come in thru the Main Entrance and check in at the main information desk.  You need to re-schedule your appointment should you arrive 10 or more minutes late.  We strive to give you quality time with our providers, and arriving late affects you and other patients whose appointments are after yours.  Also, if you no show three or more times for appointments you may be dismissed from the clinic at the providers discretion.     Again, thank you for choosing [facility].  Our hope is that these requests will decrease the amount of time that you wait before being seen by our physicians.       _____________________________________________________________  Should you have questions after your visit to [facility], please contact our office at [phone] and follow the prompts.  Our office hours are 8:00 a.m. and 4:30 p.m. Monday - Friday.  Please note that voicemails left after 4:00 p.m. may not be returned until the  following business day.  We are closed weekends and major holidays.  You do have access to a nurse 24-7, just call the main number to the clinic [phone] and do not press any options, hold on the line and a nurse will answer the phone.    For prescription refill requests, have your pharmacy contact our office and allow 72 hours.    Due to Covid, you will need to wear a mask upon entering the hospital. If you do not have a mask, a mask will be given to you at the Main Entrance upon arrival. For doctor visits, patients may have 1 support person age 110 or older with them. For treatment visits, patients can not have anyone with them due to social distancing guidelines and our immunocompromised population.

## 2021-05-13 NOTE — Progress Notes (Signed)
Patient has been examined by Dr. Katragadda, and vital signs and labs have been reviewed. ANC, Creatinine, LFTs, hemoglobin, and platelets are within treatment parameters per M.D. - pt may proceed with treatment.    °

## 2021-05-19 ENCOUNTER — Encounter: Payer: Self-pay | Admitting: Surgery

## 2021-05-19 ENCOUNTER — Ambulatory Visit: Payer: Medicare Other | Admitting: Surgery

## 2021-05-19 ENCOUNTER — Other Ambulatory Visit: Payer: Self-pay

## 2021-05-19 VITALS — BP 127/77 | HR 92 | Temp 98.3°F | Resp 14 | Ht 65.0 in | Wt 152.0 lb

## 2021-05-19 DIAGNOSIS — C7910 Secondary malignant neoplasm of unspecified urinary organs: Secondary | ICD-10-CM | POA: Diagnosis not present

## 2021-05-19 NOTE — Progress Notes (Signed)
[facility] History and Physical  Reason for Referral: Port-A-Cath insertion Referring Physician: Derek Jack, MD  Chief Complaint   New Patient (Initial Visit)     Jeremy Rodriguez is a 71 y.o. male.  HPI: Patient presents for evaluation of Port-A-Cath insertion.  He was diagnosed with metastatic urothelial carcinoma on 08/13/2020, and he has undergone numerous rounds of chemotherapy.  Throughout his chemotherapy treatment, he has had increasing difficulty with IV access.  He denies any history of procedures to neck vasculature.  Other than his cancer diagnosis, he states that he is relatively healthy.  He takes half a Xanax at night and occasionally takes ibuprofen for pain.  He smokes 1 pack of cigarettes per week and occasionally drinks a few beers a month.  He denies use of illicit drugs and blood thinning medications.  Past Medical History:  Diagnosis Date   Chronic kidney disease    GERD (gastroesophageal reflux disease)    Hypertension    Medical history non-contributory     Past Surgical History:  Procedure Laterality Date   appendectomy      Family History  Problem Relation Age of Onset   Colon cancer Neg Hx    Colon polyps Neg Hx    Liver cancer Neg Hx    Pancreatic cancer Neg Hx     Social History   Tobacco Use   Smoking status: Some Days   Smokeless tobacco: Never   Tobacco comments:    a few cigarettes a day  Substance Use Topics   Alcohol use: Yes    Comment: occ   Drug use: Never    Medications: I have reviewed the patient's current medications. Allergies as of 05/19/2021   No Known Allergies      Medication List        Accurate as of May 19, 2021  3:10 PM. If you have any questions, ask your nurse or doctor.          ALPRAZolam 1 MG tablet Commonly known as: Xanax Take 1 tablet (1 mg total) by mouth 2 (two) times daily.   dicyclomine 10 MG capsule Commonly known as: BENTYL Take 1 capsule (10 mg total)  by mouth 3 (three) times daily as needed for spasms.   docusate sodium 100 MG capsule Commonly known as: Colace Take 1 capsule (100 mg total) by mouth daily as needed for mild constipation.   ibuprofen 800 MG tablet Commonly known as: ADVIL Take 1 tablet (800 mg total) by mouth 2 (two) times daily as needed.   Lactulose 20 GM/30ML Soln Take 30 mLs (20 g total) by mouth daily.   linaclotide 145 MCG Caps capsule Commonly known as: Linzess Take 1 capsule (145 mcg total) by mouth daily before breakfast.   megestrol 400 MG/10ML suspension Commonly known as: MEGACE Take 10 mLs (400 mg total) by mouth daily.   Oxycodone HCl 20 MG Tabs Take 0.75 tablets (15 mg total) by mouth every 8 (eight) hours as needed.   pantoprazole 40 MG tablet Commonly known as: PROTONIX Take 1 tablet (40 mg total) by mouth daily.   prochlorperazine 10 MG tablet Commonly known as: COMPAZINE Take 1 tablet (10 mg total) by mouth every 6 (six) hours as needed for nausea or vomiting.   tadalafil 10 MG tablet Commonly known as: Cialis Take 2 tablets (20 mg total) by mouth daily as needed for erectile dysfunction.   zolpidem 10 MG tablet Commonly known as: AMBIEN Take 1 tablet (10 mg total) by  mouth at bedtime as needed for sleep.         ROS:  Constitutional: negative for chills, fatigue, and fevers Eyes: negative for visual disturbance and pain Ears, nose, mouth, throat, and face: negative for ear drainage, sore throat, and sinus problems Respiratory: negative for cough, wheezing, and shortness of breath Cardiovascular: negative for chest pain and palpitations Gastrointestinal: negative for abdominal pain, nausea, reflux symptoms, and vomiting Genitourinary:negative for dysuria, frequency, and urinary retention Integument/breast: negative for dryness and rash Hematologic/lymphatic: negative for bleeding and lymphadenopathy Musculoskeletal:negative for back pain, neck pain, and joint  pain Neurological: negative for dizziness, tremors, and numbness Endocrine: negative for temperature intolerance  Blood pressure 127/77, pulse 92, temperature 98.3 F (36.8 C), temperature source Other (Comment), resp. rate 14, height 5\' 5"  (1.651 m), weight 152 lb (68.9 kg), SpO2 93 %. Physical Exam Vitals reviewed.  Constitutional:      Appearance: Normal appearance.  HENT:     Head: Normocephalic and atraumatic.  Eyes:     Extraocular Movements: Extraocular movements intact.     Pupils: Pupils are equal, round, and reactive to light.  Cardiovascular:     Rate and Rhythm: Normal rate and regular rhythm.  Pulmonary:     Effort: Pulmonary effort is normal.     Breath sounds: Normal breath sounds.  Abdominal:     General: Abdomen is flat. There is no distension.     Tenderness: There is no abdominal tenderness.  Musculoskeletal:        General: Normal range of motion.     Cervical back: Normal range of motion. No tenderness.  Lymphadenopathy:     Cervical: No cervical adenopathy.  Skin:    General: Skin is warm and dry.  Neurological:     General: No focal deficit present.     Mental Status: He is alert and oriented to person, place, and time.  Psychiatric:        Mood and Affect: Mood normal.        Behavior: Behavior normal.    Results: No results found for this or any previous visit (from the past 48 hour(s)).  No results found.   Assessment & Plan:  Jeremy Rodriguez is a 71 y.o. male with a diagnosis of metastatic urothelial carcinoma who presents to discuss Port-A-Cath insertion  -The risk and benefits of right internal jugular Port-A-Cath insertion were discussed, including but not limited to bleeding, infection, injury to surrounding structures, pneumothorax, and need for additional procedures.  After careful consideration, Jeremy Rodriguez has decided to proceed with Port-A-Cath insertion -Patient is neck scheduled for an infusion on 2/23 -Tentatively  scheduled for 2/22  All questions were answered to the satisfaction of the patient.  Graciella Freer, DO [facility] [address] [place], [place] [address] [phone] (office)

## 2021-05-20 ENCOUNTER — Inpatient Hospital Stay (HOSPITAL_COMMUNITY): Payer: Medicare Other

## 2021-05-20 VITALS — BP 141/74 | HR 70 | Temp 97.3°F | Resp 18 | Ht 65.0 in | Wt 152.8 lb

## 2021-05-20 DIAGNOSIS — C7900 Secondary malignant neoplasm of unspecified kidney and renal pelvis: Secondary | ICD-10-CM | POA: Diagnosis not present

## 2021-05-20 DIAGNOSIS — C787 Secondary malignant neoplasm of liver and intrahepatic bile duct: Secondary | ICD-10-CM | POA: Diagnosis not present

## 2021-05-20 DIAGNOSIS — C7910 Secondary malignant neoplasm of unspecified urinary organs: Secondary | ICD-10-CM

## 2021-05-20 DIAGNOSIS — C679 Malignant neoplasm of bladder, unspecified: Secondary | ICD-10-CM | POA: Diagnosis not present

## 2021-05-20 LAB — COMPREHENSIVE METABOLIC PANEL
ALT: 21 U/L (ref 0–44)
AST: 23 U/L (ref 15–41)
Albumin: 3.5 g/dL (ref 3.5–5.0)
Alkaline Phosphatase: 93 U/L (ref 38–126)
Anion gap: 12 (ref 5–15)
BUN: 14 mg/dL (ref 8–23)
CO2: 20 mmol/L — ABNORMAL LOW (ref 22–32)
Calcium: 8.9 mg/dL (ref 8.9–10.3)
Chloride: 106 mmol/L (ref 98–111)
Creatinine, Ser: 1.08 mg/dL (ref 0.61–1.24)
GFR, Estimated: >60 mL/min (ref >60)
Glucose, Bld: 152 mg/dL — ABNORMAL HIGH (ref 70–99)
Potassium: 4 mmol/L (ref 3.5–5.1)
Sodium: 138 mmol/L (ref 135–145)
Total Bilirubin: 0.4 mg/dL (ref 0.3–1.2)
Total Protein: 6.7 g/dL (ref 6.5–8.1)

## 2021-05-20 LAB — CBC WITH DIFFERENTIAL/PLATELET
Abs Immature Granulocytes: 0.02 10*3/uL (ref 0.00–0.07)
Basophils Absolute: 0 10*3/uL (ref 0.0–0.1)
Basophils Relative: 1 %
Eosinophils Absolute: 0.1 10*3/uL (ref 0.0–0.5)
Eosinophils Relative: 2 %
HCT: 35.4 % — ABNORMAL LOW (ref 39.0–52.0)
Hemoglobin: 11.9 g/dL — ABNORMAL LOW (ref 13.0–17.0)
Immature Granulocytes: 1 %
Lymphocytes Relative: 31 %
Lymphs Abs: 1.1 10*3/uL (ref 0.7–4.0)
MCH: 30.4 pg (ref 26.0–34.0)
MCHC: 33.6 g/dL (ref 30.0–36.0)
MCV: 90.3 fL (ref 80.0–100.0)
Monocytes Absolute: 0.5 10*3/uL (ref 0.1–1.0)
Monocytes Relative: 15 %
Neutro Abs: 1.8 10*3/uL (ref 1.7–7.7)
Neutrophils Relative %: 50 %
Platelets: 327 10*3/uL (ref 150–400)
RBC: 3.92 MIL/uL — ABNORMAL LOW (ref 4.22–5.81)
RDW: 16.2 % — ABNORMAL HIGH (ref 11.5–15.5)
WBC: 3.5 10*3/uL — ABNORMAL LOW (ref 4.0–10.5)
nRBC: 0 % (ref 0.0–0.2)

## 2021-05-20 LAB — MAGNESIUM: Magnesium: 2.2 mg/dL (ref 1.7–2.4)

## 2021-05-20 LAB — PHOSPHORUS: Phosphorus: 3.6 mg/dL (ref 2.5–4.6)

## 2021-05-20 MED ORDER — SODIUM CHLORIDE 0.9 % IV SOLN: 10.0000 mg | Freq: Once | INTRAVENOUS | Status: DC

## 2021-05-20 MED ORDER — ACETAMINOPHEN 325 MG PO TABS
650.0000 mg | ORAL_TABLET | Freq: Once | ORAL | Status: AC
  Administered 2021-05-20: 650 mg via ORAL
  Filled 2021-05-20: qty 2

## 2021-05-20 MED ORDER — SODIUM CHLORIDE 0.9 % IV SOLN
150.0000 mg | Freq: Once | INTRAVENOUS | Status: AC
  Administered 2021-05-20: 150 mg via INTRAVENOUS
  Filled 2021-05-20: qty 150

## 2021-05-20 MED ORDER — SODIUM CHLORIDE 0.9 % IV SOLN: Freq: Once | INTRAVENOUS | Status: AC

## 2021-05-20 MED ORDER — PALONOSETRON HCL INJECTION 0.25 MG/5ML
0.2500 mg | Freq: Once | INTRAVENOUS | Status: AC
  Administered 2021-05-20: 0.25 mg via INTRAVENOUS
  Filled 2021-05-20: qty 5

## 2021-05-20 MED ORDER — FAMOTIDINE IN NACL 20-0.9 MG/50ML-% IV SOLN
20.0000 mg | Freq: Once | INTRAVENOUS | Status: AC
  Administered 2021-05-20: 20 mg via INTRAVENOUS
  Filled 2021-05-20: qty 50

## 2021-05-20 MED ORDER — SODIUM CHLORIDE 0.9 % IV SOLN
10.0000 mg/kg | Freq: Once | INTRAVENOUS | Status: AC
  Administered 2021-05-20: 670 mg via INTRAVENOUS
  Filled 2021-05-20: qty 67

## 2021-05-20 NOTE — H&P (Signed)
[facility] History and Physical   Reason for Referral: Port-A-Cath insertion Referring Physician: Derek Jack, MD   Chief Complaint   New Patient (Initial Visit)        Jeremy Rodriguez is a 71 y.o. male.  HPI: Patient presents for evaluation of Port-A-Cath insertion.  He was diagnosed with metastatic urothelial carcinoma on 08/13/2020, and he has undergone numerous rounds of chemotherapy.  Throughout his chemotherapy treatment, he has had increasing difficulty with IV access.  He denies any history of procedures to neck vasculature.  Other than his cancer diagnosis, he states that he is relatively healthy.  He takes half a Xanax at night and occasionally takes ibuprofen for pain.  He smokes 1 pack of cigarettes per week and occasionally drinks a few beers a month.  He denies use of illicit drugs and blood thinning medications.       Past Medical History:  Diagnosis Date   Chronic kidney disease     GERD (gastroesophageal reflux disease)     Hypertension     Medical history non-contributory             Past Surgical History:  Procedure Laterality Date   appendectomy               Family History  Problem Relation Age of Onset   Colon cancer Neg Hx     Colon polyps Neg Hx     Liver cancer Neg Hx     Pancreatic cancer Neg Hx        Social History         Tobacco Use   Smoking status: Some Days   Smokeless tobacco: Never   Tobacco comments:      a few cigarettes a day  Substance Use Topics   Alcohol use: Yes      Comment: occ   Drug use: Never      Medications: I have reviewed the patient's current medications. Allergies as of 05/19/2021   No Known Allergies         Medication List           Accurate as of May 19, 2021  3:10 PM. If you have any questions, ask your nurse or doctor.              ALPRAZolam 1 MG tablet Commonly known as: Xanax Take 1 tablet (1 mg total) by mouth 2 (two) times daily.    dicyclomine 10 MG  capsule Commonly known as: BENTYL Take 1 capsule (10 mg total) by mouth 3 (three) times daily as needed for spasms.    docusate sodium 100 MG capsule Commonly known as: Colace Take 1 capsule (100 mg total) by mouth daily as needed for mild constipation.    ibuprofen 800 MG tablet Commonly known as: ADVIL Take 1 tablet (800 mg total) by mouth 2 (two) times daily as needed.    Lactulose 20 GM/30ML Soln Take 30 mLs (20 g total) by mouth daily.    linaclotide 145 MCG Caps capsule Commonly known as: Linzess Take 1 capsule (145 mcg total) by mouth daily before breakfast.    megestrol 400 MG/10ML suspension Commonly known as: MEGACE Take 10 mLs (400 mg total) by mouth daily.    Oxycodone HCl 20 MG Tabs Take 0.75 tablets (15 mg total) by mouth every 8 (eight) hours as needed.    pantoprazole 40 MG tablet Commonly known as: PROTONIX Take 1 tablet (40 mg total) by mouth daily.  prochlorperazine 10 MG tablet Commonly known as: COMPAZINE Take 1 tablet (10 mg total) by mouth every 6 (six) hours as needed for nausea or vomiting.    tadalafil 10 MG tablet Commonly known as: Cialis Take 2 tablets (20 mg total) by mouth daily as needed for erectile dysfunction.    zolpidem 10 MG tablet Commonly known as: AMBIEN Take 1 tablet (10 mg total) by mouth at bedtime as needed for sleep.               ROS:  Constitutional: negative for chills, fatigue, and fevers Eyes: negative for visual disturbance and pain Ears, nose, mouth, throat, and face: negative for ear drainage, sore throat, and sinus problems Respiratory: negative for cough, wheezing, and shortness of breath Cardiovascular: negative for chest pain and palpitations Gastrointestinal: negative for abdominal pain, nausea, reflux symptoms, and vomiting Genitourinary:negative for dysuria, frequency, and urinary retention Integument/breast: negative for dryness and rash Hematologic/lymphatic: negative for bleeding and  lymphadenopathy Musculoskeletal:negative for back pain, neck pain, and joint pain Neurological: negative for dizziness, tremors, and numbness Endocrine: negative for temperature intolerance   Blood pressure 127/77, pulse 92, temperature 98.3 F (36.8 C), temperature source Other (Comment), resp. rate 14, height 5\' 5"  (1.651 m), weight 152 lb (68.9 kg), SpO2 93 %. Physical Exam Vitals reviewed.  Constitutional:      Appearance: Normal appearance.  HENT:     Head: Normocephalic and atraumatic.  Eyes:     Extraocular Movements: Extraocular movements intact.     Pupils: Pupils are equal, round, and reactive to light.  Cardiovascular:     Rate and Rhythm: Normal rate and regular rhythm.  Pulmonary:     Effort: Pulmonary effort is normal.     Breath sounds: Normal breath sounds.  Abdominal:     General: Abdomen is flat. There is no distension.     Tenderness: There is no abdominal tenderness.  Musculoskeletal:        General: Normal range of motion.     Cervical back: Normal range of motion. No tenderness.  Lymphadenopathy:     Cervical: No cervical adenopathy.  Skin:    General: Skin is warm and dry.  Neurological:     General: No focal deficit present.     Mental Status: He is alert and oriented to person, place, and time.  Psychiatric:        Mood and Affect: Mood normal.        Behavior: Behavior normal.      Results: Lab Results Last 48 Hours  No results found for this or any previous visit (from the past 48 hour(s)).     Imaging Results (Last 48 hours)  No results found.       Assessment & Plan:  Jeremy Rodriguez is a 71 y.o. male with a diagnosis of metastatic urothelial carcinoma who presents to discuss Port-A-Cath insertion   -The risk and benefits of right internal jugular Port-A-Cath insertion were discussed, including but not limited to bleeding, infection, injury to surrounding structures, pneumothorax, and need for additional procedures.  After careful  consideration, Jeremy Rodriguez has decided to proceed with Port-A-Cath insertion -Patient is neck scheduled for an infusion on 2/23 -Tentatively scheduled for 2/22   All questions were answered to the satisfaction of the patient.   Graciella Freer, DO [facility] [address] [place], [place] [address] [phone] (office)

## 2021-05-20 NOTE — Patient Instructions (Signed)
[facility]  Discharge Instructions: Thank you for choosing [facility] to provide your oncology and hematology care.  If you have a lab appointment with the Cancer Center, please come in thru the Main Entrance and check in at the main information desk.  Wear comfortable clothing and clothing appropriate for easy access to any Portacath or PICC line.   We strive to give you quality time with your provider. You may need to reschedule your appointment if you arrive late (15 or more minutes).  Arriving late affects you and other patients whose appointments are after yours.  Also, if you miss three or more appointments without notifying the office, you may be dismissed from the clinic at the provider's discretion.      For prescription refill requests, have your pharmacy contact our office and allow 72 hours for refills to be completed.        To help prevent nausea and vomiting after your treatment, we encourage you to take your nausea medication as directed.  BELOW ARE SYMPTOMS THAT SHOULD BE REPORTED IMMEDIATELY: *FEVER GREATER THAN 100.4 F (38 C) OR HIGHER *CHILLS OR SWEATING *NAUSEA AND VOMITING THAT IS NOT CONTROLLED WITH YOUR NAUSEA MEDICATION *UNUSUAL SHORTNESS OF BREATH *UNUSUAL BRUISING OR BLEEDING *URINARY PROBLEMS (pain or burning when urinating, or frequent urination) *BOWEL PROBLEMS (unusual diarrhea, constipation, pain near the anus) TENDERNESS IN MOUTH AND THROAT WITH OR WITHOUT PRESENCE OF ULCERS (sore throat, sores in mouth, or a toothache) UNUSUAL RASH, SWELLING OR PAIN  UNUSUAL VAGINAL DISCHARGE OR ITCHING   Items with * indicate a potential emergency and should be followed up as soon as possible or go to the Emergency Department if any problems should occur.  Please show the CHEMOTHERAPY ALERT CARD or IMMUNOTHERAPY ALERT CARD at check-in to the Emergency Department and triage nurse.  Should you have questions after your visit or need to cancel  or reschedule your appointment, please contact [facility] [phone]  and follow the prompts.  Office hours are 8:00 a.m. to 4:30 p.m. Monday - Friday. Please note that voicemails left after 4:00 p.m. may not be returned until the following business day.  We are closed weekends and major holidays. You have access to a nurse at all times for urgent questions. Please call the main number to the clinic [phone] and follow the prompts.  For any non-urgent questions, you may also contact your provider using MyChart. We now offer e-Visits for anyone 18 and older to request care online for non-urgent symptoms. For details visit [email or web address].   Also download the MyChart app! Go to the app store, search "MyChart", open the app, select [facility], and log in with your MyChart username and password.  Due to Covid, a mask is required upon entering the hospital/clinic. If you do not have a mask, one will be given to you upon arrival. For doctor visits, patients may have 1 support person aged 18 or older with them. For treatment visits, patients cannot have anyone with them due to current Covid guidelines and our immunocompromised population.  

## 2021-05-20 NOTE — Progress Notes (Signed)
Patient presents today for chemotherapy infusion.  Patient is in satisfactory condition with no complaints voiced.  Vital signs are stable.  Labs reviewed.  All labs are within treatment parameters.  We will proceed with treatment per MD orders.   Patient tolerated treatment well with no complaints voiced.  Patient left ambulatory in stable condition.  Vital signs stable at discharge.  Follow up as scheduled.    

## 2021-05-20 NOTE — Progress Notes (Signed)
Hold dexamethasone as premedication today.  T.O. Dr Rhys Martini, PharmD

## 2021-05-20 NOTE — Patient Instructions (Signed)
Jeremy Rodriguez  05/20/2021     @PREFPERIOPPHARMACY @   Your procedure is scheduled on 05/26/2021.  Report to [facility] at 12:30 A.M.  Call this number if you have problems the morning of surgery:  [phone]   Remember:  Do not eat or drink after midnight.     Take these medicines the morning of surgery with A SIP OF WATER : Xanax, Oxycodone, Protonix and Compazine(if Needed)    Do not wear jewelry, make-up or nail polish.  Do not wear lotions, powders, or perfumes, or deodorant.  Do not shave 48 hours prior to surgery.  Men may shave face and neck.  Do not bring valuables to the hospital.  [facility] is not responsible for any belongings or valuables.  Contacts, dentures or bridgework may not be worn into surgery.  Leave your suitcase in the car.  After surgery it may be brought to your room.  For patients admitted to the hospital, discharge time will be determined by your treatment team.  Patients discharged the day of surgery will not be allowed to drive home.   Name and phone number of your driver:   Family Special instructions N/A  Please read over the following fact sheets that you were given. Care and Recovery After Surgery  Implanted Port Insertion Implanted port insertion is a procedure to put in a port and catheter. The port is a device with an injectable disc that can be accessed by your health care provider. The port is connected to a vein in the chest or neck by a small, thin tube (catheter). There are different types of ports. The implanted port may be used as a long-term IV access for: Medicines, such as chemotherapy. Fluids. Liquid nutrition, such as total parenteral nutrition (TPN). When you have a port, your health care provider can choose to use the port instead of veins in your arms for these procedures. Tell a health care provider about: Any allergies you have. All medicines you are taking, especially blood thinners, as well as any vitamins,  herbs, eye drops, creams, over-the-counter medicines, and steroids. Any problems you or family members have had with anesthetic medicines. Any bleeding problems you have. Any surgeries you have had. Any medical conditions you have or have had, including diabetes or kidney problems. Whether you are pregnant or may be pregnant. What are the risks? Generally, this is a safe procedure. However, problems may occur, including: Allergic reactions to medicines or dyes. Damage to other structures or organs. Infection. Damage to the blood vessel, bruising, or bleeding at the puncture site. Blood clot. Breakdown of the skin over the port. A collection of air in the chest that can cause one of the lungs to collapse (pneumothorax). This is rare. What happens before the procedure? When to stop eating and drinking Follow instructions from your health care provider about what you may eat and drink before your procedure. These may include: 8 hours before your procedure Stop eating most foods. Do not eat meat, fried foods, or fatty foods. Eat only light foods, such as toast or crackers. All liquids are okay except energy drinks and alcohol. 6 hours before your procedure Stop eating. Drink only clear liquids, such as water, clear fruit juice, black coffee, plain tea, and sports drinks. Do not drink energy drinks or alcohol. 2 hours before your procedure Stop drinking all liquids. You may be allowed to take medicines with small sips of water. If you do not follow your health care provider's  instructions, your procedure may be delayed or canceled. Medicines Ask your health care provider about: Changing or stopping your regular medicines. This is especially important if you are taking diabetes medicines or blood thinners. Taking medicines such as aspirin and ibuprofen. These medicines can thin your blood. Do not take these medicines unless your health care provider tells you to take them. Taking  over-the-counter medicines, vitamins, herbs, and supplements. General instructions If you will be going home right after the procedure, plan to have a responsible adult: Take you home from the hospital or clinic. You will not be allowed to drive. Care for you for the time you are told. You may have blood tests. Do not use any products that contain nicotine or tobacco for at least 4 weeks before the procedure. These products include cigarettes, chewing tobacco, and vaping devices, such as e-cigarettes. If you need help quitting, ask your health care provider. Ask your health care provider what steps will be taken to help prevent infection. These may include: Removing hair at the surgery site. Washing skin with a germ-killing soap. Taking antibiotic medicine. What happens during the procedure?  An IV will be inserted into one of your veins. You will be given one or more of the following: A medicine to help you relax (sedative). A medicine to numb the area (local anesthetic). Two small incisions will be made to insert the port. One smaller incision will be made in your neck to get access to the vein where the catheter will lie. The other incision will be made in the upper chest. This is where the port will lie. The procedure may be done using continuous X-ray (fluoroscopy) or other imaging tools for guidance. The port and catheter will be placed. There may be a small, raised area where the port is placed. The port will be flushed with a saline solution, which is made of salt and water, and blood will be drawn to make sure that the port is working correctly. The incisions will be closed. Bandages (dressings) may be placed over the incisions. The procedure may vary among health care providers and hospitals. What happens after the procedure? Your blood pressure, heart rate, breathing rate, and blood oxygen level will be monitored until you leave the hospital or clinic. If you were given a  sedative during the procedure, it can affect you for several hours. Do not drive or operate machinery until your health care provider says that it is safe. You will be given a manufacturer's information card for the type of port that you have. Keep this with you. Your port will need to be flushed and checked as told by your health care provider, usually every few weeks. A chest X-ray will be done to: Check the placement of the port. Make sure there is no injury to your lung. Summary Implanted port insertion is a procedure to put in a port and catheter. The implanted port is used as a long-term IV access. The port will need to be flushed and checked as told by your health care provider, usually every few weeks. Keep your manufacturer's information card with you at all times. This information is not intended to replace advice given to you by your health care provider. Make sure you discuss any questions you have with your health care provider. Document Revised: 09/22/2020 Document Reviewed: 09/22/2020 Elsevier Patient Education  [place].

## 2021-05-21 ENCOUNTER — Other Ambulatory Visit: Payer: Self-pay

## 2021-05-21 ENCOUNTER — Encounter (HOSPITAL_COMMUNITY)
Admission: RE | Admit: 2021-05-21 | Discharge: 2021-05-21 | Disposition: A | Discharge status: Home / Self Care | Payer: Medicare Other | Source: Ambulatory Visit | Attending: Surgery | Admitting: Surgery

## 2021-05-21 ENCOUNTER — Encounter (HOSPITAL_COMMUNITY): Payer: Self-pay

## 2021-05-26 ENCOUNTER — Ambulatory Visit (HOSPITAL_BASED_OUTPATIENT_CLINIC_OR_DEPARTMENT_OTHER): Payer: Medicare Other | Admitting: Anesthesiology

## 2021-05-26 ENCOUNTER — Encounter (HOSPITAL_COMMUNITY)
Admission: RE | Disposition: A | Discharge status: Home / Self Care | Payer: Self-pay | Source: Home / Self Care | Attending: Surgery

## 2021-05-26 ENCOUNTER — Ambulatory Visit (HOSPITAL_COMMUNITY)
Admission: RE | Admit: 2021-05-26 | Discharge: 2021-05-26 | Disposition: A | Discharge status: Home / Self Care | Payer: Medicare Other | Attending: Surgery | Admitting: Surgery

## 2021-05-26 ENCOUNTER — Ambulatory Visit (HOSPITAL_COMMUNITY): Payer: Medicare Other

## 2021-05-26 ENCOUNTER — Ambulatory Visit (HOSPITAL_COMMUNITY): Payer: Medicare Other | Admitting: Anesthesiology

## 2021-05-26 ENCOUNTER — Other Ambulatory Visit: Payer: Self-pay

## 2021-05-26 ENCOUNTER — Encounter (HOSPITAL_COMMUNITY): Payer: Self-pay | Admitting: Surgery

## 2021-05-26 DIAGNOSIS — C679 Malignant neoplasm of bladder, unspecified: Secondary | ICD-10-CM | POA: Diagnosis not present

## 2021-05-26 DIAGNOSIS — Z9221 Personal history of antineoplastic chemotherapy: Secondary | ICD-10-CM | POA: Diagnosis not present

## 2021-05-26 DIAGNOSIS — D649 Anemia, unspecified: Secondary | ICD-10-CM | POA: Diagnosis not present

## 2021-05-26 DIAGNOSIS — C7910 Secondary malignant neoplasm of unspecified urinary organs: Secondary | ICD-10-CM

## 2021-05-26 DIAGNOSIS — F17210 Nicotine dependence, cigarettes, uncomplicated: Secondary | ICD-10-CM | POA: Diagnosis not present

## 2021-05-26 DIAGNOSIS — D759 Disease of blood and blood-forming organs, unspecified: Secondary | ICD-10-CM | POA: Diagnosis not present

## 2021-05-26 DIAGNOSIS — C799 Secondary malignant neoplasm of unspecified site: Secondary | ICD-10-CM

## 2021-05-26 DIAGNOSIS — I10 Essential (primary) hypertension: Secondary | ICD-10-CM | POA: Insufficient documentation

## 2021-05-26 DIAGNOSIS — C680 Malignant neoplasm of urethra: Secondary | ICD-10-CM | POA: Diagnosis not present

## 2021-05-26 DIAGNOSIS — D630 Anemia in neoplastic disease: Secondary | ICD-10-CM | POA: Diagnosis not present

## 2021-05-26 DIAGNOSIS — Z452 Encounter for adjustment and management of vascular access device: Secondary | ICD-10-CM | POA: Insufficient documentation

## 2021-05-26 DIAGNOSIS — K219 Gastro-esophageal reflux disease without esophagitis: Secondary | ICD-10-CM | POA: Insufficient documentation

## 2021-05-26 DIAGNOSIS — Z95828 Presence of other vascular implants and grafts: Secondary | ICD-10-CM

## 2021-05-26 DIAGNOSIS — S2242XA Multiple fractures of ribs, left side, initial encounter for closed fracture: Secondary | ICD-10-CM | POA: Diagnosis not present

## 2021-05-26 DIAGNOSIS — C689 Malignant neoplasm of urinary organ, unspecified: Secondary | ICD-10-CM | POA: Diagnosis not present

## 2021-05-26 DIAGNOSIS — M19011 Primary osteoarthritis, right shoulder: Secondary | ICD-10-CM | POA: Diagnosis not present

## 2021-05-26 HISTORY — PX: PORTACATH PLACEMENT: SHX2246

## 2021-05-26 SURGERY — INSERTION, TUNNELED CENTRAL VENOUS DEVICE, WITH PORT
Anesthesia: General | Site: Chest | Laterality: Right

## 2021-05-26 MED ORDER — LIDOCAINE HCL (PF) 1 % IJ SOLN
INTRAMUSCULAR | Status: AC
  Filled 2021-05-26: qty 30

## 2021-05-26 MED ORDER — CEFAZOLIN SODIUM-DEXTROSE 2-4 GM/100ML-% IV SOLN
2.0000 g | INTRAVENOUS | Status: AC
  Administered 2021-05-26: 2 g via INTRAVENOUS

## 2021-05-26 MED ORDER — FENTANYL CITRATE (PF) 100 MCG/2ML IJ SOLN
INTRAMUSCULAR | Status: DC | PRN
  Administered 2021-05-26 (×4): 25 ug via INTRAVENOUS

## 2021-05-26 MED ORDER — FENTANYL CITRATE (PF) 100 MCG/2ML IJ SOLN
INTRAMUSCULAR | Status: AC
  Filled 2021-05-26: qty 2

## 2021-05-26 MED ORDER — CHLORHEXIDINE GLUCONATE 0.12 % MT SOLN
15.0000 mL | Freq: Once | OROMUCOSAL | Status: AC
  Administered 2021-05-26: 15 mL via OROMUCOSAL

## 2021-05-26 MED ORDER — ACETAMINOPHEN 500 MG PO TABS: 1000.0000 mg | ORAL_TABLET | Freq: Four times a day (QID) | ORAL | 0 refills | Status: AC

## 2021-05-26 MED ORDER — HEPARIN SOD (PORK) LOCK FLUSH 100 UNIT/ML IV SOLN
INTRAVENOUS | Status: DC | PRN
Start: 2021-05-26 — End: 2021-05-26
  Administered 2021-05-26: 500 [IU] via INTRAVENOUS

## 2021-05-26 MED ORDER — CEFAZOLIN SODIUM-DEXTROSE 2-4 GM/100ML-% IV SOLN
INTRAVENOUS | Status: AC
  Filled 2021-05-26: qty 100

## 2021-05-26 MED ORDER — LIDOCAINE HCL (PF) 1 % IJ SOLN
INTRAMUSCULAR | Status: DC | PRN
  Administered 2021-05-26: 13 mL

## 2021-05-26 MED ORDER — PROPOFOL 10 MG/ML IV BOLUS
INTRAVENOUS | Status: DC | PRN
  Administered 2021-05-26: 40 mg via INTRAVENOUS

## 2021-05-26 MED ORDER — PROPOFOL 10 MG/ML IV BOLUS
INTRAVENOUS | Status: AC
  Filled 2021-05-26: qty 20

## 2021-05-26 MED ORDER — SODIUM CHLORIDE (PF) 0.9 % IJ SOLN
INTRAMUSCULAR | Status: DC | PRN
  Administered 2021-05-26: 500 mL via INTRAVENOUS

## 2021-05-26 MED ORDER — CHLORHEXIDINE GLUCONATE CLOTH 2 % EX PADS: 6.0000 | MEDICATED_PAD | Freq: Once | CUTANEOUS | Status: DC

## 2021-05-26 MED ORDER — HEPARIN SOD (PORK) LOCK FLUSH 100 UNIT/ML IV SOLN
INTRAVENOUS | Status: AC
  Filled 2021-05-26: qty 5

## 2021-05-26 MED ORDER — ORAL CARE MOUTH RINSE: 15.0000 mL | Freq: Once | OROMUCOSAL | Status: AC

## 2021-05-26 MED ORDER — PROPOFOL 500 MG/50ML IV EMUL
INTRAVENOUS | Status: DC | PRN
  Administered 2021-05-26: 50 ug/kg/min via INTRAVENOUS

## 2021-05-26 MED ORDER — CHLORHEXIDINE GLUCONATE 0.12 % MT SOLN
OROMUCOSAL | Status: AC
  Filled 2021-05-26: qty 15

## 2021-05-26 MED ORDER — LACTATED RINGERS IV SOLN
INTRAVENOUS | Status: DC
  Administered 2021-05-26: 1000 mL via INTRAVENOUS

## 2021-05-26 SURGICAL SUPPLY — 35 items
ADH SKN CLS APL DERMABOND .7 (GAUZE/BANDAGES/DRESSINGS) ×1
APL PRP STRL LF ISPRP CHG 10.5 (MISCELLANEOUS) ×2
APPLICATOR CHLORAPREP 10.5 ORG (MISCELLANEOUS) ×3 IMPLANT
APPLIER CLIP 9.375 SM OPEN (CLIP)
APR CLP SM 9.3 20 MLT OPN (CLIP)
BAG DECANTER FOR FLEXI CONT (MISCELLANEOUS) ×2 IMPLANT
CLIP APPLIE 9.375 SM OPEN (CLIP) IMPLANT
CLOTH BEACON ORANGE TIMEOUT ST (SAFETY) ×2 IMPLANT
COVER LIGHT HANDLE STERIS (MISCELLANEOUS) ×4 IMPLANT
COVER PROBE U/S 5X48 (MISCELLANEOUS) ×2 IMPLANT
DECANTER SPIKE VIAL GLASS SM (MISCELLANEOUS) ×2 IMPLANT
DERMABOND ADVANCED (GAUZE/BANDAGES/DRESSINGS) ×1
DERMABOND ADVANCED .7 DNX12 (GAUZE/BANDAGES/DRESSINGS) ×1 IMPLANT
DRAPE C-ARM FOLDED MOBILE STRL (DRAPES) ×2 IMPLANT
ELECT REM PT RETURN 9FT ADLT (ELECTROSURGICAL) ×2
ELECTRODE REM PT RTRN 9FT ADLT (ELECTROSURGICAL) ×1 IMPLANT
GLOVE SURG ENC MOIS LTX SZ6.5 (GLOVE) ×2 IMPLANT
GLOVE SURG UNDER POLY LF SZ6.5 (GLOVE) ×1 IMPLANT
GLOVE SURG UNDER POLY LF SZ7 (GLOVE) ×4 IMPLANT
GOWN STRL REUS W/TWL LRG LVL3 (GOWN DISPOSABLE) ×4 IMPLANT
IV NS 500ML (IV SOLUTION) ×2
IV NS 500ML BAXH (IV SOLUTION) ×1 IMPLANT
KIT PORT POWER 8FR ISP MRI (Port) ×2 IMPLANT
KIT TURNOVER KIT A (KITS) ×2 IMPLANT
MANIFOLD NEPTUNE II (INSTRUMENTS) ×2 IMPLANT
NDL HYPO 25X1 1.5 SAFETY (NEEDLE) ×1 IMPLANT
NEEDLE HYPO 25X1 1.5 SAFETY (NEEDLE) ×2 IMPLANT
PACK MINOR (CUSTOM PROCEDURE TRAY) ×2 IMPLANT
PAD ARMBOARD 7.5X6 YLW CONV (MISCELLANEOUS) ×2 IMPLANT
SET BASIN LINEN APH (SET/KITS/TRAYS/PACK) ×2 IMPLANT
SUT MNCRL AB 4-0 PS2 18 (SUTURE) ×2 IMPLANT
SUT VIC AB 3-0 SH 27 (SUTURE) ×2
SUT VIC AB 3-0 SH 27X BRD (SUTURE) ×1 IMPLANT
SYR 10ML LL (SYRINGE) ×4 IMPLANT
SYR CONTROL 10ML LL (SYRINGE) ×2 IMPLANT

## 2021-05-26 NOTE — Transfer of Care (Signed)
Immediate Anesthesia Transfer of Care Note  Patient: Jeremy Rodriguez  Procedure(s) Performed: INSERTION PORT-A-CATH- IJ (Right: Chest)  Patient Location: Short Stay  Anesthesia Type:General  Level of Consciousness: awake  Airway & Oxygen Therapy: Patient Spontanous Breathing  Post-op Assessment: Report given to RN and Post -op Vital signs reviewed and stable  Post vital signs: Reviewed and stable  Last Vitals:  Vitals Value Taken Time  BP 98/54 05/26/21 0929  Temp    Pulse 63 05/26/21 0927  Resp 16 05/26/21 0927  SpO2 96 % 05/26/21 0927    Last Pain:  Vitals:   05/26/21 0927  TempSrc:   PainSc: Asleep      Patients Stated Pain Goal: 4 (45/91/36 8599)  Complications: No notable events documented.

## 2021-05-26 NOTE — Progress Notes (Signed)
Update Note:  Attempted to call the patient's neighbor, Rosiland Oz, without answer.  I updated the patient regarding his procedure.  It was explained that his port-a-cath was able to be placed without issue or difficulty.  His post-operative CXR looks good with appropriate position of the port and no pneumothorax.  He was not given a prescription of narcotic pain medications, as he currently has a prescription of oxycodone.  He will have a phone follow up with me in 2 weeks.  Graciella Freer, DO [facility] [address] [place], [place] [address] [phone] (office)

## 2021-05-26 NOTE — Anesthesia Preprocedure Evaluation (Signed)
Anesthesia Evaluation  Patient identified by MRN, date of birth, ID band Patient awake    Reviewed: Allergy & Precautions, NPO status , Patient's Chart, lab work & pertinent test results  Airway Mallampati: II  TM Distance: >3 FB Neck ROM: Full    Dental  (+) Dental Advisory Given, Implants   Pulmonary Current Smoker and Patient abstained from smoking.,    Pulmonary exam normal breath sounds clear to auscultation       Cardiovascular hypertension, Pt. on medications Normal cardiovascular exam Rhythm:Regular Rate:Normal     Neuro/Psych negative neurological ROS  negative psych ROS   GI/Hepatic Neg liver ROS, GERD  Medicated,  Endo/Other  negative endocrine ROS  Renal/GU Renal disease Bladder dysfunction (metastaitc urothelial cancer)      Musculoskeletal negative musculoskeletal ROS (+)   Abdominal   Peds negative pediatric ROS (+)  Hematology  (+) Blood dyscrasia, anemia ,   Anesthesia Other Findings   Reproductive/Obstetrics negative OB ROS                             Anesthesia Physical Anesthesia Plan  ASA: 3  Anesthesia Plan: General   Post-op Pain Management: Minimal or no pain anticipated   Induction:   PONV Risk Score and Plan: 2 and Ondansetron  Airway Management Planned: Nasal Cannula and Natural Airway  Additional Equipment:   Intra-op Plan:   Post-operative Plan:   Informed Consent: I have reviewed the patients History and Physical, chart, labs and discussed the procedure including the risks, benefits and alternatives for the proposed anesthesia with the patient or authorized representative who has indicated his/her understanding and acceptance.     Dental advisory given  Plan Discussed with: CRNA and Surgeon  Anesthesia Plan Comments:         Anesthesia Quick Evaluation

## 2021-05-26 NOTE — Interval H&P Note (Signed)
History and Physical Interval Note:  05/26/2021 8:24 AM  Jeremy Rodriguez  has presented today for surgery, with the diagnosis of METASTATIC UROTHELIAL CARCINOMA.  The various methods of treatment have been discussed with the patient and family. After consideration of risks, benefits and other options for treatment, the patient has consented to  Procedure(s): INSERTION PORT-A-CATH- IJ (Right) as a surgical intervention.  The patient's history has been reviewed, patient examined, no change in status, stable for surgery.  I have reviewed the patient's chart and labs.  Questions were answered to the patient's satisfaction.     [place]

## 2021-05-26 NOTE — Discharge Instructions (Signed)
Ambulatory Surgery Discharge Instructions  General Anesthesia or Sedation Do not drive or operate heavy machinery for 24 hours.  Do not consume alcohol, tranquilizers, sleeping medications, or any non-prescribed medications for 24 hours. Do not make important decisions or sign any important papers in the next 24 hours. You should have someone with you tonight at home.  Activity  You are advised to go directly home from the hospital.  Restrict your activities and rest for a day.  Resume light activity tomorrow. No heavy lifting over 10 lbs or strenuous exercise.  Fluids and Diet Begin with clear liquids, bouillon, dry toast, soda crackers.  If not nauseated, you may go to a regular diet when you desire.  Greasy and spicy foods are not advised.  Medications  If you have not had a bowel movement in 24 hours, take 2 tablespoons over the counter Milk of mag.             You May resume your blood thinners tomorrow (Aspirin, coumadin, or other).  You are being discharged with prescriptions for Opioid/Narcotic Medications: There are some specific considerations for these medications that you should know. Opioid Meds have risks & benefits. Addiction to these meds is always a concern with prolonged use Take medication only as directed Do not drive while taking narcotic pain medication Do not crush tablets or capsules Do not use a different container than medication was dispensed in Lock the container of medication in a cool, dry place out of reach of children and pets. Opioid medication can cause addiction Do not share with anyone else (this is a felony) Do not store medications for future use. Dispose of them properly.     Disposal:  Find a [place] household drug take back site near you.  If you can't get to a drug take back site, use the recipe below as a last resort to dispose of expired, unused or unwanted drugs. Disposal  (Do not dispose chemotherapy drugs this way, talk to your  prescribing doctor instead.) Step 1: Mix drugs (do not crush) with dirt, kitty litter, or used coffee grounds and add a small amount of water to dissolve any solid medications. Step 2: Seal drugs in plastic bag. Step 3: Place plastic bag in trash. Step 4: Take prescription container and scratch out personal information, then recycle or throw away.  Operative Site  You have a liquid bandage over your incisions, this will begin to flake off in about a week. Ok to shower tomorrow. Keep wound clean and dry. No baths or swimming. No lifting more than 10 pounds.  Contact Information: If you have questions or concerns, please call our office, [phone], Monday- Thursday 8AM-5PM and Friday 8AM-12Noon.  If it is after hours or on the weekend, please call [facility] Main Number, [phone], and ask to speak to the surgeon on call for Dr. Kolton Kienle at [place].   SPECIFIC COMPLICATIONS TO WATCH FOR: Inability to urinate Fever over 101? F by mouth Nausea and vomiting lasting longer than 24 hours. Pain not relieved by medication ordered Swelling around the operative site Increased redness, warmth, hardness, around operative area Numbness, tingling, or cold fingers or toes Blood -soaked dressing, (small amounts of oozing may be normal) Increasing and progressive drainage from surgical area or exam site  

## 2021-05-26 NOTE — Anesthesia Postprocedure Evaluation (Signed)
Anesthesia Post Note  Patient: Jeremy Rodriguez  Procedure(s) Performed: INSERTION PORT-A-CATH- IJ (Right: Chest)  Patient location during evaluation: Phase II Anesthesia Type: General Level of consciousness: awake and alert and oriented Pain management: pain level controlled Vital Signs Assessment: post-procedure vital signs reviewed and stable Respiratory status: spontaneous breathing, nonlabored ventilation and respiratory function stable Cardiovascular status: blood pressure returned to baseline and stable Postop Assessment: no apparent nausea or vomiting Anesthetic complications: no   No notable events documented.   Last Vitals:  Vitals:   05/26/21 0927 05/26/21 0929  BP:  (!) 98/54  Pulse: 63   Resp: 16   Temp:    SpO2: 96%     Last Pain:  Vitals:   05/26/21 0927  TempSrc:   PainSc: Asleep                 Jeremy Rodriguez

## 2021-05-26 NOTE — Op Note (Signed)
Operative Note 05/26/21   Preoperative Diagnosis: Metastatic Urothelial Carcinoma   Postoperative Diagnosis: Same   Procedure(s) Performed: Right IJ Port-A-Cath placement with ultrasound guidance   Surgeon: Graciella Freer, DO    Assistants: None   Anesthesia: Monitored anesthesia care   Anesthesiologist: Denese Killings, MD    Specimens: None   Estimated Blood Loss: Minimal   Fluoroscopy time: 31 seconds   Blood Replacement: None    Complications: None    Operative Findings: Right IJ Port-a-cath in place  Indications: Patient is a 71 year old male who is here for right internal jugular port-a-cath insertion.  He has been being treated for metastatic urothelial carcinoma, and he requires port placement at this time, as he is difficult to obtain IV access on for his infusions.  All risks, benefits, and alternatives to R IJ port-a-cath insertion were discussed with the patient, all of his questions were answered to his expressed satisfaction.  The patient expresses he wishes to proceed, and informed consent was obtained.  Procedure: The patient was brought into the operating room and anesthesia was induced.  One percent lidocaine was used for local anesthesia.   The right chest and neck was prepped and draped in the usual sterile fashion.  Preoperative antibiotics were given.  The access site on the neck was localized.  Using ultrasound guidance, the access needle was advanced into the right internal jugular vein using the Seldinger technique without difficulty. A guidewire was then advanced into the right atrium under fluoroscopic guidance.  Ectopia was not noted. The skin was knicked.  The pocket location and the tunnel tract were localized.  An incision was made below the right clavicle.  A subcutaneous pocket was formed. The catheter was then tunneled from the pocket to the access site in the neck.  An introducer and peel-away sheath were placed over the guidewire. The  catheter was then inserted through the peel-away sheath and the peel-away sheath was removed.  A spot film was performed to confirm the position. The catheter was then attached to the port and the port placed in subcutaneous pocket. Adequate positioning was confirmed by fluoroscopy. Hemostasis was confirmed.  Good backflow of blood was noted on aspiration of the port. The port was flushed with heparin flush. Subcutaneous layer was reapproximated using a 3-0 Vicryl interrupted suture. The skin was closed using a 4-0 Vicryl subcuticular suture. Dermabond was applied.    All tape and needle counts were correct at the end of the procedure. The patient was transferred to PACU in stable condition. A chest x-ray will be performed at that time.  Graciella Freer, DO  [facility] [address] [place], [place] [address] [phone] (office)

## 2021-05-26 NOTE — Anesthesia Procedure Notes (Signed)
Date/Time: 05/26/2021 8:32 AM Performed by: Karna Dupes, CRNA Pre-anesthesia Checklist: Patient identified, Emergency Drugs available, Suction available and Patient being monitored Patient Re-evaluated:Patient Re-evaluated prior to induction Oxygen Delivery Method: Nasal cannula

## 2021-05-31 ENCOUNTER — Encounter (HOSPITAL_COMMUNITY): Payer: Self-pay | Admitting: Surgery

## 2021-06-03 ENCOUNTER — Inpatient Hospital Stay (HOSPITAL_COMMUNITY): Payer: Medicare Other

## 2021-06-03 ENCOUNTER — Inpatient Hospital Stay (HOSPITAL_COMMUNITY): Payer: Medicare Other | Attending: Hematology

## 2021-06-03 ENCOUNTER — Inpatient Hospital Stay (HOSPITAL_BASED_OUTPATIENT_CLINIC_OR_DEPARTMENT_OTHER): Payer: Medicare Other | Admitting: Hematology

## 2021-06-03 ENCOUNTER — Other Ambulatory Visit: Payer: Self-pay

## 2021-06-03 VITALS — BP 157/87 | HR 86 | Temp 97.5°F | Resp 19 | Ht 66.0 in | Wt 153.2 lb

## 2021-06-03 DIAGNOSIS — R140 Abdominal distension (gaseous): Secondary | ICD-10-CM | POA: Diagnosis not present

## 2021-06-03 DIAGNOSIS — C787 Secondary malignant neoplasm of liver and intrahepatic bile duct: Secondary | ICD-10-CM | POA: Insufficient documentation

## 2021-06-03 DIAGNOSIS — K5900 Constipation, unspecified: Secondary | ICD-10-CM | POA: Insufficient documentation

## 2021-06-03 DIAGNOSIS — F419 Anxiety disorder, unspecified: Secondary | ICD-10-CM | POA: Diagnosis not present

## 2021-06-03 DIAGNOSIS — Z79899 Other long term (current) drug therapy: Secondary | ICD-10-CM | POA: Diagnosis not present

## 2021-06-03 DIAGNOSIS — C7910 Secondary malignant neoplasm of unspecified urinary organs: Secondary | ICD-10-CM

## 2021-06-03 DIAGNOSIS — R1012 Left upper quadrant pain: Secondary | ICD-10-CM | POA: Insufficient documentation

## 2021-06-03 DIAGNOSIS — F17210 Nicotine dependence, cigarettes, uncomplicated: Secondary | ICD-10-CM | POA: Insufficient documentation

## 2021-06-03 DIAGNOSIS — D509 Iron deficiency anemia, unspecified: Secondary | ICD-10-CM | POA: Diagnosis not present

## 2021-06-03 DIAGNOSIS — Z5111 Encounter for antineoplastic chemotherapy: Secondary | ICD-10-CM | POA: Diagnosis not present

## 2021-06-03 DIAGNOSIS — G629 Polyneuropathy, unspecified: Secondary | ICD-10-CM | POA: Insufficient documentation

## 2021-06-03 DIAGNOSIS — C652 Malignant neoplasm of left renal pelvis: Secondary | ICD-10-CM | POA: Diagnosis not present

## 2021-06-03 DIAGNOSIS — R109 Unspecified abdominal pain: Secondary | ICD-10-CM

## 2021-06-03 LAB — CBC WITH DIFFERENTIAL/PLATELET
Abs Immature Granulocytes: 0.02 10*3/uL (ref 0.00–0.07)
Basophils Absolute: 0 10*3/uL (ref 0.0–0.1)
Basophils Relative: 1 %
Eosinophils Absolute: 0.2 10*3/uL (ref 0.0–0.5)
Eosinophils Relative: 3 %
HCT: 36.8 % — ABNORMAL LOW (ref 39.0–52.0)
Hemoglobin: 12.5 g/dL — ABNORMAL LOW (ref 13.0–17.0)
Immature Granulocytes: 0 %
Lymphocytes Relative: 22 %
Lymphs Abs: 1.2 10*3/uL (ref 0.7–4.0)
MCH: 30.6 pg (ref 26.0–34.0)
MCHC: 34 g/dL (ref 30.0–36.0)
MCV: 90 fL (ref 80.0–100.0)
Monocytes Absolute: 0.6 10*3/uL (ref 0.1–1.0)
Monocytes Relative: 11 %
Neutro Abs: 3.5 10*3/uL (ref 1.7–7.7)
Neutrophils Relative %: 63 %
Platelets: 258 10*3/uL (ref 150–400)
RBC: 4.09 MIL/uL — ABNORMAL LOW (ref 4.22–5.81)
RDW: 16.1 % — ABNORMAL HIGH (ref 11.5–15.5)
WBC: 5.5 10*3/uL (ref 4.0–10.5)
nRBC: 0 % (ref 0.0–0.2)

## 2021-06-03 LAB — COMPREHENSIVE METABOLIC PANEL
ALT: 24 U/L (ref 0–44)
AST: 25 U/L (ref 15–41)
Albumin: 3.6 g/dL (ref 3.5–5.0)
Alkaline Phosphatase: 102 U/L (ref 38–126)
Anion gap: 8 (ref 5–15)
BUN: 15 mg/dL (ref 8–23)
CO2: 21 mmol/L — ABNORMAL LOW (ref 22–32)
Calcium: 8.9 mg/dL (ref 8.9–10.3)
Chloride: 107 mmol/L (ref 98–111)
Creatinine, Ser: 1.01 mg/dL (ref 0.61–1.24)
GFR, Estimated: >60 mL/min (ref >60)
Glucose, Bld: 166 mg/dL — ABNORMAL HIGH (ref 70–99)
Potassium: 4 mmol/L (ref 3.5–5.1)
Sodium: 136 mmol/L (ref 135–145)
Total Bilirubin: 0.2 mg/dL — ABNORMAL LOW (ref 0.3–1.2)
Total Protein: 7 g/dL (ref 6.5–8.1)

## 2021-06-03 LAB — TSH: TSH: 1.628 u[IU]/mL (ref 0.350–4.500)

## 2021-06-03 LAB — MAGNESIUM: Magnesium: 2 mg/dL (ref 1.7–2.4)

## 2021-06-03 LAB — PHOSPHORUS: Phosphorus: 3.1 mg/dL (ref 2.5–4.6)

## 2021-06-03 MED ORDER — FAMOTIDINE IN NACL 20-0.9 MG/50ML-% IV SOLN
20.0000 mg | Freq: Once | INTRAVENOUS | Status: AC
  Administered 2021-06-03: 20 mg via INTRAVENOUS
  Filled 2021-06-03: qty 50

## 2021-06-03 MED ORDER — PALONOSETRON HCL INJECTION 0.25 MG/5ML
0.2500 mg | Freq: Once | INTRAVENOUS | Status: AC
  Administered 2021-06-03: 0.25 mg via INTRAVENOUS
  Filled 2021-06-03: qty 5

## 2021-06-03 MED ORDER — SODIUM CHLORIDE 0.9 % IV SOLN
10.0000 mg | Freq: Once | INTRAVENOUS | Status: AC
  Administered 2021-06-03: 10 mg via INTRAVENOUS
  Filled 2021-06-03: qty 10

## 2021-06-03 MED ORDER — SODIUM CHLORIDE 0.9% FLUSH
10.0000 mL | INTRAVENOUS | Status: DC | PRN
  Administered 2021-06-03: 10 mL

## 2021-06-03 MED ORDER — SODIUM CHLORIDE 0.9 % IV SOLN: Freq: Once | INTRAVENOUS | Status: AC

## 2021-06-03 MED ORDER — SODIUM CHLORIDE 0.9 % IV SOLN
10.0000 mg/kg | Freq: Once | INTRAVENOUS | Status: AC
  Administered 2021-06-03: 670 mg via INTRAVENOUS
  Filled 2021-06-03: qty 67

## 2021-06-03 MED ORDER — ACETAMINOPHEN 325 MG PO TABS
650.0000 mg | ORAL_TABLET | Freq: Once | ORAL | Status: AC
  Administered 2021-06-03: 650 mg via ORAL
  Filled 2021-06-03: qty 2

## 2021-06-03 MED ORDER — SODIUM CHLORIDE 0.9 % IV SOLN
150.0000 mg | Freq: Once | INTRAVENOUS | Status: AC
  Administered 2021-06-03: 150 mg via INTRAVENOUS
  Filled 2021-06-03: qty 150

## 2021-06-03 MED ORDER — HEPARIN SOD (PORK) LOCK FLUSH 100 UNIT/ML IV SOLN
500.0000 [IU] | Freq: Once | INTRAVENOUS | Status: AC | PRN
  Administered 2021-06-03: 500 [IU]

## 2021-06-03 NOTE — Progress Notes (Signed)
Patient presents today for Trodelvy infusion per providers order.  Vital signs within parameters for treatment.  Labs pending.  No new complaints at this time. ? ?Labs within parameters for treatment. ? ?Message received from Anastasio Champion RN/Dr. Delton Coombes patient okay for treatment. ? ?Ivette Loyal given today per MD orders. Stable during infusion without adverse affects.  Vital signs stable.  No complaints at this time.  Discharge from clinic ambulatory in stable condition.  Alert and oriented X 3.  Follow up with [facility] as scheduled.  ?

## 2021-06-03 NOTE — Patient Instructions (Signed)
[facility]  Discharge Instructions: ?Thank you for choosing [facility] to provide your oncology and hematology care.  ?If you have a lab appointment with the [facility], please come in thru the Main Entrance and check in at the main information desk. ? ?Wear comfortable clothing and clothing appropriate for easy access to any Portacath or PICC line.  ? ?We strive to give you quality time with your provider. You may need to reschedule your appointment if you arrive late (15 or more minutes).  Arriving late affects you and other patients whose appointments are after yours.  Also, if you miss three or more appointments without notifying the office, you may be dismissed from the clinic at the provider?s discretion.    ?  ?For prescription refill requests, have your pharmacy contact our office and allow 72 hours for refills to be completed.   ? ?Today you received the following chemotherapy and/or immunotherapy agents Ivette Loyal    ?  ?To help prevent nausea and vomiting after your treatment, we encourage you to take your nausea medication as directed. ? ?BELOW ARE SYMPTOMS THAT SHOULD BE REPORTED IMMEDIATELY: ?*FEVER GREATER THAN 100.4 F (38 ?C) OR HIGHER ?*CHILLS OR SWEATING ?*NAUSEA AND VOMITING THAT IS NOT CONTROLLED WITH YOUR NAUSEA MEDICATION ?*UNUSUAL SHORTNESS OF BREATH ?*UNUSUAL BRUISING OR BLEEDING ?*URINARY PROBLEMS (pain or burning when urinating, or frequent urination) ?*BOWEL PROBLEMS (unusual diarrhea, constipation, pain near the anus) ?TENDERNESS IN MOUTH AND THROAT WITH OR WITHOUT PRESENCE OF ULCERS (sore throat, sores in mouth, or a toothache) ?UNUSUAL RASH, SWELLING OR PAIN  ?UNUSUAL VAGINAL DISCHARGE OR ITCHING  ? ?Items with * indicate a potential emergency and should be followed up as soon as possible or go to the Emergency Department if any problems should occur. ? ?Please show the CHEMOTHERAPY ALERT CARD or IMMUNOTHERAPY ALERT CARD at check-in to the Emergency  Department and triage nurse. ? ?Should you have questions after your visit or need to cancel or reschedule your appointment, please contact [facility] [phone]  and follow the prompts.  Office hours are 8:00 a.m. to 4:30 p.m. Monday - Friday. Please note that voicemails left after 4:00 p.m. may not be returned until the following business day.  We are closed weekends and major holidays. You have access to a nurse at all times for urgent questions. Please call the main number to the clinic [phone] and follow the prompts. ? ?For any non-urgent questions, you may also contact your provider using MyChart. We now offer e-Visits for anyone 16 and older to request care online for non-urgent symptoms. For details visit [email or web address]. ?  ?Also download the MyChart app! Go to the app store, search "MyChart", open the app, select [facility], and log in with your MyChart username and password. ? ?Due to Covid, a mask is required upon entering the hospital/clinic. If you do not have a mask, one will be given to you upon arrival. For doctor visits, patients may have 1 support person aged 62 or older with them. For treatment visits, patients cannot have anyone with them due to current Covid guidelines and our immunocompromised population.  ?

## 2021-06-03 NOTE — Patient Instructions (Signed)
[place] at [facility] ?Discharge Instructions ? ? ?You were seen and examined today by Dr. Delton Coombes. ? ?He reviewed your lab work which is normal/stable. ? ?We will proceed with your treatment today. ? ?Return as scheduled for lab work and treatment.  ? ? ?Thank you for choosing [facility] at [facility] to provide your oncology and hematology care.  To afford each patient quality time with our provider, please arrive at least 15 minutes before your scheduled appointment time.  ? ?If you have a lab appointment with the [facility] please come in thru the Main Entrance and check in at the main information desk. ? ?You need to re-schedule your appointment should you arrive 10 or more minutes late.  We strive to give you quality time with our providers, and arriving late affects you and other patients whose appointments are after yours.  Also, if you no show three or more times for appointments you may be dismissed from the clinic at the providers discretion.     ?Again, thank you for choosing [facility].  Our hope is that these requests will decrease the amount of time that you wait before being seen by our physicians.       ?_____________________________________________________________ ? ?Should you have questions after your visit to [facility], please contact our office at [phone] and follow the prompts.  Our office hours are 8:00 a.m. and 4:30 p.m. Monday - Friday.  Please note that voicemails left after 4:00 p.m. may not be returned until the following business day.  We are closed weekends and major holidays.  You do have access to a nurse 24-7, just call the main number to the clinic [phone] and do not press any options, hold on the line and a nurse will answer the phone.   ? ?For prescription refill requests, have your pharmacy contact our office and allow 72 hours.   ? ?Due to Covid, you will need to wear a mask upon  entering the hospital. If you do not have a mask, a mask will be given to you at the Main Entrance upon arrival. For doctor visits, patients may have 1 support person age 6 or older with them. For treatment visits, patients can not have anyone with them due to social distancing guidelines and our immunocompromised population.  ? ?   ?

## 2021-06-03 NOTE — Progress Notes (Signed)
Patient has been examined by Dr. Katragadda, and vital signs and labs have been reviewed. ANC, Creatinine, LFTs, hemoglobin, and platelets are within treatment parameters per M.D. - pt may proceed with treatment.    °

## 2021-06-03 NOTE — Progress Notes (Signed)
[facility] [address], [place] [address]   CLINIC:  Medical Oncology/Hematology  PCP:  Lindell Spar, MD [address] / [place] [address] [phone]   REASON FOR VISIT:  Follow-up for metastatic urothelial carcinoma  PRIOR THERAPY:  Keytruda every 3 weeks Enfortumab every 4 weeks  NGS Results: not done  CURRENT THERAPY: Sacituzumab govitecan-hziy [name]  BRIEF ONCOLOGIC HISTORY:  Oncology History  Metastatic urothelial carcinoma ([facility])  08/13/2020 Initial Diagnosis   Metastatic urothelial carcinoma ([facility])   08/13/2020 Cancer Staging   Staging form: Urinary Bladder, AJCC 8th Edition - Clinical stage from 08/13/2020: Stage IVB (cTX, cN0, pM1b) - Signed by Derek Jack, MD on 08/13/2020 Stage prefix: Initial diagnosis WHO/ISUP grade (low/high): High Grade Histologic grading system: 2 grade system    08/20/2020 - 10/22/2020 Chemotherapy          11/19/2020 - 01/28/2021 Chemotherapy   Patient is on Treatment Plan : UROTHELIAL LOCALLY ADVANCED / METASTATIC Enfortumab q28d     02/15/2021 -  Chemotherapy   Patient is on Treatment Plan : BLADDER Sacituzumab govitecan-hziy Ivette Loyal) q21d       CANCER STAGING:  Cancer Staging  Metastatic urothelial carcinoma ([facility]) Staging form: Urinary Bladder, AJCC 8th Edition - Clinical stage from 08/13/2020: Stage IVB (cTX, cN0, pM1b) - Signed by Derek Jack, MD on 08/13/2020   INTERVAL HISTORY:  Jeremy Rodriguez, a 71 y.o. male, returns for routine follow-up and consideration for next cycle of chemotherapy. Jeremy Rodriguez was last seen on 05/13/2021.  Due for cycle #6 of Sacituzumab today.   Overall, he tells me he has been feeling pretty well. His appetite is good. His left sided abdominal pain is stable and he is taking ibuprofen and oxycodone prn; he is taking ibuprofen twice daily. He denies blurry vision following his last treatment. He reports occasional mild  tingling/numbness in his toes and soles, and he denies associated pain.   Overall, he feels ready for next cycle of chemo today.   REVIEW OF SYSTEMS:  Review of Systems  Constitutional:  Negative for appetite change and fatigue.  Eyes:  Negative for eye problems.  Gastrointestinal:  Positive for abdominal pain (stable).  Neurological:  Positive for numbness (toes and soles).  All other systems reviewed and are negative.  PAST MEDICAL/SURGICAL HISTORY:  Past Medical History:  Diagnosis Date   Chronic kidney disease    GERD (gastroesophageal reflux disease)    Hypertension    Medical history non-contributory    Past Surgical History:  Procedure Laterality Date   appendectomy     PORTACATH PLACEMENT Right 05/26/2021   Procedure: INSERTION PORT-A-CATH- IJ;  Surgeon: Rusty Aus, DO;  Location: [facility];  Service: General;  Laterality: Right;  Internal Jugular     SOCIAL HISTORY:  Social History   Socioeconomic History   Marital status: Single    Spouse name: Not on file   Number of children: Not on file   Years of education: Not on file   Highest education level: Not on file  Occupational History   Not on file  Tobacco Use   Smoking status: Some Days   Smokeless tobacco: Never   Tobacco comments:    a few cigarettes a day  Substance and Sexual Activity   Alcohol use: Yes    Comment: occ   Drug use: Never   Sexual activity: Not on file  Other Topics Concern   Not on file  Social History Narrative  Not on file   Social Determinants of Health   Financial Resource Strain: Low Risk    Difficulty of Paying Living Expenses: Not hard at all  Food Insecurity: No Food Insecurity   Worried About Charity fundraiser in the Last Year: Never true   [place] in the Last Year: Never true  Transportation Needs: No Transportation Needs   Lack of Transportation (Medical): No   Lack of Transportation (Non-Medical): No  Physical Activity: Inactive   Days of  Exercise per Week: 5 days   Minutes of Exercise per Session: 0 min  Stress: No Stress Concern Present   Feeling of Stress : Not at all  Social Connections: Moderately Isolated   Frequency of Communication with Friends and Family: More than three times a week   Frequency of Social Gatherings with Friends and Family: More than three times a week   Attends Religious Services: More than 4 times per year   Active Member of [employer] or Organizations: No   Attends Music therapist: Never   Marital Status: Divorced  Human resources officer Violence: Not At Risk   Fear of Current or Ex-Partner: No   Emotionally Abused: No   Physically Abused: No   Sexually Abused: No    FAMILY HISTORY:  Family History  Problem Relation Age of Onset   Colon cancer Neg Hx    Colon polyps Neg Hx    Liver cancer Neg Hx    Pancreatic cancer Neg Hx     CURRENT MEDICATIONS:  Current Outpatient Medications  Medication Sig Dispense Refill   ALPRAZolam (XANAX) 1 MG tablet Take 1 tablet (1 mg total) by mouth 2 (two) times daily. 60 tablet 3   dicyclomine (BENTYL) 10 MG capsule Take 1 capsule (10 mg total) by mouth 3 (three) times daily as needed for spasms. (Patient taking differently: Take 10 mg by mouth 3 (three) times daily before meals.) 90 capsule 6   docusate sodium (COLACE) 100 MG capsule Take 1 capsule (100 mg total) by mouth daily as needed for mild constipation. 30 capsule 0   ibuprofen (ADVIL) 800 MG tablet Take 1 tablet (800 mg total) by mouth 2 (two) times daily as needed. (Patient taking differently: Take 800 mg by mouth daily.) 60 tablet 0   Lactulose 20 GM/30ML SOLN Take 30 mLs (20 g total) by mouth daily. (Patient taking differently: Take 30 mLs by mouth daily as needed (Constipation).) 473 mL 6   linaclotide (LINZESS) 145 MCG CAPS capsule Take 1 capsule (145 mcg total) by mouth daily before breakfast. 30 capsule 2   megestrol (MEGACE) 400 MG/10ML suspension Take 10 mLs (400 mg total) by mouth  daily. 480 mL 3   Oxycodone HCl 20 MG TABS Take 0.75 tablets (15 mg total) by mouth every 8 (eight) hours as needed. (Patient taking differently: Take 20 mg by mouth every 8 (eight) hours as needed.) 84 tablet 0   pantoprazole (PROTONIX) 40 MG tablet Take 1 tablet (40 mg total) by mouth daily. 30 tablet 6   sennosides-docusate sodium (SENOKOT-S) 8.6-50 MG tablet Take 1 tablet by mouth daily.     tadalafil (CIALIS) 10 MG tablet Take 2 tablets (20 mg total) by mouth daily as needed for erectile dysfunction. (Patient taking differently: Take 10 mg by mouth daily.) 30 tablet 3   zolpidem (AMBIEN) 10 MG tablet Take 1 tablet (10 mg total) by mouth at bedtime as needed for sleep. 30 tablet 2   prochlorperazine (COMPAZINE) 10  MG tablet Take 1 tablet (10 mg total) by mouth every 6 (six) hours as needed for nausea or vomiting. (Patient not taking: Reported on 06/03/2021) 30 tablet 3   No current facility-administered medications for this visit.    ALLERGIES:  No Known Allergies  PHYSICAL EXAM:  Performance status (ECOG): 1 - Symptomatic but completely ambulatory  Vitals:   06/03/21 0959  BP: (!) 157/87  Pulse: 86  Resp: 19  Temp: (!) 97.5 F (36.4 C)  SpO2: 99%   Wt Readings from Last 3 Encounters:  06/03/21 153 lb 3.2 oz (69.5 kg)  05/26/21 154 lb (69.9 kg)  05/21/21 152 lb 12.8 oz (69.3 kg)   Physical Exam Vitals reviewed.  Constitutional:      Appearance: Normal appearance.  Cardiovascular:     Rate and Rhythm: Normal rate and regular rhythm.     Pulses: Normal pulses.     Heart sounds: Normal heart sounds.  Pulmonary:     Effort: Pulmonary effort is normal.     Breath sounds: Normal breath sounds.  Abdominal:     Palpations: Abdomen is soft. There is no hepatomegaly, splenomegaly or mass.     Tenderness: There is abdominal tenderness in the left upper quadrant.  Neurological:     General: No focal deficit present.     Mental Status: He is alert and oriented to person, place,  and time.  Psychiatric:        Mood and Affect: Mood normal.        Behavior: Behavior normal.    LABORATORY DATA:  I have reviewed the labs as listed.  CBC Latest Ref Rng & Units 05/20/2021 05/13/2021 04/29/2021  WBC 4.0 - 10.5 K/uL 3.5(L) 4.2 4.4  Hemoglobin 13.0 - 17.0 g/dL 11.9(L) 11.9(L) 12.3(L)  Hematocrit 39.0 - 52.0 % 35.4(L) 35.8(L) 35.6(L)  Platelets 150 - 400 K/uL 327 250 334   CMP Latest Ref Rng & Units 05/20/2021 05/13/2021 04/29/2021  Glucose 70 - 99 mg/dL 152(H) 167(H) 123(H)  BUN 8 - 23 mg/dL _0 Creatinine 0.61 - 1.24 mg/dL 1.08 0.96 1.01  Sodium 135 - 145 mmol/L 138 135 135  Potassium 3.5 - 5.1 mmol/L 4.0 3.9 3.8  Chloride 98 - 111 mmol/L 106 108 104  CO2 22 - 32 mmol/L 20(L) 19(L) 23  Calcium 8.9 - 10.3 mg/dL 8.9 8.7(L) 8.8(L)  Total Protein 6.5 - 8.1 g/dL 6.7 6.3(L) 6.8  Total Bilirubin 0.3 - 1.2 mg/dL 0.4 0.8 0.3  Alkaline Phos 38 - 126 U/L 93 82 83  AST 15 - 41 U/L _1 ALT 0 - 44 U/L _2 DIAGNOSTIC IMAGING:  I have independently reviewed the scans and discussed with the patient. DG Chest 1 View  Result Date: 05/26/2021 CLINICAL DATA:  Right IJ port placement. EXAM: CHEST  1 VIEW COMPARISON:  CT chest, abdomen, and pelvis 05/06/2021 FINDINGS: A right jugular Port-A-Cath has been placed and terminates over the upper SVC. The cardiomediastinal silhouette is within normal limits. No airspace consolidation, edema, sizable pleural effusion, or pneumothorax is identified. Asymmetric right acromioclavicular degenerative changes and old left rib fractures are noted. IMPRESSION: Interval Port-A-Cath placement as above. Electronically Signed   By: Logan Bores M.D.   On: 05/26/2021 09:46   CT CHEST ABDOMEN PELVIS W CONTRAST  Result Date: 05/08/2021 CLINICAL DATA:  71 year old male with history of metastatic urothelial carcinoma undergoing ongoing chemotherapy. Follow-up study. EXAM: CT CHEST, ABDOMEN, AND PELVIS WITH CONTRAST TECHNIQUE:  Multidetector CT  imaging of the chest, abdomen and pelvis was performed following the standard protocol during bolus administration of intravenous contrast. RADIATION DOSE REDUCTION: This exam was performed according to the departmental dose-optimization program which includes automated exposure control, adjustment of the mA and/or kV according to patient size and/or use of iterative reconstruction technique. CONTRAST:  164m OMNIPAQUE IOHEXOL 300 MG/ML  SOLN COMPARISON:  CT the chest, abdomen and pelvis 02/05/2021. FINDINGS: CT CHEST FINDINGS Cardiovascular: Heart size is normal. There is no significant pericardial fluid, thickening or pericardial calcification. Atherosclerotic calcifications in the thoracic aorta. No definite coronary artery calcifications. Mediastinum/Nodes: No pathologically enlarged mediastinal or hilar lymph nodes. Esophagus is unremarkable in appearance. No axillary lymphadenopathy. Lungs/Pleura: No suspicious appearing pulmonary nodules or masses are noted. No acute consolidative airspace disease. No pleural effusions. Mild scarring in the inferior segment of the lingula. Musculoskeletal: There are no aggressive appearing lytic or blastic lesions noted in the visualized portions of the skeleton. CT ABDOMEN PELVIS FINDINGS Hepatobiliary: Numerous hypovascular hepatic lesions appear generally similar in number and size compared to the prior examination, although these have changed qualitatively in appearance, with the vast majority of these lesions demonstrating far more hypovascular and centrally low-attenuation than previously noted. The largest lesion on today's study is an infiltrative lesion in the central aspect of the liver which appears to partially invade the intrahepatic portion of the inferior vena cava (coronal image 74 of series 3 and axial image 53 of series 2) measuring 4.1 x 3.0 x 3.3 cm. Another prominent lesion in segment 8 of the liver (axial image 53 of series 2) currently measures 3.4 x  3.3 cm (previously 3.9 x 3.7 cm) and is far more centrally low-attenuation than the prior study. Another lesion in segment 4B of the liver (axial image 61 of series 2) has also slightly decreased in size compared to the prior examination, currently measuring 2.9 x 2.1 cm (previously 2.9 x 2.7 cm), and is far more centrally low-attenuation. No definite new hepatic lesions. There is a small amount of intrahepatic biliary ductal dilatation which appears isolated predominantly in segment 4A of the liver, likely related to some central obstruction from the lesion in segment 4A described above. No extrahepatic biliary ductal dilatation. Gallbladder is normal in appearance. Pancreas: No pancreatic mass. No pancreatic ductal dilatation. No pancreatic or peripancreatic fluid collections or inflammatory changes. Spleen: Unremarkable. Adrenals/Urinary Tract: Right kidney and right adrenal gland are normal in appearance. Infiltrative heterogeneously enhancing lesion in the left kidney which extends into the adjacent portions of the left retroperitoneum and invades the crus of the left hemidiaphragm, poorly defined and therefore difficult to accurately measure, but estimated to measure approximately 7.8 x 3.1 x 6.1 cm (axial image 58 of series 2 and coronal image 102 of series 3). This lesion again appears to extend into the left renal vein, and is diffusely infiltrative throughout the interpolar and upper pole region of the left kidney. 3 mm nonobstructive calculus in the lower pole collecting system of left kidney. No hydroureteronephrosis. Urinary bladder is normal in appearance. Left adrenal gland is not confidently identified and is likely encapsulated by the malignant left renal neoplasm. Stomach/Bowel: The appearance of the stomach is normal. There is no pathologic dilatation of small bowel or colon. The appendix is not confidently identified and may be surgically absent. Regardless, there are no inflammatory changes  noted adjacent to the cecum to suggest the presence of an acute appendicitis at this time. Vascular/Lymphatic: Aortic atherosclerosis, without evidence of  aneurysm or dissection in the abdominal or pelvic vasculature. There are multiple prominent borderline enlarged retroperitoneal lymph nodes, but no definite pathologically enlarged lymph nodes confidently identified in the abdomen or pelvis. Reproductive: Prostate gland and seminal vesicles are unremarkable in appearance. Other: No significant volume of ascites.  No pneumoperitoneum. Musculoskeletal: There are no aggressive appearing lytic or blastic lesions noted in the visualized portions of the skeleton. IMPRESSION: 1. There are qualitative changes in numerous intrahepatic metastases, favored to reflect a positive response to therapy with developing areas of internal necrosis within the metastatic lesions. No new intrahepatic metastases are noted. 2. Infiltrative mass in the left kidney similar to the prior examination. This again encases the left adrenal gland and directly invades the crus of the left hemidiaphragm. 3. Previously noted left hilar lymphadenopathy has resolved. 4. Aortic atherosclerosis. 5. 3 mm nonobstructive calculus in the lower pole collecting system of the left kidney. 6. Additional incidental findings, as above. Electronically Signed   By: Vinnie Langton M.D.   On: 05/08/2021 16:05   DG C-Arm 1-60 Min-No Report  Result Date: 05/26/2021 Fluoroscopy was utilized by the requesting physician.  No radiographic interpretation.     ASSESSMENT:  1. 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 - 20 pound weight loss in the last 4 months due to decreased appetite.  Left upper quadrant pain for the past 2 to 3 months. - CTAP with contrast on 07/10/2020 showed ill-defined low-density mass in the right hepatic lobe measuring 2 x 2  cm.  Left upper pole kidney mass measuring 7.6 x 5.9 cm with encasement and narrowing of the left renal artery and vein.  No intravascular thrombus noted.  Mass encases the left adrenal gland.  Small right adrenal nodule measuring 11 mm, likely benign.  No other evidence of metastatic disease. - MRI of the liver with and without contrast on 07/20/2020 shows 2.2 x 1.7 cm enhancing lesion in the medial aspect of the segment 7, suspicious for metastasis.  Left renal mass, 6 x 7.6 x 6.9 cm infiltrating enhancing left upper pole renal mass, extending into the left renal sinus.  Possible TCC or lymphoma rather than renal cell carcinoma. - CT chest on 07/20/2020 with no evidence of metastatic disease. - PET scan on 08/04/2020 showed large hypermetabolic left upper pole mass and 2 hypermetabolic lesions in the right hepatic lobe favoring metastatic disease.  No hypermetabolic adenopathy.  - Liver biopsy showed poorly differentiated metastatic urothelial carcinoma.  IHC positive for CK5/6, CK7, GATA3, p40 and patchy positivity with p63 and PAX8. - He refused any chemotherapy. - 4 cycles of pembrolizumab from 08/20/2020 through 10/22/2020 with progression. - NGS testing showed benefit from pembrolizumab.  No mutations involving FGFR, NTRK was found.  Test was limited due to small sample. - CT CAP on 11/11/2020 showed interval progression of liver metastasis.  No significant change infiltrative mass arising in the upper pole of the left kidney invading the medial aspect of the upper spleen, left renal vein and left adrenal gland with soft tissue infiltration to the left retroperitoneal fat.  No evidence of metastatic disease to the chest. - Progression on 3 cycles of enfortumab vedotin. - CT CAP on 02/15/2021 showed decrease in size of the left renal mass.  Interval progression of numerous liver metastasis. - Guardant 360 results-KRAS G12D, PDGFRA T230T, DDR2 [id number], MSI high not detected.   2.  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 - Reports exposure to carbon black dust.  Smoked half pack per day for 30 years, currently smoking 2 to 3 cigarettes/day. - No family history of malignancy.   PLAN:  1. Metastatic poorly differentiated urothelial carcinoma arising in the renal pelvis: - CT CAP on 05/06/2021 showed improvement in liver metastatic disease.  Infiltrative mass in the left kidney similar to slightly improved from prior exam.  Previously noted left hilar lymphadenopathy has resolved. - He had port placed. - Blurring after each treatment is gone after we discontinued dexamethasone.  He tolerated without dexamethasone very well. - Reviewed labs today which showed normal LFTs and CBC.  TSH was 1.68. - He will proceed with cycle 6-day 1 treatment of sacituzumab govitecan today. - RTC 3 weeks for follow-up with labs and treatment.    2.  Left upper quadrant pain: - He has left upper quadrant pain on and off. - Continue ibuprofen 800 mg twice daily. - Continue oxycodone as needed.  3.  Constipation: - Continue Senokot 2 tablets at bedtime.  4.  Normocytic to microcytic anemia: - Hemoglobin is 12.5.  No need for parenteral iron therapy.  5.  Neuropathy: - He reports numbness in the bottom of the toes most of the time.  He reports it feels like walking on clouds. - Most likely resulted from enfortumab vedotin.  Sacituzumab govitecan has slight incidence of neuropathy about 10%. - No indication for pharmacological intervention at this time.  6.  Anxiety: - Continue Xanax at bedtime as needed.   Orders placed this encounter:  No orders of the defined types were placed in this encounter.    Derek Jack, MD [facility] [phone]   I, Thana Ates, am acting as a scribe for Dr. Derek Jack.  I, Derek Jack MD, have reviewed the above documentation for accuracy and completeness, and I agree with the  above.

## 2021-06-08 ENCOUNTER — Ambulatory Visit (INDEPENDENT_AMBULATORY_CARE_PROVIDER_SITE_OTHER): Payer: Medicare Other | Admitting: Surgery

## 2021-06-08 DIAGNOSIS — Z09 Encounter for follow-up examination after completed treatment for conditions other than malignant neoplasm: Secondary | ICD-10-CM

## 2021-06-08 NOTE — Progress Notes (Signed)
[facility] Note  ? ?HPI:  ?71 y.o. Male presents to clinic for post-op follow-up evaluation right IJ Port-A-Cath placement.  He has been doing well since the surgery, denies significant pain associated with the port.  The only time he experiences pain, is when they attempt to access the port.  They have no difficulty using the port.  He has no other complaints at this time. ? ?Review of Systems:  ?All other review of systems: otherwise negative  ? ?Vital Signs:  ?There were no vitals taken for this visit.  ? ?Physical Exam:  ?Physical Exam ?Vitals reviewed.  ?Constitutional:   ?   Appearance: Normal appearance.  ?Neck:  ?   Comments: Right neck access site, and right chest site healing well without erythema, drainage, or signs of infection ?Musculoskeletal:  ?   Cervical back: Normal range of motion.  ?Neurological:  ?   Mental Status: He is alert.  ? ? ?Laboratory studies: None ? ?Imaging:  ?None ? ?Assessment:  ?71 y.o. yo Male who presents status post right IJ Port-A-Cath placement placement ? ?Plan:  ?-Patient doing well since the surgery.  Port-A-Cath functioning appropriately ?-Advised patient that when oncology states that he no longer requires the port, would be able to remove the port in the hospital in the minor procedure room ?-Follow up with me as needed ? ?All of the above recommendations were discussed with the patient, and all of patient's questions were answered to his expressed satisfaction. ? ?Graciella Freer, DO ?[facility] ?[address], [place] [id number] ?[phone] (office) ? ? ? ?

## 2021-06-10 ENCOUNTER — Inpatient Hospital Stay (HOSPITAL_COMMUNITY): Payer: Medicare Other

## 2021-06-10 ENCOUNTER — Other Ambulatory Visit: Payer: Self-pay

## 2021-06-10 VITALS — BP 143/71 | HR 74 | Temp 98.1°F | Resp 16

## 2021-06-10 DIAGNOSIS — C652 Malignant neoplasm of left renal pelvis: Secondary | ICD-10-CM

## 2021-06-10 DIAGNOSIS — F17210 Nicotine dependence, cigarettes, uncomplicated: Secondary | ICD-10-CM | POA: Diagnosis not present

## 2021-06-10 DIAGNOSIS — D509 Iron deficiency anemia, unspecified: Secondary | ICD-10-CM | POA: Diagnosis not present

## 2021-06-10 DIAGNOSIS — C7910 Secondary malignant neoplasm of unspecified urinary organs: Secondary | ICD-10-CM

## 2021-06-10 DIAGNOSIS — Z5111 Encounter for antineoplastic chemotherapy: Secondary | ICD-10-CM | POA: Diagnosis not present

## 2021-06-10 DIAGNOSIS — G629 Polyneuropathy, unspecified: Secondary | ICD-10-CM | POA: Diagnosis not present

## 2021-06-10 DIAGNOSIS — C787 Secondary malignant neoplasm of liver and intrahepatic bile duct: Secondary | ICD-10-CM | POA: Diagnosis not present

## 2021-06-10 DIAGNOSIS — Z79899 Other long term (current) drug therapy: Secondary | ICD-10-CM | POA: Diagnosis not present

## 2021-06-10 DIAGNOSIS — R1012 Left upper quadrant pain: Secondary | ICD-10-CM | POA: Diagnosis not present

## 2021-06-10 DIAGNOSIS — K5900 Constipation, unspecified: Secondary | ICD-10-CM | POA: Diagnosis not present

## 2021-06-10 LAB — COMPREHENSIVE METABOLIC PANEL
ALT: 28 U/L (ref 0–44)
AST: 20 U/L (ref 15–41)
Albumin: 3.6 g/dL (ref 3.5–5.0)
Alkaline Phosphatase: 92 U/L (ref 38–126)
Anion gap: 8 (ref 5–15)
BUN: 16 mg/dL (ref 8–23)
CO2: 21 mmol/L — ABNORMAL LOW (ref 22–32)
Calcium: 8.8 mg/dL — ABNORMAL LOW (ref 8.9–10.3)
Chloride: 106 mmol/L (ref 98–111)
Creatinine, Ser: 0.88 mg/dL (ref 0.61–1.24)
GFR, Estimated: >60 mL/min (ref >60)
Glucose, Bld: 121 mg/dL — ABNORMAL HIGH (ref 70–99)
Potassium: 3.7 mmol/L (ref 3.5–5.1)
Sodium: 135 mmol/L (ref 135–145)
Total Bilirubin: 0.4 mg/dL (ref 0.3–1.2)
Total Protein: 6.8 g/dL (ref 6.5–8.1)

## 2021-06-10 LAB — CBC WITH DIFFERENTIAL/PLATELET
Abs Immature Granulocytes: 0.05 10*3/uL (ref 0.00–0.07)
Basophils Absolute: 0 10*3/uL (ref 0.0–0.1)
Basophils Relative: 1 %
Eosinophils Absolute: 0.1 10*3/uL (ref 0.0–0.5)
Eosinophils Relative: 2 %
HCT: 34.8 % — ABNORMAL LOW (ref 39.0–52.0)
Hemoglobin: 11.8 g/dL — ABNORMAL LOW (ref 13.0–17.0)
Immature Granulocytes: 1 %
Lymphocytes Relative: 24 %
Lymphs Abs: 1.2 10*3/uL (ref 0.7–4.0)
MCH: 30.6 pg (ref 26.0–34.0)
MCHC: 33.9 g/dL (ref 30.0–36.0)
MCV: 90.4 fL (ref 80.0–100.0)
Monocytes Absolute: 0.5 10*3/uL (ref 0.1–1.0)
Monocytes Relative: 11 %
Neutro Abs: 3 10*3/uL (ref 1.7–7.7)
Neutrophils Relative %: 61 %
Platelets: 245 10*3/uL (ref 150–400)
RBC: 3.85 MIL/uL — ABNORMAL LOW (ref 4.22–5.81)
RDW: 15.7 % — ABNORMAL HIGH (ref 11.5–15.5)
WBC: 4.9 10*3/uL (ref 4.0–10.5)
nRBC: 0 % (ref 0.0–0.2)

## 2021-06-10 LAB — PHOSPHORUS: Phosphorus: 2.7 mg/dL (ref 2.5–4.6)

## 2021-06-10 LAB — MAGNESIUM: Magnesium: 2 mg/dL (ref 1.7–2.4)

## 2021-06-10 MED ORDER — LIDOCAINE-PRILOCAINE 2.5-2.5 % EX CREA: 1.0000 "application " | TOPICAL_CREAM | CUTANEOUS | 0 refills | Status: DC | PRN

## 2021-06-10 MED ORDER — FAMOTIDINE IN NACL 20-0.9 MG/50ML-% IV SOLN
20.0000 mg | Freq: Once | INTRAVENOUS | Status: AC
  Administered 2021-06-10: 10:00:00 20 mg via INTRAVENOUS
  Filled 2021-06-10: qty 50

## 2021-06-10 MED ORDER — ACETAMINOPHEN 325 MG PO TABS
650.0000 mg | ORAL_TABLET | Freq: Once | ORAL | Status: AC
  Administered 2021-06-10: 10:00:00 650 mg via ORAL
  Filled 2021-06-10: qty 2

## 2021-06-10 MED ORDER — PALONOSETRON HCL INJECTION 0.25 MG/5ML
0.2500 mg | Freq: Once | INTRAVENOUS | Status: AC
  Administered 2021-06-10: 10:00:00 0.25 mg via INTRAVENOUS
  Filled 2021-06-10: qty 5

## 2021-06-10 MED ORDER — SODIUM CHLORIDE 0.9% FLUSH
10.0000 mL | INTRAVENOUS | Status: DC | PRN
  Administered 2021-06-10: 14:00:00 10 mL

## 2021-06-10 MED ORDER — SODIUM CHLORIDE 0.9 % IV SOLN
150.0000 mg | Freq: Once | INTRAVENOUS | Status: AC
  Administered 2021-06-10: 11:00:00 150 mg via INTRAVENOUS
  Filled 2021-06-10: qty 150

## 2021-06-10 MED ORDER — SODIUM CHLORIDE 0.9 % IV SOLN
10.0000 mg | Freq: Once | INTRAVENOUS | Status: AC
  Administered 2021-06-10: 11:00:00 10 mg via INTRAVENOUS
  Filled 2021-06-10: qty 10

## 2021-06-10 MED ORDER — HEPARIN SOD (PORK) LOCK FLUSH 100 UNIT/ML IV SOLN
500.0000 [IU] | Freq: Once | INTRAVENOUS | Status: AC | PRN
  Administered 2021-06-10: 14:00:00 500 [IU]

## 2021-06-10 MED ORDER — SODIUM CHLORIDE 0.9 % IV SOLN: Freq: Once | INTRAVENOUS | Status: AC

## 2021-06-10 MED ORDER — SODIUM CHLORIDE 0.9 % IV SOLN
10.0000 mg/kg | Freq: Once | INTRAVENOUS | Status: AC
  Administered 2021-06-10: 12:00:00 670 mg via INTRAVENOUS
  Filled 2021-06-10: qty 67

## 2021-06-10 NOTE — Progress Notes (Signed)
Patient presents today for chemotherapy infusion.  Patient is in satisfactory condition with no complaints voiced.  Vital signs are stable.  Labs reviewed and all labs are within treatment parameters.  We will proceed with treatment per MD orders.   Patient tolerated treatment well with no complaints voiced.  Patient left ambulatory in stable condition.  Vital signs stable at discharge.  Follow up as scheduled.    

## 2021-06-10 NOTE — Patient Instructions (Signed)
[facility]  Discharge Instructions: Thank you for choosing [facility] to provide your oncology and hematology care.  If you have a lab appointment with the Cancer Center, please come in thru the Main Entrance and check in at the main information desk.  Wear comfortable clothing and clothing appropriate for easy access to any Portacath or PICC line.   We strive to give you quality time with your provider. You may need to reschedule your appointment if you arrive late (15 or more minutes).  Arriving late affects you and other patients whose appointments are after yours.  Also, if you miss three or more appointments without notifying the office, you may be dismissed from the clinic at the provider's discretion.      For prescription refill requests, have your pharmacy contact our office and allow 72 hours for refills to be completed.        To help prevent nausea and vomiting after your treatment, we encourage you to take your nausea medication as directed.  BELOW ARE SYMPTOMS THAT SHOULD BE REPORTED IMMEDIATELY: *FEVER GREATER THAN 100.4 F (38 C) OR HIGHER *CHILLS OR SWEATING *NAUSEA AND VOMITING THAT IS NOT CONTROLLED WITH YOUR NAUSEA MEDICATION *UNUSUAL SHORTNESS OF BREATH *UNUSUAL BRUISING OR BLEEDING *URINARY PROBLEMS (pain or burning when urinating, or frequent urination) *BOWEL PROBLEMS (unusual diarrhea, constipation, pain near the anus) TENDERNESS IN MOUTH AND THROAT WITH OR WITHOUT PRESENCE OF ULCERS (sore throat, sores in mouth, or a toothache) UNUSUAL RASH, SWELLING OR PAIN  UNUSUAL VAGINAL DISCHARGE OR ITCHING   Items with * indicate a potential emergency and should be followed up as soon as possible or go to the Emergency Department if any problems should occur.  Please show the CHEMOTHERAPY ALERT CARD or IMMUNOTHERAPY ALERT CARD at check-in to the Emergency Department and triage nurse.  Should you have questions after your visit or need to cancel  or reschedule your appointment, please contact [facility] [phone]  and follow the prompts.  Office hours are 8:00 a.m. to 4:30 p.m. Monday - Friday. Please note that voicemails left after 4:00 p.m. may not be returned until the following business day.  We are closed weekends and major holidays. You have access to a nurse at all times for urgent questions. Please call the main number to the clinic [phone] and follow the prompts.  For any non-urgent questions, you may also contact your provider using MyChart. We now offer e-Visits for anyone 18 and older to request care online for non-urgent symptoms. For details visit [email or web address].   Also download the MyChart app! Go to the app store, search "MyChart", open the app, select [facility], and log in with your MyChart username and password.  Due to Covid, a mask is required upon entering the hospital/clinic. If you do not have a mask, one will be given to you upon arrival. For doctor visits, patients may have 1 support person aged 18 or older with them. For treatment visits, patients cannot have anyone with them due to current Covid guidelines and our immunocompromised population.  

## 2021-06-14 ENCOUNTER — Other Ambulatory Visit (HOSPITAL_COMMUNITY): Payer: Self-pay | Admitting: *Deleted

## 2021-06-14 MED ORDER — OXYCODONE HCL 20 MG PO TABS: 15.0000 mg | ORAL_TABLET | Freq: Three times a day (TID) | ORAL | 0 refills | Status: DC | PRN

## 2021-06-14 MED ORDER — IBUPROFEN 800 MG PO TABS: 800.0000 mg | ORAL_TABLET | Freq: Two times a day (BID) | ORAL | 0 refills | Status: DC | PRN

## 2021-06-15 ENCOUNTER — Ambulatory Visit: Payer: Medicare Other | Admitting: Internal Medicine

## 2021-06-22 ENCOUNTER — Other Ambulatory Visit (HOSPITAL_COMMUNITY): Payer: Self-pay | Admitting: *Deleted

## 2021-06-22 DIAGNOSIS — K219 Gastro-esophageal reflux disease without esophagitis: Secondary | ICD-10-CM

## 2021-06-22 MED ORDER — PANTOPRAZOLE SODIUM 40 MG PO TBEC: 40.0000 mg | DELAYED_RELEASE_TABLET | Freq: Every day | ORAL | 6 refills | Status: DC

## 2021-06-23 ENCOUNTER — Encounter (HOSPITAL_COMMUNITY): Payer: Self-pay

## 2021-06-23 ENCOUNTER — Inpatient Hospital Stay (HOSPITAL_COMMUNITY): Payer: Medicare Other

## 2021-06-23 ENCOUNTER — Other Ambulatory Visit: Payer: Self-pay

## 2021-06-23 ENCOUNTER — Inpatient Hospital Stay (HOSPITAL_BASED_OUTPATIENT_CLINIC_OR_DEPARTMENT_OTHER): Payer: Medicare Other | Admitting: Hematology

## 2021-06-23 VITALS — BP 156/69 | HR 89 | Temp 98.1°F | Resp 18 | Ht 65.55 in | Wt 155.6 lb

## 2021-06-23 VITALS — BP 154/73 | HR 69 | Temp 98.1°F | Resp 18

## 2021-06-23 DIAGNOSIS — C7910 Secondary malignant neoplasm of unspecified urinary organs: Secondary | ICD-10-CM | POA: Diagnosis not present

## 2021-06-23 DIAGNOSIS — F17210 Nicotine dependence, cigarettes, uncomplicated: Secondary | ICD-10-CM | POA: Diagnosis not present

## 2021-06-23 DIAGNOSIS — R109 Unspecified abdominal pain: Secondary | ICD-10-CM | POA: Diagnosis not present

## 2021-06-23 DIAGNOSIS — D509 Iron deficiency anemia, unspecified: Secondary | ICD-10-CM | POA: Diagnosis not present

## 2021-06-23 DIAGNOSIS — G629 Polyneuropathy, unspecified: Secondary | ICD-10-CM | POA: Diagnosis not present

## 2021-06-23 DIAGNOSIS — Z79899 Other long term (current) drug therapy: Secondary | ICD-10-CM | POA: Diagnosis not present

## 2021-06-23 DIAGNOSIS — K5900 Constipation, unspecified: Secondary | ICD-10-CM | POA: Diagnosis not present

## 2021-06-23 DIAGNOSIS — R1012 Left upper quadrant pain: Secondary | ICD-10-CM | POA: Diagnosis not present

## 2021-06-23 DIAGNOSIS — Z5111 Encounter for antineoplastic chemotherapy: Secondary | ICD-10-CM | POA: Diagnosis not present

## 2021-06-23 DIAGNOSIS — C652 Malignant neoplasm of left renal pelvis: Secondary | ICD-10-CM | POA: Diagnosis not present

## 2021-06-23 DIAGNOSIS — R140 Abdominal distension (gaseous): Secondary | ICD-10-CM | POA: Diagnosis not present

## 2021-06-23 DIAGNOSIS — C787 Secondary malignant neoplasm of liver and intrahepatic bile duct: Secondary | ICD-10-CM | POA: Diagnosis not present

## 2021-06-23 LAB — CBC WITH DIFFERENTIAL/PLATELET
Abs Immature Granulocytes: 0.01 10*3/uL (ref 0.00–0.07)
Basophils Absolute: 0 10*3/uL (ref 0.0–0.1)
Basophils Relative: 1 %
Eosinophils Absolute: 0.1 10*3/uL (ref 0.0–0.5)
Eosinophils Relative: 1 %
HCT: 35.2 % — ABNORMAL LOW (ref 39.0–52.0)
Hemoglobin: 12 g/dL — ABNORMAL LOW (ref 13.0–17.0)
Immature Granulocytes: 0 %
Lymphocytes Relative: 27 %
Lymphs Abs: 1.1 10*3/uL (ref 0.7–4.0)
MCH: 29.8 pg (ref 26.0–34.0)
MCHC: 34.1 g/dL (ref 30.0–36.0)
MCV: 87.3 fL (ref 80.0–100.0)
Monocytes Absolute: 0.6 10*3/uL (ref 0.1–1.0)
Monocytes Relative: 14 %
Neutro Abs: 2.3 10*3/uL (ref 1.7–7.7)
Neutrophils Relative %: 57 %
Platelets: 239 10*3/uL (ref 150–400)
RBC: 4.03 MIL/uL — ABNORMAL LOW (ref 4.22–5.81)
RDW: 15 % (ref 11.5–15.5)
WBC: 4 10*3/uL (ref 4.0–10.5)
nRBC: 0 % (ref 0.0–0.2)

## 2021-06-23 LAB — COMPREHENSIVE METABOLIC PANEL
ALT: 33 U/L (ref 0–44)
AST: 24 U/L (ref 15–41)
Albumin: 3.6 g/dL (ref 3.5–5.0)
Alkaline Phosphatase: 94 U/L (ref 38–126)
Anion gap: 9 (ref 5–15)
BUN: 13 mg/dL (ref 8–23)
CO2: 19 mmol/L — ABNORMAL LOW (ref 22–32)
Calcium: 8.9 mg/dL (ref 8.9–10.3)
Chloride: 109 mmol/L (ref 98–111)
Creatinine, Ser: 1.04 mg/dL (ref 0.61–1.24)
GFR, Estimated: >60 mL/min (ref >60)
Glucose, Bld: 145 mg/dL — ABNORMAL HIGH (ref 70–99)
Potassium: 3.8 mmol/L (ref 3.5–5.1)
Sodium: 137 mmol/L (ref 135–145)
Total Bilirubin: 0.5 mg/dL (ref 0.3–1.2)
Total Protein: 6.8 g/dL (ref 6.5–8.1)

## 2021-06-23 LAB — PHOSPHORUS: Phosphorus: 3.4 mg/dL (ref 2.5–4.6)

## 2021-06-23 LAB — MAGNESIUM: Magnesium: 2.1 mg/dL (ref 1.7–2.4)

## 2021-06-23 MED ORDER — SODIUM CHLORIDE 0.9 % IV SOLN
10.0000 mg/kg | Freq: Once | INTRAVENOUS | Status: AC
  Administered 2021-06-23: 670 mg via INTRAVENOUS
  Filled 2021-06-23: qty 67

## 2021-06-23 MED ORDER — SODIUM CHLORIDE 0.9 % IV SOLN: Freq: Once | INTRAVENOUS | Status: AC

## 2021-06-23 MED ORDER — SODIUM CHLORIDE 0.9 % IV SOLN
10.0000 mg | Freq: Once | INTRAVENOUS | Status: DC
  Filled 2021-06-23: qty 1

## 2021-06-23 MED ORDER — PALONOSETRON HCL INJECTION 0.25 MG/5ML
0.2500 mg | Freq: Once | INTRAVENOUS | Status: AC
  Administered 2021-06-23: 0.25 mg via INTRAVENOUS
  Filled 2021-06-23: qty 5

## 2021-06-23 MED ORDER — SODIUM CHLORIDE 0.9 % IV SOLN
150.0000 mg | Freq: Once | INTRAVENOUS | Status: AC
  Administered 2021-06-23: 150 mg via INTRAVENOUS
  Filled 2021-06-23: qty 5

## 2021-06-23 MED ORDER — HEPARIN SOD (PORK) LOCK FLUSH 100 UNIT/ML IV SOLN
500.0000 [IU] | Freq: Once | INTRAVENOUS | Status: AC | PRN
  Administered 2021-06-23: 500 [IU]

## 2021-06-23 MED ORDER — ACETAMINOPHEN 325 MG PO TABS
650.0000 mg | ORAL_TABLET | Freq: Once | ORAL | Status: AC
  Administered 2021-06-23: 650 mg via ORAL
  Filled 2021-06-23: qty 2

## 2021-06-23 MED ORDER — FAMOTIDINE IN NACL 20-0.9 MG/50ML-% IV SOLN
20.0000 mg | Freq: Once | INTRAVENOUS | Status: AC
  Administered 2021-06-23: 20 mg via INTRAVENOUS
  Filled 2021-06-23: qty 50

## 2021-06-23 MED ORDER — SODIUM CHLORIDE 0.9% FLUSH
10.0000 mL | INTRAVENOUS | Status: DC | PRN
  Administered 2021-06-23: 10 mL

## 2021-06-23 NOTE — Progress Notes (Signed)
Patient presents today for treatment and follow up visit with Dr. Delton Coombes. Labs pending. Vital signs within parameters for treatment. MAR reviewed and updated. Attestation 02/11/21 and consent 02/15/2021.  ? ?Message received from Dr. Delton Coombes / A. Anderson RN to proceed with treatment.  ? ?Per A. Anderson RN / Dr. Delton Coombes patient should not be receiving Decadron 10 mg IV on tx plan moving forward.  ? ?Treatment given today per MD orders. Tolerated infusion without adverse affects. Vital signs stable. No complaints at this time. Discharged from clinic ambulatory in stable condition. Alert and oriented x 3. F/U with [facility] as scheduled.   ?

## 2021-06-23 NOTE — Progress Notes (Signed)
Patient has been examined by Dr. Katragadda, and vital signs and labs have been reviewed. ANC, Creatinine, LFTs, hemoglobin, and platelets are within treatment parameters per M.D. - pt may proceed with treatment.    °

## 2021-06-23 NOTE — Progress Notes (Signed)
? ?[facility] ?[address] ?[place], [place] [address] ? ? ?CLINIC:  ?Medical Oncology/Hematology ? ?PCP:  ?Lindell Spar, MD ?[address] / [place] [address] ?[phone] ? ? ?REASON FOR VISIT:  ?Follow-up for metastatic urothelial carcinoma ? ?PRIOR THERAPY:  ?Keytruda every 3 weeks ?Enfortumab every 4 weeks ? ?NGS Results: not done ? ?CURRENT THERAPY: Sacituzumab govitecan-hziy [name] ? ?BRIEF ONCOLOGIC HISTORY:  ?Oncology History  ?Metastatic urothelial carcinoma ([place])  ?08/13/2020 Initial Diagnosis  ? Metastatic urothelial carcinoma ([place]) ?  ?08/13/2020 Cancer Staging  ? Staging form: Urinary Bladder, AJCC 8th Edition ?- Clinical stage from 08/13/2020: Stage IVB (cTX, cN0, pM1b) - Signed by Derek Jack, MD on 08/13/2020 ?Stage prefix: Initial diagnosis ?WHO/ISUP grade (low/high): High Grade ?Histologic grading system: 2 grade system ? ?  ?08/20/2020 - 10/22/2020 Chemotherapy  ?  ? ?  ? ?  ?11/19/2020 - 01/28/2021 Chemotherapy  ? Patient is on Treatment Plan : UROTHELIAL LOCALLY ADVANCED / METASTATIC Enfortumab q28d  ?   ?02/15/2021 -  Chemotherapy  ? Patient is on Treatment Plan : BLADDER Sacituzumab govitecan-hziy Ivette Loyal) q21d  ?   ? ? ?CANCER STAGING: ? Cancer Staging  ?Metastatic urothelial carcinoma ([place]) ?Staging form: Urinary Bladder, AJCC 8th Edition ?- Clinical stage from 08/13/2020: Stage IVB (cTX, cN0, pM1b) - Signed by Derek Jack, MD on 08/13/2020 ? ? ?INTERVAL HISTORY:  ?[name], a 71 y.o. male, returns for routine follow-up and consideration for next cycle of chemotherapy. Jeremy Rodriguez was last seen on 06/03/2021. ? ?Due for cycle #7 of Sacituzumab today.  ? ?Overall, he tells me he has been feeling pretty well. He takes 1 tablet of ibuprofen in the morning, and he takes 1 tablet of oxycodone at night. He is taking 2 tablets of Senokot daily, and he reports his constipation is now well controlled. He reports stable numbness in his  soles and fingertips, and he denies associated pain. He reports good energy levels. He denies cough and SOB. He reports a mild rash on his lower back.  ? ?Overall, he feels ready for next cycle of chemo today.  ? ? ?REVIEW OF SYSTEMS:  ?Review of Systems  ?Constitutional:  Negative for appetite change and fatigue.  ?Respiratory:  Negative for cough and shortness of breath.   ?Gastrointestinal:  Negative for constipation (well controlled).  ?Skin:  Positive for rash.  ?Neurological:  Positive for numbness (stable).  ?All other systems reviewed and are negative. ? ?PAST MEDICAL/SURGICAL HISTORY:  ?Past Medical History:  ?Diagnosis Date  ? Chronic kidney disease   ? GERD (gastroesophageal reflux disease)   ? Hypertension   ? Medical history non-contributory   ? ?Past Surgical History:  ?Procedure Laterality Date  ? appendectomy    ? PORTACATH PLACEMENT Right 05/26/2021  ? Procedure: INSERTION PORT-A-CATH- IJ;  Surgeon: Rusty Aus, DO;  Location: [facility];  Service: General;  Laterality: Right;  Internal Jugular   ? ? ?SOCIAL HISTORY:  ?Social History  ? ?Socioeconomic History  ? Marital status: Single  ?  Spouse name: Not on file  ? Number of children: Not on file  ? Years of education: Not on file  ? Highest education level: Not on file  ?Occupational History  ? Not on file  ?Tobacco Use  ? Smoking status: Some Days  ? Smokeless tobacco: Never  ? Tobacco comments:  ?  a few cigarettes a day  ?Substance and Sexual Activity  ? Alcohol use: Yes  ?  Comment: occ  ? Drug use: Never  ? Sexual activity: Not on file  ?Other Topics Concern  ? Not on file  ?Social History Narrative  ? Not on file  ? ?Social Determinants of Health  ? ?Financial Resource Strain: Low Risk   ? Difficulty of Paying Living Expenses: Not hard at all  ?Food Insecurity: No Food Insecurity  ? Worried About Charity fundraiser in the Last Year: Never true  ? Ran Out of Food in the Last Year: Never true  ?Transportation Needs: No Transportation  Needs  ? Lack of Transportation (Medical): No  ? Lack of Transportation (Non-Medical): No  ?Physical Activity: Inactive  ? Days of Exercise per Week: 5 days  ? Minutes of Exercise per Session: 0 min  ?Stress: No Stress Concern Present  ? Feeling of Stress : Not at all  ?Social Connections: Moderately Isolated  ? Frequency of Communication with Friends and Family: More than three times a week  ? Frequency of Social Gatherings with Friends and Family: More than three times a week  ? Attends Religious Services: More than 4 times per year  ? Active Member of Clubs or Organizations: No  ? Attends Archivist Meetings: Never  ? Marital Status: Divorced  ?Intimate Partner Violence: Not At Risk  ? Fear of Current or Ex-Partner: No  ? Emotionally Abused: No  ? Physically Abused: No  ? Sexually Abused: No  ? ? ?FAMILY HISTORY:  ?Family History  ?Problem Relation Age of Onset  ? Colon cancer Neg Hx   ? Colon polyps Neg Hx   ? Liver cancer Neg Hx   ? Pancreatic cancer Neg Hx   ? ? ?CURRENT MEDICATIONS:  ?Current Outpatient Medications  ?Medication Sig Dispense Refill  ? ALPRAZolam (XANAX) 1 MG tablet Take 1 tablet (1 mg total) by mouth 2 (two) times daily. 60 tablet 3  ? dicyclomine (BENTYL) 10 MG capsule Take 1 capsule (10 mg total) by mouth 3 (three) times daily as needed for spasms. (Patient taking differently: Take 10 mg by mouth 3 (three) times daily before meals.) 90 capsule 6  ? docusate sodium (COLACE) 100 MG capsule Take 1 capsule (100 mg total) by mouth daily as needed for mild constipation. 30 capsule 0  ? ibuprofen (ADVIL) 800 MG tablet Take 1 tablet (800 mg total) by mouth 2 (two) times daily as needed. 60 tablet 0  ? Lactulose 20 GM/30ML SOLN Take 30 mLs (20 g total) by mouth daily. (Patient taking differently: Take 30 mLs by mouth daily as needed (Constipation).) 473 mL 6  ? lidocaine-prilocaine (EMLA) cream Apply 1 application. topically as needed. 30 g 0  ? linaclotide (LINZESS) 145 MCG CAPS capsule  Take 1 capsule (145 mcg total) by mouth daily before breakfast. 30 capsule 2  ? megestrol (MEGACE) 400 MG/10ML suspension Take 10 mLs (400 mg total) by mouth daily. 480 mL 3  ? Oxycodone HCl 20 MG TABS Take 0.75 tablets (15 mg total) by mouth every 8 (eight) hours as needed. 84 tablet 0  ? pantoprazole (PROTONIX) 40 MG tablet Take 1 tablet (40 mg total) by mouth daily. 30 tablet 6  ? prochlorperazine (COMPAZINE) 10 MG tablet Take 1 tablet (10 mg total) by mouth every 6 (six) hours as needed for nausea or vomiting. 30 tablet 3  ? sennosides-docusate sodium (SENOKOT-S) 8.6-50 MG tablet Take 1 tablet by mouth daily.    ? tadalafil (CIALIS) 10 MG tablet Take 2 tablets (20 mg total) by  mouth daily as needed for erectile dysfunction. (Patient taking differently: Take 10 mg by mouth daily.) 30 tablet 3  ? zolpidem (AMBIEN) 10 MG tablet Take 1 tablet (10 mg total) by mouth at bedtime as needed for sleep. 30 tablet 2  ? ?No current facility-administered medications for this visit.  ? ? ?ALLERGIES:  ?No Known Allergies ? ?PHYSICAL EXAM:  ?Performance status (ECOG): 1 - Symptomatic but completely ambulatory ? ?Vitals:  ? 06/23/21 0816  ?BP: (!) 156/69  ?Pulse: 89  ?Resp: 18  ?Temp: 98.1 ?F (36.7 ?C)  ?SpO2: 100%  ? ?Wt Readings from Last 3 Encounters:  ?06/23/21 155 lb 9.6 oz (70.6 kg)  ?06/10/21 155 lb 3.2 oz (70.4 kg)  ?06/03/21 153 lb 3.2 oz (69.5 kg)  ? ?Physical Exam ?Vitals reviewed.  ?Constitutional:   ?   Appearance: Normal appearance.  ?Cardiovascular:  ?   Rate and Rhythm: Normal rate and regular rhythm.  ?   Pulses: Normal pulses.  ?   Heart sounds: Normal heart sounds.  ?Pulmonary:  ?   Effort: Pulmonary effort is normal.  ?   Breath sounds: Normal breath sounds.  ?Abdominal:  ?   Palpations: Abdomen is soft. There is no mass.  ?   Tenderness: There is abdominal tenderness.  ?Musculoskeletal:  ?   Right lower leg: No edema.  ?   Left lower leg: No edema.  ?Skin: ?   Findings: No rash.  ?Neurological:  ?   General:  No focal deficit present.  ?   Mental Status: He is alert and oriented to person, place, and time.  ?Psychiatric:     ?   Mood and Affect: Mood normal.     ?   Behavior: Behavior normal.  ? ? ?LABORATORY D

## 2021-06-23 NOTE — Patient Instructions (Signed)
[facility] at [facility] ?Discharge Instructions ? ? ?You were seen and examined today by Dr. Delton Coombes. ? ?He reviewed the results of your lab work which are normal/stable. ? ?We will proceed with your treatment today and in 1 week. ? ?Return as scheduled.  ? ? ?Thank you for choosing [facility] at [facility] to provide your oncology and hematology care.  To afford each patient quality time with our provider, please arrive at least 15 minutes before your scheduled appointment time.  ? ?If you have a lab appointment with the [facility] please come in thru the Main Entrance and check in at the main information desk. ? ?You need to re-schedule your appointment should you arrive 10 or more minutes late.  We strive to give you quality time with our providers, and arriving late affects you and other patients whose appointments are after yours.  Also, if you no show three or more times for appointments you may be dismissed from the clinic at the providers discretion.     ?Again, thank you for choosing [facility].  Our hope is that these requests will decrease the amount of time that you wait before being seen by our physicians.       ?_____________________________________________________________ ? ?Should you have questions after your visit to [facility], please contact our office at [phone] and follow the prompts.  Our office hours are 8:00 a.m. and 4:30 p.m. Monday - Friday.  Please note that voicemails left after 4:00 p.m. may not be returned until the following business day.  We are closed weekends and major holidays.  You do have access to a nurse 24-7, just call the main number to the clinic [phone] and do not press any options, hold on the line and a nurse will answer the phone.   ? ?For prescription refill requests, have your pharmacy contact our office and allow 72 hours.   ? ?Due to Covid, you will need to wear a mask  upon entering the hospital. If you do not have a mask, a mask will be given to you at the Main Entrance upon arrival. For doctor visits, patients may have 1 support person age 36 or older with them. For treatment visits, patients can not have anyone with them due to social distancing guidelines and our immunocompromised population.  ? ?   ?

## 2021-06-23 NOTE — Patient Instructions (Signed)
[facility]  Discharge Instructions: ?Thank you for choosing [facility] to provide your oncology and hematology care.  ?If you have a lab appointment with the [facility], please come in thru the Main Entrance and check in at the main information desk. ? ?Wear comfortable clothing and clothing appropriate for easy access to any Portacath or PICC line.  ? ?We strive to give you quality time with your provider. You may need to reschedule your appointment if you arrive late (15 or more minutes).  Arriving late affects you and other patients whose appointments are after yours.  Also, if you miss three or more appointments without notifying the office, you may be dismissed from the clinic at the provider?s discretion.    ?  ?For prescription refill requests, have your pharmacy contact our office and allow 72 hours for refills to be completed.   ? ?Today you received the following chemotherapy : Trodelvy.     ?  ?To help prevent nausea and vomiting after your treatment, we encourage you to take your nausea medication as directed. ? ?BELOW ARE SYMPTOMS THAT SHOULD BE REPORTED IMMEDIATELY: ?*FEVER GREATER THAN 100.4 F (38 ?C) OR HIGHER ?*CHILLS OR SWEATING ?*NAUSEA AND VOMITING THAT IS NOT CONTROLLED WITH YOUR NAUSEA MEDICATION ?*UNUSUAL SHORTNESS OF BREATH ?*UNUSUAL BRUISING OR BLEEDING ?*URINARY PROBLEMS (pain or burning when urinating, or frequent urination) ?*BOWEL PROBLEMS (unusual diarrhea, constipation, pain near the anus) ?TENDERNESS IN MOUTH AND THROAT WITH OR WITHOUT PRESENCE OF ULCERS (sore throat, sores in mouth, or a toothache) ?UNUSUAL RASH, SWELLING OR PAIN  ?UNUSUAL VAGINAL DISCHARGE OR ITCHING  ? ?Items with * indicate a potential emergency and should be followed up as soon as possible or go to the Emergency Department if any problems should occur. ? ?Please show the CHEMOTHERAPY ALERT CARD or IMMUNOTHERAPY ALERT CARD at check-in to the Emergency Department and triage  nurse. ? ?Should you have questions after your visit or need to cancel or reschedule your appointment, please contact [facility] [phone]  and follow the prompts.  Office hours are 8:00 a.m. to 4:30 p.m. Monday - Friday. Please note that voicemails left after 4:00 p.m. may not be returned until the following business day.  We are closed weekends and major holidays. You have access to a nurse at all times for urgent questions. Please call the main number to the clinic [phone] and follow the prompts. ? ?For any non-urgent questions, you may also contact your provider using MyChart. We now offer e-Visits for anyone 72 and older to request care online for non-urgent symptoms. For details visit [email or web address]. ?  ?Also download the MyChart app! Go to the app store, search "MyChart", open the app, select [facility], and log in with your MyChart username and password. ? ?Due to Covid, a mask is required upon entering the hospital/clinic. If you do not have a mask, one will be given to you upon arrival. For doctor visits, patients may have 1 support person aged 28 or older with them. For treatment visits, patients cannot have anyone with them due to current Covid guidelines and our immunocompromised population.  ?

## 2021-06-23 NOTE — Progress Notes (Signed)
Received orders to discontinue dexamethasone IVPB from treatment plan. ? ?T.O. Dr Rhys Martini, PharmD ?

## 2021-06-23 NOTE — Progress Notes (Signed)
Patients port flushed without difficulty.  Good blood return noted with no bruising or swelling noted at site.  Stable during access and blood draw.  Patient to remain accessed for treatment. 

## 2021-07-01 ENCOUNTER — Inpatient Hospital Stay (HOSPITAL_COMMUNITY): Payer: Medicare Other

## 2021-07-01 VITALS — BP 109/84 | HR 67 | Temp 98.6°F | Resp 18

## 2021-07-01 DIAGNOSIS — C7910 Secondary malignant neoplasm of unspecified urinary organs: Secondary | ICD-10-CM

## 2021-07-01 DIAGNOSIS — Z79899 Other long term (current) drug therapy: Secondary | ICD-10-CM | POA: Diagnosis not present

## 2021-07-01 DIAGNOSIS — F17210 Nicotine dependence, cigarettes, uncomplicated: Secondary | ICD-10-CM | POA: Diagnosis not present

## 2021-07-01 DIAGNOSIS — Z5111 Encounter for antineoplastic chemotherapy: Secondary | ICD-10-CM | POA: Diagnosis not present

## 2021-07-01 DIAGNOSIS — D509 Iron deficiency anemia, unspecified: Secondary | ICD-10-CM | POA: Diagnosis not present

## 2021-07-01 DIAGNOSIS — C652 Malignant neoplasm of left renal pelvis: Secondary | ICD-10-CM | POA: Diagnosis not present

## 2021-07-01 DIAGNOSIS — C787 Secondary malignant neoplasm of liver and intrahepatic bile duct: Secondary | ICD-10-CM | POA: Diagnosis not present

## 2021-07-01 DIAGNOSIS — K5900 Constipation, unspecified: Secondary | ICD-10-CM | POA: Diagnosis not present

## 2021-07-01 DIAGNOSIS — G629 Polyneuropathy, unspecified: Secondary | ICD-10-CM | POA: Diagnosis not present

## 2021-07-01 DIAGNOSIS — R1012 Left upper quadrant pain: Secondary | ICD-10-CM | POA: Diagnosis not present

## 2021-07-01 LAB — CBC WITH DIFFERENTIAL/PLATELET
Abs Immature Granulocytes: 0.02 10*3/uL (ref 0.00–0.07)
Basophils Absolute: 0 10*3/uL (ref 0.0–0.1)
Basophils Relative: 1 %
Eosinophils Absolute: 0 10*3/uL (ref 0.0–0.5)
Eosinophils Relative: 1 %
HCT: 34.2 % — ABNORMAL LOW (ref 39.0–52.0)
Hemoglobin: 12 g/dL — ABNORMAL LOW (ref 13.0–17.0)
Immature Granulocytes: 1 %
Lymphocytes Relative: 25 %
Lymphs Abs: 0.9 10*3/uL (ref 0.7–4.0)
MCH: 31.7 pg (ref 26.0–34.0)
MCHC: 35.1 g/dL (ref 30.0–36.0)
MCV: 90.2 fL (ref 80.0–100.0)
Monocytes Absolute: 0.6 10*3/uL (ref 0.1–1.0)
Monocytes Relative: 15 %
Neutro Abs: 2.2 10*3/uL (ref 1.7–7.7)
Neutrophils Relative %: 57 %
Platelets: 242 10*3/uL (ref 150–400)
RBC: 3.79 MIL/uL — ABNORMAL LOW (ref 4.22–5.81)
RDW: 15.3 % (ref 11.5–15.5)
WBC: 3.7 10*3/uL — ABNORMAL LOW (ref 4.0–10.5)
nRBC: 0 % (ref 0.0–0.2)

## 2021-07-01 LAB — COMPREHENSIVE METABOLIC PANEL
ALT: 29 U/L (ref 0–44)
AST: 25 U/L (ref 15–41)
Albumin: 3.6 g/dL (ref 3.5–5.0)
Alkaline Phosphatase: 85 U/L (ref 38–126)
Anion gap: 7 (ref 5–15)
BUN: 15 mg/dL (ref 8–23)
CO2: 20 mmol/L — ABNORMAL LOW (ref 22–32)
Calcium: 8.7 mg/dL — ABNORMAL LOW (ref 8.9–10.3)
Chloride: 109 mmol/L (ref 98–111)
Creatinine, Ser: 0.95 mg/dL (ref 0.61–1.24)
GFR, Estimated: >60 mL/min (ref >60)
Glucose, Bld: 115 mg/dL — ABNORMAL HIGH (ref 70–99)
Potassium: 3.5 mmol/L (ref 3.5–5.1)
Sodium: 136 mmol/L (ref 135–145)
Total Bilirubin: 0.6 mg/dL (ref 0.3–1.2)
Total Protein: 6.6 g/dL (ref 6.5–8.1)

## 2021-07-01 LAB — MAGNESIUM: Magnesium: 2 mg/dL (ref 1.7–2.4)

## 2021-07-01 LAB — PHOSPHORUS: Phosphorus: 2.9 mg/dL (ref 2.5–4.6)

## 2021-07-01 MED ORDER — ACETAMINOPHEN 325 MG PO TABS
650.0000 mg | ORAL_TABLET | Freq: Once | ORAL | Status: AC
  Administered 2021-07-01: 650 mg via ORAL

## 2021-07-01 MED ORDER — FAMOTIDINE IN NACL 20-0.9 MG/50ML-% IV SOLN
INTRAVENOUS | Status: AC
  Filled 2021-07-01: qty 50

## 2021-07-01 MED ORDER — PALONOSETRON HCL INJECTION 0.25 MG/5ML
INTRAVENOUS | Status: AC
  Filled 2021-07-01: qty 5

## 2021-07-01 MED ORDER — SODIUM CHLORIDE 0.9 % IV SOLN: Freq: Once | INTRAVENOUS | Status: AC

## 2021-07-01 MED ORDER — IBUPROFEN 200 MG PO TABS
800.0000 mg | ORAL_TABLET | Freq: Once | ORAL | Status: AC
  Administered 2021-07-01: 800 mg via ORAL
  Filled 2021-07-01: qty 4

## 2021-07-01 MED ORDER — FAMOTIDINE IN NACL 20-0.9 MG/50ML-% IV SOLN
20.0000 mg | Freq: Once | INTRAVENOUS | Status: AC
  Administered 2021-07-01: 20 mg via INTRAVENOUS

## 2021-07-01 MED ORDER — HEPARIN SOD (PORK) LOCK FLUSH 100 UNIT/ML IV SOLN
500.0000 [IU] | Freq: Once | INTRAVENOUS | Status: AC | PRN
  Administered 2021-07-01: 500 [IU]

## 2021-07-01 MED ORDER — SODIUM CHLORIDE 0.9 % IV SOLN
10.0000 mg/kg | Freq: Once | INTRAVENOUS | Status: AC
  Administered 2021-07-01: 670 mg via INTRAVENOUS
  Filled 2021-07-01: qty 67

## 2021-07-01 MED ORDER — PALONOSETRON HCL INJECTION 0.25 MG/5ML
0.2500 mg | Freq: Once | INTRAVENOUS | Status: AC
  Administered 2021-07-01: 0.25 mg via INTRAVENOUS

## 2021-07-01 MED ORDER — ACETAMINOPHEN 325 MG PO TABS
ORAL_TABLET | ORAL | Status: AC
  Filled 2021-07-01: qty 2

## 2021-07-01 MED ORDER — SODIUM CHLORIDE 0.9% FLUSH
10.0000 mL | INTRAVENOUS | Status: DC | PRN
  Administered 2021-07-01: 10 mL

## 2021-07-01 MED ORDER — SODIUM CHLORIDE 0.9 % IV SOLN
150.0000 mg | Freq: Once | INTRAVENOUS | Status: AC
  Administered 2021-07-01: 150 mg via INTRAVENOUS
  Filled 2021-07-01: qty 150

## 2021-07-01 NOTE — Progress Notes (Signed)
Labs reviewed today. Proceed as planned. ? ?1415-patient called out and voiced he had been having sharp stabbing pain in his left side for about an hour now. Patient states "he has never had a pain like this before". Trodelvyl stopped and MD notified.  ? ?1450-MD in room to evaluate pt. Gave ibuprofen per orders and restarted infusion per MD.  ? ?Patient tolerated the rest of the infusion. No other pain noted by patient. ? ?Treatment given per orders. Vitals stable and discharged home from clinic ambulatory. Follow up as scheduled. ? ?

## 2021-07-12 ENCOUNTER — Other Ambulatory Visit (HOSPITAL_COMMUNITY): Payer: Self-pay

## 2021-07-12 MED ORDER — OXYCODONE HCL 20 MG PO TABS: 15.0000 mg | ORAL_TABLET | Freq: Three times a day (TID) | ORAL | 0 refills | Status: DC | PRN

## 2021-07-14 ENCOUNTER — Inpatient Hospital Stay (HOSPITAL_COMMUNITY): Payer: Medicare Other | Attending: Hematology

## 2021-07-14 ENCOUNTER — Encounter (HOSPITAL_COMMUNITY): Payer: Self-pay | Admitting: Hematology

## 2021-07-14 ENCOUNTER — Inpatient Hospital Stay (HOSPITAL_COMMUNITY): Payer: Medicare Other

## 2021-07-14 ENCOUNTER — Inpatient Hospital Stay (HOSPITAL_BASED_OUTPATIENT_CLINIC_OR_DEPARTMENT_OTHER): Payer: Medicare Other | Admitting: Hematology

## 2021-07-14 VITALS — BP 130/76 | HR 78 | Temp 97.9°F | Resp 18

## 2021-07-14 DIAGNOSIS — C7910 Secondary malignant neoplasm of unspecified urinary organs: Secondary | ICD-10-CM

## 2021-07-14 DIAGNOSIS — C679 Malignant neoplasm of bladder, unspecified: Secondary | ICD-10-CM | POA: Insufficient documentation

## 2021-07-14 DIAGNOSIS — C787 Secondary malignant neoplasm of liver and intrahepatic bile duct: Secondary | ICD-10-CM | POA: Insufficient documentation

## 2021-07-14 DIAGNOSIS — Z5111 Encounter for antineoplastic chemotherapy: Secondary | ICD-10-CM | POA: Insufficient documentation

## 2021-07-14 LAB — COMPREHENSIVE METABOLIC PANEL
ALT: 28 U/L (ref 0–44)
AST: 26 U/L (ref 15–41)
Albumin: 3.5 g/dL (ref 3.5–5.0)
Alkaline Phosphatase: 93 U/L (ref 38–126)
Anion gap: 5 (ref 5–15)
BUN: 13 mg/dL (ref 8–23)
CO2: 21 mmol/L — ABNORMAL LOW (ref 22–32)
Calcium: 9 mg/dL (ref 8.9–10.3)
Chloride: 111 mmol/L (ref 98–111)
Creatinine, Ser: 0.95 mg/dL (ref 0.61–1.24)
GFR, Estimated: >60 mL/min (ref >60)
Glucose, Bld: 110 mg/dL — ABNORMAL HIGH (ref 70–99)
Potassium: 3.9 mmol/L (ref 3.5–5.1)
Sodium: 137 mmol/L (ref 135–145)
Total Bilirubin: 0.5 mg/dL (ref 0.3–1.2)
Total Protein: 6.6 g/dL (ref 6.5–8.1)

## 2021-07-14 LAB — CBC WITH DIFFERENTIAL/PLATELET
Abs Immature Granulocytes: 0.01 10*3/uL (ref 0.00–0.07)
Basophils Absolute: 0 10*3/uL (ref 0.0–0.1)
Basophils Relative: 1 %
Eosinophils Absolute: 0.1 10*3/uL (ref 0.0–0.5)
Eosinophils Relative: 2 %
HCT: 34.2 % — ABNORMAL LOW (ref 39.0–52.0)
Hemoglobin: 11.7 g/dL — ABNORMAL LOW (ref 13.0–17.0)
Immature Granulocytes: 0 %
Lymphocytes Relative: 26 %
Lymphs Abs: 0.9 10*3/uL (ref 0.7–4.0)
MCH: 31 pg (ref 26.0–34.0)
MCHC: 34.2 g/dL (ref 30.0–36.0)
MCV: 90.5 fL (ref 80.0–100.0)
Monocytes Absolute: 0.5 10*3/uL (ref 0.1–1.0)
Monocytes Relative: 16 %
Neutro Abs: 2 10*3/uL (ref 1.7–7.7)
Neutrophils Relative %: 55 %
Platelets: 217 10*3/uL (ref 150–400)
RBC: 3.78 MIL/uL — ABNORMAL LOW (ref 4.22–5.81)
RDW: 15.6 % — ABNORMAL HIGH (ref 11.5–15.5)
WBC: 3.5 10*3/uL — ABNORMAL LOW (ref 4.0–10.5)
nRBC: 0 % (ref 0.0–0.2)

## 2021-07-14 LAB — MAGNESIUM: Magnesium: 2 mg/dL (ref 1.7–2.4)

## 2021-07-14 LAB — PHOSPHORUS: Phosphorus: 3.4 mg/dL (ref 2.5–4.6)

## 2021-07-14 MED ORDER — METHYLPREDNISOLONE SODIUM SUCC 125 MG IJ SOLR
125.0000 mg | Freq: Once | INTRAMUSCULAR | Status: AC
  Administered 2021-07-14: 125 mg via INTRAVENOUS
  Filled 2021-07-14: qty 2

## 2021-07-14 MED ORDER — FAMOTIDINE IN NACL 20-0.9 MG/50ML-% IV SOLN
20.0000 mg | Freq: Once | INTRAVENOUS | Status: AC
  Administered 2021-07-14: 20 mg via INTRAVENOUS
  Filled 2021-07-14: qty 50

## 2021-07-14 MED ORDER — SODIUM CHLORIDE 0.9 % IV SOLN: Freq: Once | INTRAVENOUS | Status: AC

## 2021-07-14 MED ORDER — ACETAMINOPHEN 325 MG PO TABS
650.0000 mg | ORAL_TABLET | Freq: Once | ORAL | Status: AC
  Administered 2021-07-14: 650 mg via ORAL
  Filled 2021-07-14: qty 2

## 2021-07-14 MED ORDER — SODIUM CHLORIDE 0.9% FLUSH
10.0000 mL | INTRAVENOUS | Status: DC | PRN
  Administered 2021-07-14: 10 mL

## 2021-07-14 MED ORDER — HEPARIN SOD (PORK) LOCK FLUSH 100 UNIT/ML IV SOLN
500.0000 [IU] | Freq: Once | INTRAVENOUS | Status: AC | PRN
  Administered 2021-07-14: 500 [IU]

## 2021-07-14 MED ORDER — SODIUM CHLORIDE 0.9 % IV SOLN
150.0000 mg | Freq: Once | INTRAVENOUS | Status: AC
  Administered 2021-07-14: 150 mg via INTRAVENOUS
  Filled 2021-07-14: qty 5

## 2021-07-14 MED ORDER — PALONOSETRON HCL INJECTION 0.25 MG/5ML
0.2500 mg | Freq: Once | INTRAVENOUS | Status: AC
  Administered 2021-07-14: 0.25 mg via INTRAVENOUS
  Filled 2021-07-14: qty 5

## 2021-07-14 MED ORDER — SODIUM CHLORIDE 0.9 % IV SOLN
10.0000 mg/kg | Freq: Once | INTRAVENOUS | Status: AC
  Administered 2021-07-14: 670 mg via INTRAVENOUS
  Filled 2021-07-14: qty 67

## 2021-07-14 MED ORDER — SODIUM CHLORIDE 0.9 % IV SOLN
10.0000 mg | Freq: Once | INTRAVENOUS | Status: AC
  Administered 2021-07-14: 10 mg via INTRAVENOUS
  Filled 2021-07-14: qty 1

## 2021-07-14 MED ORDER — LORATADINE 10 MG PO TABS
10.0000 mg | ORAL_TABLET | Freq: Once | ORAL | Status: AC
  Administered 2021-07-14: 10 mg via ORAL
  Filled 2021-07-14: qty 1

## 2021-07-14 NOTE — Progress Notes (Signed)
? ?[facility] ?[address] ?[place], [place] [address] ? ? ?CLINIC:  ?Medical Oncology/Hematology ? ?PCP:  ?Lindell Spar, MD ?[address] / [place] [address] ?[phone] ? ? ?REASON FOR VISIT:  ?Follow-up for metastatic urothelial carcinoma ? ?PRIOR THERAPY:  ?Keytruda every 3 weeks ?Enfortumab every 4 weeks ? ?NGS Results: not done ? ?CURRENT THERAPY: Sacituzumab govitecan-hziy [name] ? ?BRIEF ONCOLOGIC HISTORY:  ?Oncology History  ?Metastatic urothelial carcinoma ([facility])  ?08/13/2020 Initial Diagnosis  ? Metastatic urothelial carcinoma ([place]) ?  ?08/13/2020 Cancer Staging  ? Staging form: Urinary Bladder, AJCC 8th Edition ?- Clinical stage from 08/13/2020: Stage IVB (cTX, cN0, pM1b) - Signed by Derek Jack, MD on 08/13/2020 ?Stage prefix: Initial diagnosis ?WHO/ISUP grade (low/high): High Grade ?Histologic grading system: 2 grade system ? ?  ?08/20/2020 - 10/22/2020 Chemotherapy  ?  ? ?  ? ?  ?11/19/2020 - 01/28/2021 Chemotherapy  ? Patient is on Treatment Plan : UROTHELIAL LOCALLY ADVANCED / METASTATIC Enfortumab q28d  ?   ?02/15/2021 -  Chemotherapy  ? Patient is on Treatment Plan : BLADDER Sacituzumab govitecan-hziy Ivette Loyal) q21d  ?   ? ? ?CANCER STAGING: ? Cancer Staging  ?Metastatic urothelial carcinoma ([place]) ?Staging form: Urinary Bladder, AJCC 8th Edition ?- Clinical stage from 08/13/2020: Stage IVB (cTX, cN0, pM1b) - Signed by Derek Jack, MD on 08/13/2020 ? ? ?INTERVAL HISTORY:  ?[name], a 71 y.o. male, returns for routine follow-up of his metastatic urothelial carcinoma. M[place]ade was last seen on 06/23/2021.  ? ?Today he reports feeling fair. He reports severe sharp intermittent left sided abdominal pain for the past 2 weeks that occurs every couple of hours and lasts for intervals of 5 seconds. He is taking oxycodone 2-3 times daily. He is taking Senokot for constipation. He denies back pain. The numbness in his fingertips and toes are  stable. He also reports abdominal pain after eating.  ? ?REVIEW OF SYSTEMS:  ?Review of Systems  ?Gastrointestinal:  Positive for abdominal pain (10/10 L) and constipation.  ?Musculoskeletal:  Negative for back pain.  ?Neurological:  Positive for numbness (stable).  ?All other systems reviewed and are negative. ? ?PAST MEDICAL/SURGICAL HISTORY:  ?Past Medical History:  ?Diagnosis Date  ? Chronic kidney disease   ? GERD (gastroesophageal reflux disease)   ? Hypertension   ? Medical history non-contributory   ? ?Past Surgical History:  ?Procedure Laterality Date  ? appendectomy    ? PORTACATH PLACEMENT Right 05/26/2021  ? Procedure: INSERTION PORT-A-CATH- IJ;  Surgeon: Rusty Aus, DO;  Location: [facility];  Service: General;  Laterality: Right;  Internal Jugular   ? ? ?SOCIAL HISTORY:  ?Social History  ? ?Socioeconomic History  ? Marital status: Single  ?  Spouse name: Not on file  ? Number of children: Not on file  ? Years of education: Not on file  ? Highest education level: Not on file  ?Occupational History  ? Not on file  ?Tobacco Use  ? Smoking status: Some Days  ? Smokeless tobacco: Never  ? Tobacco comments:  ?  a few cigarettes a day  ?Substance and Sexual Activity  ? Alcohol use: Yes  ?  Comment: occ  ? Drug use: Never  ? Sexual activity: Not on file  ?Other Topics Concern  ? Not on file  ?Social History Narrative  ? Not on file  ? ?Social Determinants of Health  ? ?Financial Resource Strain: Low Risk   ? Difficulty of  Paying Living Expenses: Not hard at all  ?Food Insecurity: No Food Insecurity  ? Worried About Charity fundraiser in the Last Year: Never true  ? Ran Out of Food in the Last Year: Never true  ?Transportation Needs: No Transportation Needs  ? Lack of Transportation (Medical): No  ? Lack of Transportation (Non-Medical): No  ?Physical Activity: Inactive  ? Days of Exercise per Week: 5 days  ? Minutes of Exercise per Session: 0 min  ?Stress: No Stress Concern Present  ? Feeling of Stress  : Not at all  ?Social Connections: Moderately Isolated  ? Frequency of Communication with Friends and Family: More than three times a week  ? Frequency of Social Gatherings with Friends and Family: More than three times a week  ? Attends Religious Services: More than 4 times per year  ? Active Member of Clubs or Organizations: No  ? Attends Archivist Meetings: Never  ? Marital Status: Divorced  ?Intimate Partner Violence: Not At Risk  ? Fear of Current or Ex-Partner: No  ? Emotionally Abused: No  ? Physically Abused: No  ? Sexually Abused: No  ? ? ?FAMILY HISTORY:  ?Family History  ?Problem Relation Age of Onset  ? Colon cancer Neg Hx   ? Colon polyps Neg Hx   ? Liver cancer Neg Hx   ? Pancreatic cancer Neg Hx   ? ? ?CURRENT MEDICATIONS:  ?Current Outpatient Medications  ?Medication Sig Dispense Refill  ? ALPRAZolam (XANAX) 1 MG tablet Take 1 tablet (1 mg total) by mouth 2 (two) times daily. 60 tablet 3  ? dicyclomine (BENTYL) 10 MG capsule Take 1 capsule (10 mg total) by mouth 3 (three) times daily as needed for spasms. (Patient taking differently: Take 10 mg by mouth 3 (three) times daily before meals.) 90 capsule 6  ? docusate sodium (COLACE) 100 MG capsule Take 1 capsule (100 mg total) by mouth daily as needed for mild constipation. 30 capsule 0  ? ibuprofen (ADVIL) 800 MG tablet Take 1 tablet (800 mg total) by mouth 2 (two) times daily as needed. 60 tablet 0  ? Lactulose 20 GM/30ML SOLN Take 30 mLs (20 g total) by mouth daily. (Patient taking differently: Take 30 mLs by mouth daily as needed (Constipation).) 473 mL 6  ? lidocaine-prilocaine (EMLA) cream Apply 1 application. topically as needed. 30 g 0  ? linaclotide (LINZESS) 145 MCG CAPS capsule Take 1 capsule (145 mcg total) by mouth daily before breakfast. 30 capsule 2  ? megestrol (MEGACE) 400 MG/10ML suspension Take 10 mLs (400 mg total) by mouth daily. 480 mL 3  ? Oxycodone HCl 20 MG TABS Take 0.75 tablets (15 mg total) by mouth every 8 (eight)  hours as needed. 84 tablet 0  ? pantoprazole (PROTONIX) 40 MG tablet Take 1 tablet (40 mg total) by mouth daily. 30 tablet 6  ? prochlorperazine (COMPAZINE) 10 MG tablet Take 1 tablet (10 mg total) by mouth every 6 (six) hours as needed for nausea or vomiting. 30 tablet 3  ? sennosides-docusate sodium (SENOKOT-S) 8.6-50 MG tablet Take 1 tablet by mouth daily.    ? tadalafil (CIALIS) 10 MG tablet Take 2 tablets (20 mg total) by mouth daily as needed for erectile dysfunction. (Patient taking differently: Take 10 mg by mouth daily.) 30 tablet 3  ? zolpidem (AMBIEN) 10 MG tablet Take 1 tablet (10 mg total) by mouth at bedtime as needed for sleep. 30 tablet 2  ? ?No current facility-administered medications for  this visit.  ? ?Facility-Administered Medications Ordered in Other Visits  ?Medication Dose Route Frequency Provider Last Rate Last Admin  ? acetaminophen (TYLENOL) 325 MG tablet           ? famotidine (PEPCID) 20-0.9 MG/50ML-% IVPB           ? palonosetron (ALOXI) 0.25 MG/5ML injection           ? ? ?ALLERGIES:  ?No Known Allergies ? ?PHYSICAL EXAM:  ?Performance status (ECOG): 1 - Symptomatic but completely ambulatory ? ?There were no vitals filed for this visit. ?Wt Readings from Last 3 Encounters:  ?07/14/21 154 lb 3.2 oz (69.9 kg)  ?07/01/21 153 lb 9.6 oz (69.7 kg)  ?06/23/21 155 lb 9.6 oz (70.6 kg)  ? ?Physical Exam ?Vitals reviewed.  ?Constitutional:   ?   Appearance: Normal appearance.  ?Cardiovascular:  ?   Rate and Rhythm: Normal rate and regular rhythm.  ?   Pulses: Normal pulses.  ?   Heart sounds: Normal heart sounds.  ?Pulmonary:  ?   Effort: Pulmonary effort is normal.  ?   Breath sounds: Normal breath sounds.  ?Abdominal:  ?   Palpations: Abdomen is soft. There is no mass.  ?   Tenderness: There is no abdominal tenderness.  ?Neurological:  ?   General: No focal deficit present.  ?   Mental Status: He is alert and oriented to person, place, and time.  ?Psychiatric:     ?   Mood and Affect: Mood  normal.     ?   Behavior: Behavior normal.  ?  ? ?LABORATORY DATA:  ?I have reviewed the labs as listed.  ? ?  Latest Ref Rng & Units 07/14/2021  ?  9:15 AM 07/01/2021  ? 10:02 AM 06/23/2021  ?  8:09 AM  ?

## 2021-07-14 NOTE — Progress Notes (Signed)
Patient has been examined by Dr. Katragadda, and vital signs and labs have been reviewed. ANC, Creatinine, LFTs, hemoglobin, and platelets are within treatment parameters per M.D. - pt may proceed with treatment.    °

## 2021-07-14 NOTE — Patient Instructions (Addendum)
Jeremy Rodriguez at [facility] ?Discharge Instructions ? ? ?You were seen and examined today by Dr. Delton Coombes. ? ?He reviewed your lab work which is normal/stable. ? ?We will proceed with your treatment today. We will add back the steroid to your treatment plan to see if this makes a difference with your left sided pain. ? ?Return as scheduled.  ? ? ?Thank you for choosing [place] at [facility] to provide your oncology and hematology care.  To afford each patient quality time with our provider, please arrive at least 15 minutes before your scheduled appointment time.  ? ?If you have a lab appointment with the [place] please come in thru the Main Entrance and check in at the main information desk. ? ?You need to re-schedule your appointment should you arrive 10 or more minutes late.  We strive to give you quality time with our providers, and arriving late affects you and other patients whose appointments are after yours.  Also, if you no show three or more times for appointments you may be dismissed from the clinic at the providers discretion.     ?Again, thank you for choosing [facility].  Our hope is that these requests will decrease the amount of time that you wait before being seen by our physicians.       ?_____________________________________________________________ ? ?Should you have questions after your visit to [facility], please contact our office at [phone] and follow the prompts.  Our office hours are 8:00 a.m. and 4:30 p.m. Monday - Friday.  Please note that voicemails left after 4:00 p.m. may not be returned until the following business day.  We are closed weekends and major holidays.  You do have access to a nurse 24-7, just call the main number to the clinic [phone] and do not press any options, hold on the line and a nurse will answer the phone.   ? ?For prescription refill requests, have your pharmacy contact  our office and allow 72 hours.   ? ?Due to Covid, you will need to wear a mask upon entering the hospital. If you do not have a mask, a mask will be given to you at the Main Entrance upon arrival. For doctor visits, patients may have 1 support person age 14 or older with them. For treatment visits, patients can not have anyone with them due to social distancing guidelines and our immunocompromised population.  ? ?   ?

## 2021-07-14 NOTE — Patient Instructions (Signed)
[facility]  Discharge Instructions: Thank you for choosing [facility] to provide your oncology and hematology care.  If you have a lab appointment with the Cancer Center, please come in thru the Main Entrance and check in at the main information desk.  Wear comfortable clothing and clothing appropriate for easy access to any Portacath or PICC line.   We strive to give you quality time with your provider. You may need to reschedule your appointment if you arrive late (15 or more minutes).  Arriving late affects you and other patients whose appointments are after yours.  Also, if you miss three or more appointments without notifying the office, you may be dismissed from the clinic at the provider's discretion.      For prescription refill requests, have your pharmacy contact our office and allow 72 hours for refills to be completed.        To help prevent nausea and vomiting after your treatment, we encourage you to take your nausea medication as directed.  BELOW ARE SYMPTOMS THAT SHOULD BE REPORTED IMMEDIATELY: *FEVER GREATER THAN 100.4 F (38 C) OR HIGHER *CHILLS OR SWEATING *NAUSEA AND VOMITING THAT IS NOT CONTROLLED WITH YOUR NAUSEA MEDICATION *UNUSUAL SHORTNESS OF BREATH *UNUSUAL BRUISING OR BLEEDING *URINARY PROBLEMS (pain or burning when urinating, or frequent urination) *BOWEL PROBLEMS (unusual diarrhea, constipation, pain near the anus) TENDERNESS IN MOUTH AND THROAT WITH OR WITHOUT PRESENCE OF ULCERS (sore throat, sores in mouth, or a toothache) UNUSUAL RASH, SWELLING OR PAIN  UNUSUAL VAGINAL DISCHARGE OR ITCHING   Items with * indicate a potential emergency and should be followed up as soon as possible or go to the Emergency Department if any problems should occur.  Please show the CHEMOTHERAPY ALERT CARD or IMMUNOTHERAPY ALERT CARD at check-in to the Emergency Department and triage nurse.  Should you have questions after your visit or need to cancel  or reschedule your appointment, please contact  CANCER CENTER [phone]  and follow the prompts.  Office hours are 8:00 a.m. to 4:30 p.m. Monday - Friday. Please note that voicemails left after 4:00 p.m. may not be returned until the following business day.  We are closed weekends and major holidays. You have access to a nurse at all times for urgent questions. Please call the main number to the clinic [phone] and follow the prompts.  For any non-urgent questions, you may also contact your provider using MyChart. We now offer e-Visits for anyone 18 and older to request care online for non-urgent symptoms. For details visit [email or web address].   Also download the MyChart app! Go to the app store, search "MyChart", open the app, select [place], and log in with your MyChart username and password.  Due to Covid, a mask is required upon entering the hospital/clinic. If you do not have a mask, one will be given to you upon arrival. For doctor visits, patients may have 1 support person aged 18 or older with them. For treatment visits, patients cannot have anyone with them due to current Covid guidelines and our immunocompromised population.  

## 2021-07-14 NOTE — Progress Notes (Signed)
Patients port flushed without difficulty.  Good blood return noted with no bruising or swelling noted at site.  Patient remains accessed for chemotherapy treatment.  

## 2021-07-14 NOTE — Progress Notes (Signed)
Patient presents today for chemotherapy infusion.  Patient is in satisfactory condition with only complaints of abdominal pain since his last treatment.  MD was made aware.  Dr. Delton Coombes added dexamethasone back into his treatment to hopefully help with abdominal pain.  Labs reviewed by Dr. Delton Coombes during his office visit.  All labs are within treatment parameters.  We will proceed with treatment per MD orders.  ? ?See progress note from C. Page, Therapist, sports.  Patient had a reaction to chemotherapy. ? ?1330- Dr. Delton Coombes came in to re--evaluate patient.  He still complained of chest pressure, but stated it was 50% improved.  Dr. Delton Coombes made changes to his treatment plan and advised patient to take Claritin 10 mg at least one hour before his treatment.  Patient aware and verbalized understanding. ? ?1340-  Patient states chest pressure has completely resolved.  Dr. Delton Coombes notified.  Patient is ok to be discharged per Dr. Delton Coombes.  ? ?Patient left ambulatory in stable condition.  Vital signs stable at discharge.  Follow up as scheduled.    ?

## 2021-07-14 NOTE — Progress Notes (Signed)
1235- patient called out for nurse, patient has a flushed face, hot, patient nose is stopped up, says he can't breath through it. Nurse stops Trodelvy and saline. MD notified, vitals obtained and stable. Patient reports chest feels heavy like something is on top of it, like pressure. New bag of saline hung and administered as bolus per MD. Will give Claritin and solumedrol per orders. Will monitor per MD till symptoms subside.  ? ?1253-patient states his left side is starting to hurt like it did at his last treatment. Its mild per pt.  ?

## 2021-07-16 NOTE — Progress Notes (Signed)
Per Dr Delton Coombes adjusting premedication for infusion reaction: ? ?Pepcid 40 mg IVPB x 1 ?Solu-Medrol 125 mg IVP x 1 ?Tylenol 650 mg po x 1 ?Singulair 10 mg po x 1 ?Confirm with patient he took Claritin po prior to arrival. ? ?Discontinue Emend and Dexamethasone. ? ?Dr Delton Coombes has updated the plan to reflect above. ? ?T.O. Dr Rhys Martini, PharmD ?07/16/21 @ 1500 ?

## 2021-07-21 ENCOUNTER — Inpatient Hospital Stay (HOSPITAL_COMMUNITY): Payer: Medicare Other

## 2021-07-21 DIAGNOSIS — C787 Secondary malignant neoplasm of liver and intrahepatic bile duct: Secondary | ICD-10-CM | POA: Diagnosis not present

## 2021-07-21 DIAGNOSIS — C7910 Secondary malignant neoplasm of unspecified urinary organs: Secondary | ICD-10-CM

## 2021-07-21 DIAGNOSIS — Z5111 Encounter for antineoplastic chemotherapy: Secondary | ICD-10-CM | POA: Diagnosis not present

## 2021-07-21 DIAGNOSIS — C679 Malignant neoplasm of bladder, unspecified: Secondary | ICD-10-CM | POA: Diagnosis not present

## 2021-07-21 LAB — CBC WITH DIFFERENTIAL/PLATELET
Abs Immature Granulocytes: 0.03 10*3/uL (ref 0.00–0.07)
Basophils Absolute: 0 10*3/uL (ref 0.0–0.1)
Basophils Relative: 1 %
Eosinophils Absolute: 0.1 10*3/uL (ref 0.0–0.5)
Eosinophils Relative: 1 %
HCT: 37.2 % — ABNORMAL LOW (ref 39.0–52.0)
Hemoglobin: 12.7 g/dL — ABNORMAL LOW (ref 13.0–17.0)
Immature Granulocytes: 1 %
Lymphocytes Relative: 20 %
Lymphs Abs: 1.3 10*3/uL (ref 0.7–4.0)
MCH: 30.7 pg (ref 26.0–34.0)
MCHC: 34.1 g/dL (ref 30.0–36.0)
MCV: 89.9 fL (ref 80.0–100.0)
Monocytes Absolute: 0.9 10*3/uL (ref 0.1–1.0)
Monocytes Relative: 15 %
Neutro Abs: 3.9 10*3/uL (ref 1.7–7.7)
Neutrophils Relative %: 62 %
Platelets: 215 10*3/uL (ref 150–400)
RBC: 4.14 MIL/uL — ABNORMAL LOW (ref 4.22–5.81)
RDW: 15.4 % (ref 11.5–15.5)
WBC: 6.3 10*3/uL (ref 4.0–10.5)
nRBC: 0 % (ref 0.0–0.2)

## 2021-07-21 LAB — COMPREHENSIVE METABOLIC PANEL
ALT: 35 U/L (ref 0–44)
AST: 32 U/L (ref 15–41)
Albumin: 3.6 g/dL (ref 3.5–5.0)
Alkaline Phosphatase: 102 U/L (ref 38–126)
Anion gap: 8 (ref 5–15)
BUN: 14 mg/dL (ref 8–23)
CO2: 21 mmol/L — ABNORMAL LOW (ref 22–32)
Calcium: 8.9 mg/dL (ref 8.9–10.3)
Chloride: 108 mmol/L (ref 98–111)
Creatinine, Ser: 1.01 mg/dL (ref 0.61–1.24)
GFR, Estimated: >60 mL/min (ref >60)
Glucose, Bld: 159 mg/dL — ABNORMAL HIGH (ref 70–99)
Potassium: 3.8 mmol/L (ref 3.5–5.1)
Sodium: 137 mmol/L (ref 135–145)
Total Bilirubin: 0.6 mg/dL (ref 0.3–1.2)
Total Protein: 6.8 g/dL (ref 6.5–8.1)

## 2021-07-21 LAB — MAGNESIUM: Magnesium: 2.2 mg/dL (ref 1.7–2.4)

## 2021-07-21 LAB — PHOSPHORUS: Phosphorus: 2.7 mg/dL (ref 2.5–4.6)

## 2021-07-21 MED ORDER — PALONOSETRON HCL INJECTION 0.25 MG/5ML
0.2500 mg | Freq: Once | INTRAVENOUS | Status: AC
  Administered 2021-07-21: 0.25 mg via INTRAVENOUS
  Filled 2021-07-21: qty 5

## 2021-07-21 MED ORDER — SODIUM CHLORIDE 0.9 % IV SOLN
40.0000 mg | Freq: Once | INTRAVENOUS | Status: AC
  Administered 2021-07-21: 40 mg via INTRAVENOUS
  Filled 2021-07-21: qty 4

## 2021-07-21 MED ORDER — SODIUM CHLORIDE 0.9 % IV SOLN: Freq: Once | INTRAVENOUS | Status: AC

## 2021-07-21 MED ORDER — SODIUM CHLORIDE 0.9 % IV SOLN
10.0000 mg/kg | Freq: Once | INTRAVENOUS | Status: AC
  Administered 2021-07-21: 670 mg via INTRAVENOUS
  Filled 2021-07-21: qty 67

## 2021-07-21 MED ORDER — ACETAMINOPHEN 325 MG PO TABS
650.0000 mg | ORAL_TABLET | Freq: Once | ORAL | Status: AC
  Administered 2021-07-21: 650 mg via ORAL
  Filled 2021-07-21: qty 2

## 2021-07-21 MED ORDER — SODIUM CHLORIDE 0.9 % IV SOLN
40.0000 mg | Freq: Once | INTRAVENOUS | Status: DC
  Filled 2021-07-21: qty 4

## 2021-07-21 MED ORDER — HEPARIN SOD (PORK) LOCK FLUSH 100 UNIT/ML IV SOLN
500.0000 [IU] | Freq: Once | INTRAVENOUS | Status: AC | PRN
  Administered 2021-07-21: 500 [IU]

## 2021-07-21 MED ORDER — SODIUM CHLORIDE 0.9% FLUSH
10.0000 mL | INTRAVENOUS | Status: DC | PRN
  Administered 2021-07-21: 10 mL

## 2021-07-21 MED ORDER — MONTELUKAST SODIUM 10 MG PO TABS
10.0000 mg | ORAL_TABLET | Freq: Once | ORAL | Status: AC
  Administered 2021-07-21: 10 mg via ORAL
  Filled 2021-07-21: qty 1

## 2021-07-21 MED ORDER — IBUPROFEN 800 MG PO TABS: 800.0000 mg | ORAL_TABLET | Freq: Two times a day (BID) | ORAL | 5 refills | Status: DC | PRN

## 2021-07-21 MED ORDER — METHYLPREDNISOLONE SODIUM SUCC 125 MG IJ SOLR
125.0000 mg | Freq: Once | INTRAMUSCULAR | Status: AC
  Administered 2021-07-21: 125 mg via INTRAVENOUS
  Filled 2021-07-21: qty 2

## 2021-07-21 NOTE — Progress Notes (Signed)
Patient presents today for Trodelvy infusion per providers order.  Vital signs within parameters for treatment.  Labs pending.  Patient has no new complaints at this time.   ? ?Patient states that he took a Claritin prior to appointment. ? ?Labs within parameters for treatment. ?

## 2021-07-21 NOTE — Patient Instructions (Signed)
[facility]  Discharge Instructions: Thank you for choosing [facility] to provide your oncology and hematology care.  If you have a lab appointment with the Cancer Center, please come in thru the Main Entrance and check in at the main information desk.  Wear comfortable clothing and clothing appropriate for easy access to any Portacath or PICC line.   We strive to give you quality time with your provider. You may need to reschedule your appointment if you arrive late (15 or more minutes).  Arriving late affects you and other patients whose appointments are after yours.  Also, if you miss three or more appointments without notifying the office, you may be dismissed from the clinic at the provider's discretion.      For prescription refill requests, have your pharmacy contact our office and allow 72 hours for refills to be completed.        To help prevent nausea and vomiting after your treatment, we encourage you to take your nausea medication as directed.  BELOW ARE SYMPTOMS THAT SHOULD BE REPORTED IMMEDIATELY: *FEVER GREATER THAN 100.4 F (38 C) OR HIGHER *CHILLS OR SWEATING *NAUSEA AND VOMITING THAT IS NOT CONTROLLED WITH YOUR NAUSEA MEDICATION *UNUSUAL SHORTNESS OF BREATH *UNUSUAL BRUISING OR BLEEDING *URINARY PROBLEMS (pain or burning when urinating, or frequent urination) *BOWEL PROBLEMS (unusual diarrhea, constipation, pain near the anus) TENDERNESS IN MOUTH AND THROAT WITH OR WITHOUT PRESENCE OF ULCERS (sore throat, sores in mouth, or a toothache) UNUSUAL RASH, SWELLING OR PAIN  UNUSUAL VAGINAL DISCHARGE OR ITCHING   Items with * indicate a potential emergency and should be followed up as soon as possible or go to the Emergency Department if any problems should occur.  Please show the CHEMOTHERAPY ALERT CARD or IMMUNOTHERAPY ALERT CARD at check-in to the Emergency Department and triage nurse.  Should you have questions after your visit or need to cancel  or reschedule your appointment, please contact [facility] [phone]  and follow the prompts.  Office hours are 8:00 a.m. to 4:30 p.m. Monday - Friday. Please note that voicemails left after 4:00 p.m. may not be returned until the following business day.  We are closed weekends and major holidays. You have access to a nurse at all times for urgent questions. Please call the main number to the clinic [phone] and follow the prompts.  For any non-urgent questions, you may also contact your provider using MyChart. We now offer e-Visits for anyone 18 and older to request care online for non-urgent symptoms. For details visit [email or web address].   Also download the MyChart app! Go to the app store, search "MyChart", open the app, select , and log in with your MyChart username and password.  Due to Covid, a mask is required upon entering the hospital/clinic. If you do not have a mask, one will be given to you upon arrival. For doctor visits, patients may have 1 support person aged 18 or older with them. For treatment visits, patients cannot have anyone with them due to current Covid guidelines and our immunocompromised population.  

## 2021-07-21 NOTE — Progress Notes (Signed)
Hypersensitivity Reaction note ? ?Date of event: 07/21/21 ?Time of event: 1015 ?Generic name of drug involved: [sacituzumab Govitecan-hziy] Trodevly ?Name of provider notified of the hypersensitivity reaction: Dr. Tomie China ?Was agent that likely caused hypersensitivity reaction added to Allergies List within EMR? yes ?Chain of events including reaction signs/symptoms, treatment administered, and outcome (e.g., drug resumed; drug discontinued; sent to Emergency Department; etc.)  ?Trodevly started at 1008.  Patient called out at 1012.  Patient flushed and can not breath and having chest pressure.  Trodevly stopped at 1015 and saline started at 999 ml/hr.  Dr. Delton Coombes in the room at 1016.  1018 Vital signs 188/94, HR 95, temp 96.1 O2 96%.  1020 Patients color returned to normal.  Patient states that he is feeling better.  Vital signs 158/81, HR 95, O2 100%.  Dr. Delton Coombes tells the patient when he is feeling better he can be discharged and the Ortencia Kick will be discontinued.  1045 Patient states that he feels back to normal and that he is ready to leave and get some fresh air.  Port deaccessed and patient left in stable ambulatory condition. ? ?Marlane Hatcher, RN ?07/21/2021 10:45 AM ? ?

## 2021-07-22 ENCOUNTER — Telehealth (HOSPITAL_COMMUNITY): Payer: Self-pay | Admitting: *Deleted

## 2021-07-22 ENCOUNTER — Other Ambulatory Visit (HOSPITAL_COMMUNITY): Payer: Self-pay | Admitting: *Deleted

## 2021-07-22 MED ORDER — ESCITALOPRAM OXALATE 10 MG PO TABS: 10.0000 mg | ORAL_TABLET | Freq: Every day | ORAL | 1 refills | Status: DC

## 2021-07-22 NOTE — Telephone Encounter (Signed)
Patient's daughter called into the clinic stating pt has been depressed lately and wanted to see if Dr.K could prescribe him something to help. Message sent to Dr.K and stated to send Lexapro 10 mg daily to [facility] in [place]. Called pt's daughter to let her know and she verbalized understanding. ?

## 2021-07-28 ENCOUNTER — Ambulatory Visit (HOSPITAL_BASED_OUTPATIENT_CLINIC_OR_DEPARTMENT_OTHER): Payer: Medicare Other

## 2021-08-02 ENCOUNTER — Ambulatory Visit (HOSPITAL_COMMUNITY)
Admission: RE | Admit: 2021-08-02 | Discharge: 2021-08-02 | Disposition: A | Discharge status: Home / Self Care | Payer: Medicare Other | Source: Ambulatory Visit | Attending: Hematology | Admitting: Hematology

## 2021-08-02 DIAGNOSIS — K7689 Other specified diseases of liver: Secondary | ICD-10-CM | POA: Diagnosis not present

## 2021-08-02 DIAGNOSIS — C7910 Secondary malignant neoplasm of unspecified urinary organs: Secondary | ICD-10-CM | POA: Insufficient documentation

## 2021-08-02 DIAGNOSIS — I700 Atherosclerosis of aorta: Secondary | ICD-10-CM | POA: Insufficient documentation

## 2021-08-02 DIAGNOSIS — C787 Secondary malignant neoplasm of liver and intrahepatic bile duct: Secondary | ICD-10-CM | POA: Insufficient documentation

## 2021-08-02 DIAGNOSIS — N2889 Other specified disorders of kidney and ureter: Secondary | ICD-10-CM | POA: Diagnosis not present

## 2021-08-02 MED ORDER — HEPARIN SOD (PORK) LOCK FLUSH 100 UNIT/ML IV SOLN
INTRAVENOUS | Status: AC
  Administered 2021-08-02: 500 [IU] via INTRAVENOUS
  Filled 2021-08-02: qty 5

## 2021-08-02 MED ORDER — IOHEXOL 300 MG/ML  SOLN
100.0000 mL | Freq: Once | INTRAMUSCULAR | Status: AC | PRN
  Administered 2021-08-02: 100 mL via INTRAVENOUS

## 2021-08-02 MED ORDER — HEPARIN SOD (PORK) LOCK FLUSH 100 UNIT/ML IV SOLN: 500.0000 [IU] | Freq: Once | INTRAVENOUS | Status: AC

## 2021-08-03 ENCOUNTER — Other Ambulatory Visit: Payer: Self-pay | Admitting: *Deleted

## 2021-08-03 ENCOUNTER — Encounter: Payer: Self-pay | Admitting: Internal Medicine

## 2021-08-03 DIAGNOSIS — N529 Male erectile dysfunction, unspecified: Secondary | ICD-10-CM

## 2021-08-03 MED ORDER — TADALAFIL 10 MG PO TABS: 20.0000 mg | ORAL_TABLET | Freq: Every day | ORAL | 3 refills | Status: DC | PRN

## 2021-08-04 ENCOUNTER — Inpatient Hospital Stay (HOSPITAL_COMMUNITY): Payer: Medicare Other

## 2021-08-04 ENCOUNTER — Inpatient Hospital Stay (HOSPITAL_COMMUNITY): Payer: Medicare Other | Attending: Hematology | Admitting: Hematology

## 2021-08-04 DIAGNOSIS — I129 Hypertensive chronic kidney disease with stage 1 through stage 4 chronic kidney disease, or unspecified chronic kidney disease: Secondary | ICD-10-CM | POA: Diagnosis not present

## 2021-08-04 DIAGNOSIS — C679 Malignant neoplasm of bladder, unspecified: Secondary | ICD-10-CM | POA: Diagnosis not present

## 2021-08-04 DIAGNOSIS — K5900 Constipation, unspecified: Secondary | ICD-10-CM | POA: Insufficient documentation

## 2021-08-04 DIAGNOSIS — C7902 Secondary malignant neoplasm of left kidney and renal pelvis: Secondary | ICD-10-CM | POA: Insufficient documentation

## 2021-08-04 DIAGNOSIS — N189 Chronic kidney disease, unspecified: Secondary | ICD-10-CM | POA: Diagnosis not present

## 2021-08-04 DIAGNOSIS — G629 Polyneuropathy, unspecified: Secondary | ICD-10-CM | POA: Diagnosis not present

## 2021-08-04 DIAGNOSIS — C7910 Secondary malignant neoplasm of unspecified urinary organs: Secondary | ICD-10-CM

## 2021-08-04 DIAGNOSIS — R1012 Left upper quadrant pain: Secondary | ICD-10-CM | POA: Diagnosis not present

## 2021-08-04 DIAGNOSIS — F419 Anxiety disorder, unspecified: Secondary | ICD-10-CM | POA: Diagnosis not present

## 2021-08-04 DIAGNOSIS — F17200 Nicotine dependence, unspecified, uncomplicated: Secondary | ICD-10-CM | POA: Insufficient documentation

## 2021-08-04 DIAGNOSIS — C787 Secondary malignant neoplasm of liver and intrahepatic bile duct: Secondary | ICD-10-CM | POA: Insufficient documentation

## 2021-08-04 LAB — COMPREHENSIVE METABOLIC PANEL
ALT: 27 U/L (ref 0–44)
AST: 24 U/L (ref 15–41)
Albumin: 3.3 g/dL — ABNORMAL LOW (ref 3.5–5.0)
Alkaline Phosphatase: 94 U/L (ref 38–126)
Anion gap: 7 (ref 5–15)
BUN: 11 mg/dL (ref 8–23)
CO2: 23 mmol/L (ref 22–32)
Calcium: 8.8 mg/dL — ABNORMAL LOW (ref 8.9–10.3)
Chloride: 107 mmol/L (ref 98–111)
Creatinine, Ser: 0.82 mg/dL (ref 0.61–1.24)
GFR, Estimated: >60 mL/min (ref >60)
Glucose, Bld: 127 mg/dL — ABNORMAL HIGH (ref 70–99)
Potassium: 4 mmol/L (ref 3.5–5.1)
Sodium: 137 mmol/L (ref 135–145)
Total Bilirubin: 0.6 mg/dL (ref 0.3–1.2)
Total Protein: 6.7 g/dL (ref 6.5–8.1)

## 2021-08-04 LAB — CBC WITH DIFFERENTIAL/PLATELET
Abs Immature Granulocytes: 0.02 10*3/uL (ref 0.00–0.07)
Basophils Absolute: 0 10*3/uL (ref 0.0–0.1)
Basophils Relative: 1 %
Eosinophils Absolute: 0.1 10*3/uL (ref 0.0–0.5)
Eosinophils Relative: 1 %
HCT: 36.4 % — ABNORMAL LOW (ref 39.0–52.0)
Hemoglobin: 12.2 g/dL — ABNORMAL LOW (ref 13.0–17.0)
Immature Granulocytes: 0 %
Lymphocytes Relative: 14 %
Lymphs Abs: 0.9 10*3/uL (ref 0.7–4.0)
MCH: 30.6 pg (ref 26.0–34.0)
MCHC: 33.5 g/dL (ref 30.0–36.0)
MCV: 91.2 fL (ref 80.0–100.0)
Monocytes Absolute: 0.9 10*3/uL (ref 0.1–1.0)
Monocytes Relative: 15 %
Neutro Abs: 4.1 10*3/uL (ref 1.7–7.7)
Neutrophils Relative %: 69 %
Platelets: 231 10*3/uL (ref 150–400)
RBC: 3.99 MIL/uL — ABNORMAL LOW (ref 4.22–5.81)
RDW: 15 % (ref 11.5–15.5)
WBC: 5.9 10*3/uL (ref 4.0–10.5)
nRBC: 0 % (ref 0.0–0.2)

## 2021-08-04 LAB — MAGNESIUM: Magnesium: 2 mg/dL (ref 1.7–2.4)

## 2021-08-04 LAB — PHOSPHORUS: Phosphorus: 3 mg/dL (ref 2.5–4.6)

## 2021-08-04 MED ORDER — SODIUM CHLORIDE 0.9% FLUSH
10.0000 mL | INTRAVENOUS | Status: AC | PRN
  Administered 2021-08-04: 10 mL via INTRAVENOUS

## 2021-08-04 MED ORDER — HEPARIN SOD (PORK) LOCK FLUSH 100 UNIT/ML IV SOLN
500.0000 [IU] | Freq: Once | INTRAVENOUS | Status: AC
  Administered 2021-08-04: 500 [IU] via INTRAVENOUS

## 2021-08-04 NOTE — Addendum Note (Signed)
Addended by: Terrace Arabia on: 08/04/2021 10:31 AM ? ? Modules accepted: Orders ? ?

## 2021-08-04 NOTE — Progress Notes (Signed)
No treatment today per MD.  

## 2021-08-04 NOTE — Progress Notes (Signed)
? ?[name] ?[address] ?[place], [place] [address] ? ? ?CLINIC:  ?Medical Oncology/Hematology ? ?PCP:  ?Lindell Spar, MD ?[address] / [place] [address] ?[phone] ? ? ?REASON FOR VISIT:  ?Follow-up for metastatic urothelial carcinoma ? ?PRIOR THERAPY:  ?Keytruda every 3 weeks ?Enfortumab every 4 weeks ? ? ?NGS Results: not done ? ?CURRENT THERAPY: Sacituzumab govitecan-hziy [name] ? ?BRIEF ONCOLOGIC HISTORY:  ?Oncology History  ?Metastatic urothelial carcinoma ([place])  ?08/13/2020 Initial Diagnosis  ? Metastatic urothelial carcinoma ([place]) ? ?  ?08/13/2020 Cancer Staging  ? Staging form: Urinary Bladder, AJCC 8th Edition ?- Clinical stage from 08/13/2020: Stage IVB (cTX, cN0, pM1b) - Signed by Derek Jack, MD on 08/13/2020 ?Stage prefix: Initial diagnosis ?WHO/ISUP grade (low/high): High Grade ?Histologic grading system: 2 grade system ? ?  ?08/20/2020 - 10/22/2020 Chemotherapy  ?  ? ?  ? ?  ?11/19/2020 - 01/28/2021 Chemotherapy  ? Patient is on Treatment Plan : UROTHELIAL LOCALLY ADVANCED / METASTATIC Enfortumab q28d  ? ?  ?  ?02/15/2021 -  Chemotherapy  ? Patient is on Treatment Plan : BLADDER Sacituzumab govitecan-hziy Ivette Loyal) q21d  ? ?  ?  ? ? ?CANCER STAGING: ? Cancer Staging  ?Metastatic urothelial carcinoma ([place]) ?Staging form: Urinary Bladder, AJCC 8th Edition ?- Clinical stage from 08/13/2020: Stage IVB (cTX, cN0, pM1b) - Signed by Derek Jack, MD on 08/13/2020 ? ? ?INTERVAL HISTORY:  ?Jeremy Rodriguez, a 71 y.o. male, returns for routine follow-up and consideration for next cycle of chemotherapy. [name] was last seen on 07/14/2021. ? ?Due for cycle #9 of Trodelvy today.  ? ?Overall, he tells me he has been feeling pretty well. His abdominal pain was increased for a couple of days following his last treatment, but he reports his abdominal pain has now returned to baseline. He is taking 1-2 oxycodone tablets daily. He denies worsening pain  upon deep inspiration. He reports numbness in his fingers and toes.  ? ?Overall, he will not receive his next cycle of chemo today.  ? ?REVIEW OF SYSTEMS:  ?Review of Systems  ?Constitutional:  Negative for appetite change and fatigue.  ?Gastrointestinal:  Positive for abdominal pain.  ?Neurological:  Positive for numbness (fingers and toes).  ?Psychiatric/Behavioral:  Positive for sleep disturbance.   ?All other systems reviewed and are negative. ? ?PAST MEDICAL/SURGICAL HISTORY:  ?Past Medical History:  ?Diagnosis Date  ? Chronic kidney disease   ? GERD (gastroesophageal reflux disease)   ? Hypertension   ? Medical history non-contributory   ? ?Past Surgical History:  ?Procedure Laterality Date  ? appendectomy    ? PORTACATH PLACEMENT Right 05/26/2021  ? Procedure: INSERTION PORT-A-CATH- IJ;  Surgeon: Rusty Aus, DO;  Location: [facility];  Service: General;  Laterality: Right;  Internal Jugular   ? ? ?SOCIAL HISTORY:  ?Social History  ? ?Socioeconomic History  ? Marital status: Single  ?  Spouse name: Not on file  ? Number of children: Not on file  ? Years of education: Not on file  ? Highest education level: Not on file  ?Occupational History  ? Not on file  ?Tobacco Use  ? Smoking status: Some Days  ? Smokeless tobacco: Never  ? Tobacco comments:  ?  a few cigarettes a day  ?Substance and Sexual Activity  ? Alcohol use: Yes  ?  Comment: occ  ? Drug use: Never  ? Sexual activity: Not on file  ?Other Topics Concern  ? Not  on file  ?Social History Narrative  ? Not on file  ? ?Social Determinants of Health  ? ?Financial Resource Strain: Low Risk   ? Difficulty of Paying Living Expenses: Not hard at all  ?Food Insecurity: No Food Insecurity  ? Worried About Charity fundraiser in the Last Year: Never true  ? Ran Out of Food in the Last Year: Never true  ?Transportation Needs: No Transportation Needs  ? Lack of Transportation (Medical): No  ? Lack of Transportation (Non-Medical): No  ?Physical Activity:  Unknown  ? Days of Exercise per Week: 5 days  ? Minutes of Exercise per Session: Not on file  ?Stress: No Stress Concern Present  ? Feeling of Stress : Not at all  ?Social Connections: Moderately Isolated  ? Frequency of Communication with Friends and Family: More than three times a week  ? Frequency of Social Gatherings with Friends and Family: More than three times a week  ? Attends Religious Services: More than 4 times per year  ? Active Member of Clubs or Organizations: No  ? Attends Archivist Meetings: Never  ? Marital Status: Divorced  ?Intimate Partner Violence: Not At Risk  ? Fear of Current or Ex-Partner: No  ? Emotionally Abused: No  ? Physically Abused: No  ? Sexually Abused: No  ? ? ?FAMILY HISTORY:  ?Family History  ?Problem Relation Age of Onset  ? Colon cancer Neg Hx   ? Colon polyps Neg Hx   ? Liver cancer Neg Hx   ? Pancreatic cancer Neg Hx   ? ? ?CURRENT MEDICATIONS:  ?Current Outpatient Medications  ?Medication Sig Dispense Refill  ? ALPRAZolam (XANAX) 1 MG tablet Take 1 tablet (1 mg total) by mouth 2 (two) times daily. 60 tablet 3  ? dicyclomine (BENTYL) 10 MG capsule Take 1 capsule (10 mg total) by mouth 3 (three) times daily as needed for spasms. (Patient taking differently: Take 10 mg by mouth 3 (three) times daily before meals.) 90 capsule 6  ? docusate sodium (COLACE) 100 MG capsule Take 1 capsule (100 mg total) by mouth daily as needed for mild constipation. 30 capsule 0  ? escitalopram (LEXAPRO) 10 MG tablet Take 1 tablet (10 mg total) by mouth daily. 30 tablet 1  ? ibuprofen (ADVIL) 800 MG tablet Take 1 tablet (800 mg total) by mouth 2 (two) times daily as needed. 60 tablet 5  ? Lactulose 20 GM/30ML SOLN Take 30 mLs (20 g total) by mouth daily. (Patient taking differently: Take 30 mLs by mouth daily as needed (Constipation).) 473 mL 6  ? lidocaine-prilocaine (EMLA) cream Apply 1 application. topically as needed. 30 g 0  ? linaclotide (LINZESS) 145 MCG CAPS capsule Take 1  capsule (145 mcg total) by mouth daily before breakfast. 30 capsule 2  ? megestrol (MEGACE) 400 MG/10ML suspension Take 10 mLs (400 mg total) by mouth daily. 480 mL 3  ? Oxycodone HCl 20 MG TABS Take 0.75 tablets (15 mg total) by mouth every 8 (eight) hours as needed. 84 tablet 0  ? pantoprazole (PROTONIX) 40 MG tablet Take 1 tablet (40 mg total) by mouth daily. 30 tablet 6  ? prochlorperazine (COMPAZINE) 10 MG tablet Take 1 tablet (10 mg total) by mouth every 6 (six) hours as needed for nausea or vomiting. 30 tablet 3  ? sennosides-docusate sodium (SENOKOT-S) 8.6-50 MG tablet Take 1 tablet by mouth daily.    ? tadalafil (CIALIS) 10 MG tablet Take 2 tablets (20 mg total) by mouth  daily as needed for erectile dysfunction. 30 tablet 3  ? zolpidem (AMBIEN) 10 MG tablet Take 1 tablet (10 mg total) by mouth at bedtime as needed for sleep. 30 tablet 2  ? ?No current facility-administered medications for this visit.  ? ?Facility-Administered Medications Ordered in Other Visits  ?Medication Dose Route Frequency Provider Last Rate Last Admin  ? acetaminophen (TYLENOL) 325 MG tablet           ? famotidine (PEPCID) 20-0.9 MG/50ML-% IVPB           ? palonosetron (ALOXI) 0.25 MG/5ML injection           ? ? ?ALLERGIES:  ?Allergies  ?Allergen Reactions  ? Ivette Loyal [Sacituzumab Govitecan-Hziy] Other (See Comments)  ?  See notes from 07/14/21 and 07/21/21 ?  ?   ? ? ?PHYSICAL EXAM:  ?Performance status (ECOG): 1 - Symptomatic but completely ambulatory ? ?There were no vitals filed for this visit. ?Wt Readings from Last 3 Encounters:  ?08/04/21 152 lb 12.8 oz (69.3 kg)  ?07/21/21 151 lb 12.8 oz (68.9 kg)  ?07/14/21 154 lb 3.2 oz (69.9 kg)  ? ?Physical Exam ?Vitals reviewed.  ?Constitutional:   ?   Appearance: Normal appearance.  ?Cardiovascular:  ?   Rate and Rhythm: Normal rate and regular rhythm.  ?   Pulses: Normal pulses.  ?   Heart sounds: Normal heart sounds.  ?Pulmonary:  ?   Effort: Pulmonary effort is normal.  ?   Breath  sounds: Normal breath sounds.  ?Neurological:  ?   General: No focal deficit present.  ?   Mental Status: He is alert and oriented to person, place, and time.  ?Psychiatric:     ?   Mood and Affect: Mood normal.     ?

## 2021-08-04 NOTE — Patient Instructions (Addendum)
[place] at [facility] ?Discharge Instructions ? ? ?You were seen and examined today by Dr. Delton Coombes. ? ?He discussed lack of treatment options due to allergic reaction to the previous medication. It was helping to control your cancer (as evidenced by your recent CT scan), but you are not able to tolerate it.  ? ?Chemotherapy treatment is an option. Dr. Raliegh Ip will also reach out to [facility] also to see what clinical trials are available. You will hear from them about an appointment.  ? ?Reach out to [place] if you decide you would like to pursue chemotherapy as a treatment option.  ? ?Return as scheduled.  ? ? ?Thank you for choosing [place] at [facility] to provide your oncology and hematology care.  To afford each patient quality time with our provider, please arrive at least 15 minutes before your scheduled appointment time.  ? ?If you have a lab appointment with the [facility] please come in thru the Main Entrance and check in at the main information desk. ? ?You need to re-schedule your appointment should you arrive 10 or more minutes late.  We strive to give you quality time with our providers, and arriving late affects you and other patients whose appointments are after yours.  Also, if you no show three or more times for appointments you may be dismissed from the clinic at the providers discretion.     ?Again, thank you for choosing [facility].  Our hope is that these requests will decrease the amount of time that you wait before being seen by our physicians.       ?_____________________________________________________________ ? ?Should you have questions after your visit to [facility], please contact our office at [phone] and follow the prompts.  Our office hours are 8:00 a.m. and 4:30 p.m. Monday - Friday.  Please note that voicemails left after 4:00 p.m. may not be returned until the following business day.  We are  closed weekends and major holidays.  You do have access to a nurse 24-7, just call the main number to the clinic [phone] and do not press any options, hold on the line and a nurse will answer the phone.   ? ?For prescription refill requests, have your pharmacy contact our office and allow 72 hours.   ? ?Due to Covid, you will need to wear a mask upon entering the hospital. If you do not have a mask, a mask will be given to you at the Main Entrance upon arrival. For doctor visits, patients may have 1 support person age 71 or older with them. For treatment visits, patients can not have anyone with them due to social distancing guidelines and our immunocompromised population.  ? ?   ?

## 2021-08-06 ENCOUNTER — Encounter (HOSPITAL_COMMUNITY): Payer: Self-pay

## 2021-08-06 NOTE — Progress Notes (Signed)
Messaged received from Dr. Delton Coombes to refer patient to Dr. Silva Bandy at [facility] for clinical trial evaluation. Patient aware of referral and verbalized understanding ?

## 2021-08-11 ENCOUNTER — Ambulatory Visit (HOSPITAL_COMMUNITY): Payer: Medicare Other

## 2021-08-11 ENCOUNTER — Other Ambulatory Visit (HOSPITAL_COMMUNITY): Payer: Medicare Other

## 2021-08-18 ENCOUNTER — Other Ambulatory Visit (HOSPITAL_COMMUNITY): Payer: Self-pay

## 2021-08-18 MED ORDER — OXYCODONE HCL 20 MG PO TABS: 15.0000 mg | ORAL_TABLET | Freq: Three times a day (TID) | ORAL | 0 refills | Status: DC | PRN

## 2021-08-20 ENCOUNTER — Ambulatory Visit (HOSPITAL_COMMUNITY): Payer: Medicare Other

## 2021-09-01 ENCOUNTER — Inpatient Hospital Stay (HOSPITAL_COMMUNITY): Payer: Medicare Other | Admitting: Hematology

## 2021-09-10 ENCOUNTER — Other Ambulatory Visit: Payer: Self-pay | Admitting: Internal Medicine

## 2021-09-10 DIAGNOSIS — C7910 Secondary malignant neoplasm of unspecified urinary organs: Secondary | ICD-10-CM | POA: Diagnosis not present

## 2021-09-14 ENCOUNTER — Other Ambulatory Visit (HOSPITAL_COMMUNITY): Payer: Self-pay | Admitting: *Deleted

## 2021-09-14 MED ORDER — OXYCODONE HCL 20 MG PO TABS: 15.0000 mg | ORAL_TABLET | Freq: Three times a day (TID) | ORAL | 0 refills | Status: DC | PRN

## 2021-09-15 ENCOUNTER — Inpatient Hospital Stay (HOSPITAL_COMMUNITY): Payer: Medicare Other | Attending: Hematology | Admitting: Hematology

## 2021-09-15 ENCOUNTER — Encounter (HOSPITAL_COMMUNITY): Payer: Self-pay

## 2021-09-15 VITALS — BP 115/73 | HR 98 | Temp 97.7°F | Resp 18 | Ht 65.16 in | Wt 145.9 lb

## 2021-09-15 DIAGNOSIS — F419 Anxiety disorder, unspecified: Secondary | ICD-10-CM | POA: Insufficient documentation

## 2021-09-15 DIAGNOSIS — G479 Sleep disorder, unspecified: Secondary | ICD-10-CM | POA: Insufficient documentation

## 2021-09-15 DIAGNOSIS — F17210 Nicotine dependence, cigarettes, uncomplicated: Secondary | ICD-10-CM | POA: Diagnosis not present

## 2021-09-15 DIAGNOSIS — C679 Malignant neoplasm of bladder, unspecified: Secondary | ICD-10-CM | POA: Diagnosis not present

## 2021-09-15 DIAGNOSIS — N2889 Other specified disorders of kidney and ureter: Secondary | ICD-10-CM | POA: Diagnosis not present

## 2021-09-15 DIAGNOSIS — C7910 Secondary malignant neoplasm of unspecified urinary organs: Secondary | ICD-10-CM

## 2021-09-15 DIAGNOSIS — G629 Polyneuropathy, unspecified: Secondary | ICD-10-CM | POA: Insufficient documentation

## 2021-09-15 DIAGNOSIS — N189 Chronic kidney disease, unspecified: Secondary | ICD-10-CM | POA: Diagnosis not present

## 2021-09-15 DIAGNOSIS — Z5111 Encounter for antineoplastic chemotherapy: Secondary | ICD-10-CM | POA: Insufficient documentation

## 2021-09-15 DIAGNOSIS — I129 Hypertensive chronic kidney disease with stage 1 through stage 4 chronic kidney disease, or unspecified chronic kidney disease: Secondary | ICD-10-CM | POA: Diagnosis not present

## 2021-09-15 DIAGNOSIS — K5900 Constipation, unspecified: Secondary | ICD-10-CM | POA: Diagnosis not present

## 2021-09-15 DIAGNOSIS — R1012 Left upper quadrant pain: Secondary | ICD-10-CM | POA: Diagnosis not present

## 2021-09-15 DIAGNOSIS — C787 Secondary malignant neoplasm of liver and intrahepatic bile duct: Secondary | ICD-10-CM | POA: Insufficient documentation

## 2021-09-15 MED ORDER — TRAZODONE HCL 100 MG PO TABS: 100.0000 mg | ORAL_TABLET | Freq: Every day | ORAL | 2 refills | Status: DC

## 2021-09-15 MED ORDER — ESCITALOPRAM OXALATE 10 MG PO TABS: 10.0000 mg | ORAL_TABLET | Freq: Every day | ORAL | 1 refills | Status: DC

## 2021-09-15 NOTE — Progress Notes (Signed)
DISCONTINUE ON PATHWAY REGIMEN - Bladder     A cycle is every 21 days:     Sacituzumab govitecan-hziy   **Always confirm dose/schedule in your pharmacy ordering system**  REASON: Toxicities / Adverse Event PRIOR TREATMENT: [id number]: Sacituzumab Govitecan 10 mg/kg D1, 8 q21 Days Until Disease Progression or Unacceptable Toxicity TREATMENT RESPONSE: Partial Response (PR)  START OFF PATHWAY REGIMEN - Bladder   [id number]:Carboplatin AUC=4.5 Day 1 + Gemcitabine 1,000 mg/m2 Days 1, 8 q21 Days:   A cycle is every 21 days:     Gemcitabine      Carboplatin   **Always confirm dose/schedule in your pharmacy ordering system**  Patient Characteristics: Advanced/Metastatic Disease, Third Line and Beyond Therapeutic Status: Advanced/Metastatic Disease Line of Therapy: Third Line and Beyond  Intent of Therapy: Non-Curative / Palliative Intent, Discussed with Patient

## 2021-09-15 NOTE — Patient Instructions (Addendum)
[place] at [facility] Discharge Instructions   You were seen and examined today by Dr. Delton Coombes.  He discussed treatment with you with 2 drugs called carboplatin and gemcitabine. Treatment is 2 weeks in a row with one week off.   We will repeat a CT scan prior to initiating treatment.   Return as scheduled after CT.    Thank you for choosing [place] at [facility] to provide your oncology and hematology care.  To afford each patient quality time with our provider, please arrive at least 15 minutes before your scheduled appointment time.   If you have a lab appointment with the [facility] please come in thru the Main Entrance and check in at the main information desk.  You need to re-schedule your appointment should you arrive 10 or more minutes late.  We strive to give you quality time with our providers, and arriving late affects you and other patients whose appointments are after yours.  Also, if you no show three or more times for appointments you may be dismissed from the clinic at the providers discretion.     Again, thank you for choosing [facility].  Our hope is that these requests will decrease the amount of time that you wait before being seen by our physicians.       _____________________________________________________________  Should you have questions after your visit to [facility], please contact our office at [phone] and follow the prompts.  Our office hours are 8:00 a.m. and 4:30 p.m. Monday - Friday.  Please note that voicemails left after 4:00 p.m. may not be returned until the following business day.  We are closed weekends and major holidays.  You do have access to a nurse 24-7, just call the main number to the clinic [phone] and do not press any options, hold on the line and a nurse will answer the phone.    For prescription refill requests, have your pharmacy contact our  office and allow 72 hours.    Due to Covid, you will need to wear a mask upon entering the hospital. If you do not have a mask, a mask will be given to you at the Main Entrance upon arrival. For doctor visits, patients may have 1 support person age 7 or older with them. For treatment visits, patients can not have anyone with them due to social distancing guidelines and our immunocompromised population.

## 2021-09-15 NOTE — Progress Notes (Signed)
[facility] [address], [place] [address]   CLINIC:  Medical Oncology/Hematology  PCP:  Lindell Spar, MD [address] / [place] [address] [phone]   REASON FOR VISIT:  Follow-up for metastatic urothelial carcinoma  PRIOR THERAPY:  Keytruda every 3 weeks Enfortumab every 4 weeks Sacituzumab govitecan-hziy [name]  NGS Results: not done  CURRENT THERAPY: under work-up  BRIEF ONCOLOGIC HISTORY:  Oncology History  Metastatic urothelial carcinoma ([place])  08/13/2020 Initial Diagnosis   Metastatic urothelial carcinoma ([place])   08/13/2020 Cancer Staging   Staging form: Urinary Bladder, AJCC 8th Edition - Clinical stage from 08/13/2020: Stage IVB (cTX, cN0, pM1b) - Signed by Derek Jack, MD on 08/13/2020 Stage prefix: Initial diagnosis WHO/ISUP grade (low/high): High Grade Histologic grading system: 2 grade system   08/20/2020 - 10/22/2020 Chemotherapy         11/19/2020 - 01/28/2021 Chemotherapy   Patient is on Treatment Plan : UROTHELIAL LOCALLY ADVANCED / METASTATIC Enfortumab q28d     02/15/2021 -  Chemotherapy   Patient is on Treatment Plan : BLADDER Sacituzumab govitecan-hziy Ivette Loyal) q21d       CANCER STAGING: Cancer Staging  Metastatic urothelial carcinoma ([place]) Staging form: Urinary Bladder, AJCC 8th Edition - Clinical stage from 08/13/2020: Stage IVB (cTX, cN0, pM1b) - Signed by Derek Jack, MD on 08/13/2020   INTERVAL HISTORY:  [name], a 71 y.o. male, returns for routine follow-up of his metastatic urothelial carcinoma. Jeremy Rodriguez was last seen on 08/04/2021.   Today he reports feeling good. He reports left sided abdominal pain for which he is taking 3 oxycodone tablets daily, and this pain is no longer helped by ibuprofen. He reports difficulty sleeping which is not helped by Ambien.   REVIEW OF SYSTEMS:  Review of Systems  Constitutional:  Negative for appetite change  and fatigue.  Cardiovascular:  Positive for chest pain.  Gastrointestinal:  Positive for abdominal pain (6/10 L side).  Neurological:  Positive for numbness.  Psychiatric/Behavioral:  Positive for depression and sleep disturbance. The patient is nervous/anxious.   All other systems reviewed and are negative.   PAST MEDICAL/SURGICAL HISTORY:  Past Medical History:  Diagnosis Date   Chronic kidney disease    GERD (gastroesophageal reflux disease)    Hypertension    Medical history non-contributory    Past Surgical History:  Procedure Laterality Date   appendectomy     PORTACATH PLACEMENT Right 05/26/2021   Procedure: INSERTION PORT-A-CATH- IJ;  Surgeon: Rusty Aus, DO;  Location: [facility];  Service: General;  Laterality: Right;  Internal Jugular     SOCIAL HISTORY:  Social History   Socioeconomic History   Marital status: Single    Spouse name: Not on file   Number of children: Not on file   Years of education: Not on file   Highest education level: Not on file  Occupational History   Not on file  Tobacco Use   Smoking status: Some Days   Smokeless tobacco: Never   Tobacco comments:    a few cigarettes a day  Substance and Sexual Activity   Alcohol use: Yes    Comment: occ   Drug use: Never   Sexual activity: Not on file  Other Topics Concern   Not on file  Social History Narrative   Not on file   Social Determinants of Health   Financial Resource Strain: Low Risk  (11/09/2020)   Overall Financial Resource Strain (CARDIA)  Difficulty of Paying Living Expenses: Not hard at all  Food Insecurity: No Food Insecurity (11/09/2020)   Hunger Vital Sign    Worried About Running Out of Food in the Last Year: Never true    Ran Out of Food in the Last Year: Never true  Transportation Needs: No Transportation Needs (11/09/2020)   PRAPARE - Hydrologist (Medical): No    Lack of Transportation (Non-Medical): No  Physical Activity: Unknown  (11/09/2020)   Exercise Vital Sign    Days of Exercise per Week: 5 days    Minutes of Exercise per Session: Not on file  Stress: No Stress Concern Present (11/09/2020)   [place]    Feeling of Stress : Not at all  Social Connections: Moderately Isolated (11/09/2020)   Social Connection and Isolation Panel [NHANES]    Frequency of Communication with Friends and Family: More than three times a week    Frequency of Social Gatherings with Friends and Family: More than three times a week    Attends Religious Services: More than 4 times per year    Active Member of [employer] or Organizations: No    Attends Archivist Meetings: Never    Marital Status: Divorced  Human resources officer Violence: Not At Risk (11/09/2020)   Humiliation, Afraid, Rape, and Kick questionnaire    Fear of Current or Ex-Partner: No    Emotionally Abused: No    Physically Abused: No    Sexually Abused: No    FAMILY HISTORY:  Family History  Problem Relation Age of Onset   Colon cancer Neg Hx    Colon polyps Neg Hx    Liver cancer Neg Hx    Pancreatic cancer Neg Hx     CURRENT MEDICATIONS:  Current Outpatient Medications  Medication Sig Dispense Refill   ALPRAZolam (XANAX) 1 MG tablet Take 1 tablet (1 mg total) by mouth 2 (two) times daily. 60 tablet 3   dicyclomine (BENTYL) 10 MG capsule Take 1 capsule (10 mg total) by mouth 3 (three) times daily as needed for spasms. (Patient taking differently: Take 10 mg by mouth 3 (three) times daily before meals.) 90 capsule 6   docusate sodium (COLACE) 100 MG capsule Take 1 capsule (100 mg total) by mouth daily as needed for mild constipation. 30 capsule 0   escitalopram (LEXAPRO) 10 MG tablet Take 1 tablet (10 mg total) by mouth daily. 30 tablet 1   ibuprofen (ADVIL) 800 MG tablet Take 1 tablet (800 mg total) by mouth 2 (two) times daily as needed. 60 tablet 5   Lactulose 20 GM/30ML SOLN Take 30 mLs (20 g  total) by mouth daily. (Patient taking differently: Take 30 mLs by mouth daily as needed (Constipation).) 473 mL 6   lidocaine-prilocaine (EMLA) cream Apply 1 application. topically as needed. 30 g 0   linaclotide (LINZESS) 145 MCG CAPS capsule Take 1 capsule (145 mcg total) by mouth daily before breakfast. 30 capsule 2   megestrol (MEGACE) 400 MG/10ML suspension Take 10 mLs (400 mg total) by mouth daily. 480 mL 3   Oxycodone HCl 20 MG TABS Take 0.75 tablets (15 mg total) by mouth every 8 (eight) hours as needed. 84 tablet 0   pantoprazole (PROTONIX) 40 MG tablet Take 1 tablet (40 mg total) by mouth daily. 30 tablet 6   prochlorperazine (COMPAZINE) 10 MG tablet Take 1 tablet (10 mg total) by mouth every 6 (six) hours as needed  for nausea or vomiting. 30 tablet 3   sennosides-docusate sodium (SENOKOT-S) 8.6-50 MG tablet Take 1 tablet by mouth daily.     tadalafil (CIALIS) 10 MG tablet Take 2 tablets (20 mg total) by mouth daily as needed for erectile dysfunction. 30 tablet 3   telmisartan (MICARDIS) 20 MG tablet Take 1 tablet by mouth once daily 90 tablet 0   zolpidem (AMBIEN) 10 MG tablet Take 1 tablet (10 mg total) by mouth at bedtime as needed for sleep. 30 tablet 2   No current facility-administered medications for this visit.   Facility-Administered Medications Ordered in Other Visits  Medication Dose Route Frequency Provider Last Rate Last Admin   acetaminophen (TYLENOL) 325 MG tablet            famotidine (PEPCID) 20-0.9 MG/50ML-% IVPB            palonosetron (ALOXI) 0.25 MG/5ML injection            sodium chloride flush (NS) 0.9 % injection 10 mL  10 mL Intravenous PRN Derek Jack, MD   10 mL at 08/04/21 1030    ALLERGIES:  Allergies  Allergen Reactions   Ivette Loyal [Sacituzumab Govitecan-Hziy] Other (See Comments)    See notes from 07/14/21 and 07/21/21        PHYSICAL EXAM:  Performance status (ECOG): 1 - Symptomatic but completely ambulatory  Vitals:   09/15/21 1113   BP: 115/73  Pulse: 98  Resp: 18  Temp: 97.7 F (36.5 C)  SpO2: 100%   Wt Readings from Last 3 Encounters:  09/15/21 145 lb 15.1 oz (66.2 kg)  08/04/21 152 lb 12.8 oz (69.3 kg)  07/21/21 151 lb 12.8 oz (68.9 kg)   Physical Exam Vitals reviewed.  Constitutional:      Appearance: Normal appearance.  Cardiovascular:     Rate and Rhythm: Normal rate and regular rhythm.     Pulses: Normal pulses.     Heart sounds: Normal heart sounds.  Pulmonary:     Effort: Pulmonary effort is normal.     Breath sounds: Normal breath sounds.  Neurological:     General: No focal deficit present.     Mental Status: He is alert and oriented to person, place, and time.  Psychiatric:        Mood and Affect: Mood normal.        Behavior: Behavior normal.      LABORATORY DATA:  I have reviewed the labs as listed.     Latest Ref Rng & Units 08/04/2021    9:32 AM 07/21/2021    8:10 AM 07/14/2021    9:15 AM  CBC  WBC 4.0 - 10.5 K/uL 5.9  6.3  3.5   Hemoglobin 13.0 - 17.0 g/dL 12.2  12.7  11.7   Hematocrit 39.0 - 52.0 % 36.4  37.2  34.2   Platelets 150 - 400 K/uL 231  215  217       Latest Ref Rng & Units 08/04/2021    9:32 AM 07/21/2021    8:10 AM 07/14/2021    9:15 AM  CMP  Glucose 70 - 99 mg/dL 127  159  110   BUN 8 - 23 mg/dL '11  14  13   ' Creatinine 0.61 - 1.24 mg/dL 0.82  1.01  0.95   Sodium 135 - 145 mmol/L 137  137  137   Potassium 3.5 - 5.1 mmol/L 4.0  3.8  3.9   Chloride 98 - 111 mmol/L 107  108  111   CO2 22 - 32 mmol/L '23  21  21   ' Calcium 8.9 - 10.3 mg/dL 8.8  8.9  9.0   Total Protein 6.5 - 8.1 g/dL 6.7  6.8  6.6   Total Bilirubin 0.3 - 1.2 mg/dL 0.6  0.6  0.5   Alkaline Phos 38 - 126 U/L 94  102  93   AST 15 - 41 U/L 24  32  26   ALT 0 - 44 U/L 27  35  28     DIAGNOSTIC IMAGING:  I have independently reviewed the scans and discussed with the patient. No results found.   ASSESSMENT:  1. 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 - 20 pound weight loss in the last 4 months due to decreased appetite.  Left upper quadrant pain for the past 2 to 3 months. - CTAP with contrast on 07/10/2020 showed ill-defined low-density mass in the right hepatic lobe measuring 2 x 2 cm.  Left upper pole kidney mass measuring 7.6 x 5.9 cm with encasement and narrowing of the left renal artery and vein.  No intravascular thrombus noted.  Mass encases the left adrenal gland.  Small right adrenal nodule measuring 11 mm, likely benign.  No other evidence of metastatic disease. - MRI of the liver with and without contrast on 07/20/2020 shows 2.2 x 1.7 cm enhancing lesion in the medial aspect of the segment 7, suspicious for metastasis.  Left renal mass, 6 x 7.6 x 6.9 cm infiltrating enhancing left upper pole renal mass, extending into the left renal sinus.  Possible TCC or lymphoma rather than renal cell carcinoma. - CT chest on 07/20/2020 with no evidence of metastatic disease. - PET scan on 08/04/2020 showed large hypermetabolic left upper pole mass and 2 hypermetabolic lesions in the right hepatic lobe favoring metastatic disease.  No hypermetabolic adenopathy.  - Liver biopsy showed poorly differentiated metastatic urothelial carcinoma.  IHC positive for CK5/6, CK7, GATA3, p40 and patchy positivity with p63 and PAX8. - He refused any chemotherapy. - 4 cycles of pembrolizumab from 08/20/2020 through 10/22/2020 with progression. - NGS testing showed benefit from pembrolizumab.  No mutations involving FGFR, NTRK was found.  Test was limited due to small sample. - CT CAP on 11/11/2020 showed interval progression of liver metastasis.  No significant change infiltrative mass arising in the upper pole of the left kidney invading the medial aspect of the upper spleen, left renal vein and left adrenal gland with soft tissue infiltration to the left retroperitoneal fat.  No evidence of metastatic  disease to the chest. - Progression on 3 cycles of enfortumab vedotin. - CT CAP on 02/15/2021 showed decrease in size of the left renal mass.  Interval progression of numerous liver metastasis. - Guardant 360 results-KRAS G12D, PDGFRA T230T, DDR2 [id number], MSI high not detected.   2.  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 - Reports exposure to carbon black dust.  Smoked half pack per day for 30 years, currently smoking 2 to 3 cigarettes/day. - No family history of malignancy.   PLAN:  1. Metastatic poorly differentiated urothelial carcinoma arising in the renal pelvis: - CT CAP (08/02/2021): Overall decrease in size of the bilobar metastatic liver lesions.  No new lesions.  Infiltrative left renal lesion invading left hemidiaphragm measures 6.9 x 3.6 cm, previously 7.7 x 3.2  cm.  No metastatic disease in the chest. - Because of repeated allergic reaction to sisters Mab, we had to discontinue it even though it was working. - He was evaluated at [facility].  No significant clinical trials are available. - We had a prolonged discussion about best supportive care in the form of hospice versus active treatment with chemotherapy.  He is interested in pursuing chemotherapy option. - We have discussed chemotherapy with gemcitabine and cisplatin.  He has some neuropathy.  He wants to maintain quality of life.  Hence have recommended gemcitabine and carboplatin regimen every 21 days. - We discussed side effects in detail.  Literature was given to the patient. - Recommend baseline CT CAP followed by return visit to the clinic with the intention of treating on the same day.     2.  Left upper quadrant pain: - He is taking oxycodone 20 mg 3 times daily.  Also taking ibuprofen 800 mg daily which is not helping much.  3.  Constipation: - Continue senna daily which is working.  4.  Sleeping difficulty: - Ambien 10 mg is not working.  We will start  him on trazodone 100 mg at bedtime.  5.  Neuropathy: - Numbness in the bottom of the toes and fingertips is stable.  6.  Anxiety: - He is taking Xanax 1 mg twice daily.  He may increase it to 1 mg 3 times daily as needed.     Orders placed this encounter:  No orders of the defined types were placed in this encounter.    Derek Jack, MD [place] [phone]   I, Thana Ates, am acting as a scribe for Dr. Derek Jack.  I, Derek Jack MD, have reviewed the above documentation for accuracy and completeness, and I agree with the above.

## 2021-09-16 ENCOUNTER — Encounter (HOSPITAL_COMMUNITY): Payer: Self-pay | Admitting: *Deleted

## 2021-09-16 NOTE — Progress Notes (Signed)
Approval for Oxycodone 20 mg tablets received from [employer].  Reference # [id number].  Valid through 04/03/2022.  Form scanned to Epic.

## 2021-09-20 ENCOUNTER — Encounter (HOSPITAL_COMMUNITY): Payer: Self-pay

## 2021-09-20 NOTE — Progress Notes (Signed)
Prior authorization for Oxycodone 20 mg submitted to [employer] via CoverMyMeds.  PA approved through 04/03/2022.

## 2021-09-21 ENCOUNTER — Ambulatory Visit (HOSPITAL_COMMUNITY): Payer: Medicare Other

## 2021-09-24 ENCOUNTER — Ambulatory Visit (HOSPITAL_COMMUNITY)
Admission: RE | Admit: 2021-09-24 | Discharge: 2021-09-24 | Disposition: A | Discharge status: Home / Self Care | Payer: Medicare Other | Source: Ambulatory Visit | Attending: Hematology | Admitting: Hematology

## 2021-09-24 DIAGNOSIS — C679 Malignant neoplasm of bladder, unspecified: Secondary | ICD-10-CM | POA: Insufficient documentation

## 2021-09-24 DIAGNOSIS — C229 Malignant neoplasm of liver, not specified as primary or secondary: Secondary | ICD-10-CM | POA: Diagnosis not present

## 2021-09-24 DIAGNOSIS — C787 Secondary malignant neoplasm of liver and intrahepatic bile duct: Secondary | ICD-10-CM | POA: Insufficient documentation

## 2021-09-24 DIAGNOSIS — Z9049 Acquired absence of other specified parts of digestive tract: Secondary | ICD-10-CM | POA: Insufficient documentation

## 2021-09-24 DIAGNOSIS — I129 Hypertensive chronic kidney disease with stage 1 through stage 4 chronic kidney disease, or unspecified chronic kidney disease: Secondary | ICD-10-CM | POA: Diagnosis not present

## 2021-09-24 DIAGNOSIS — N189 Chronic kidney disease, unspecified: Secondary | ICD-10-CM | POA: Diagnosis not present

## 2021-09-24 DIAGNOSIS — C7910 Secondary malignant neoplasm of unspecified urinary organs: Secondary | ICD-10-CM | POA: Diagnosis not present

## 2021-09-24 DIAGNOSIS — N2889 Other specified disorders of kidney and ureter: Secondary | ICD-10-CM | POA: Insufficient documentation

## 2021-09-24 DIAGNOSIS — Z79899 Other long term (current) drug therapy: Secondary | ICD-10-CM | POA: Diagnosis not present

## 2021-09-24 DIAGNOSIS — K219 Gastro-esophageal reflux disease without esophagitis: Secondary | ICD-10-CM | POA: Diagnosis not present

## 2021-09-24 DIAGNOSIS — I700 Atherosclerosis of aorta: Secondary | ICD-10-CM | POA: Diagnosis not present

## 2021-09-24 DIAGNOSIS — K7689 Other specified diseases of liver: Secondary | ICD-10-CM | POA: Diagnosis not present

## 2021-09-24 LAB — POCT I-STAT CREATININE: Creatinine, Ser: 1.2 mg/dL (ref 0.61–1.24)

## 2021-09-24 MED ORDER — HEPARIN SOD (PORK) LOCK FLUSH 100 UNIT/ML IV SOLN
INTRAVENOUS | Status: AC
  Filled 2021-09-24: qty 5

## 2021-09-24 MED ORDER — IOHEXOL 300 MG/ML  SOLN
100.0000 mL | Freq: Once | INTRAMUSCULAR | Status: AC | PRN
  Administered 2021-09-24: 100 mL via INTRAVENOUS

## 2021-09-28 ENCOUNTER — Other Ambulatory Visit (HOSPITAL_COMMUNITY): Payer: Self-pay

## 2021-09-28 DIAGNOSIS — C7910 Secondary malignant neoplasm of unspecified urinary organs: Secondary | ICD-10-CM

## 2021-09-28 DIAGNOSIS — C652 Malignant neoplasm of left renal pelvis: Secondary | ICD-10-CM

## 2021-09-29 MED FILL — Dexamethasone Sodium Phosphate Inj 100 MG/10ML: INTRAMUSCULAR | Qty: 1 | Status: AC

## 2021-09-29 MED FILL — Fosaprepitant Dimeglumine For IV Infusion 150 MG (Base Eq): INTRAVENOUS | Qty: 5 | Status: AC

## 2021-09-30 ENCOUNTER — Inpatient Hospital Stay (HOSPITAL_BASED_OUTPATIENT_CLINIC_OR_DEPARTMENT_OTHER): Payer: Medicare Other | Admitting: Hematology

## 2021-09-30 ENCOUNTER — Inpatient Hospital Stay (HOSPITAL_COMMUNITY): Payer: Medicare Other

## 2021-09-30 VITALS — BP 107/58 | HR 69

## 2021-09-30 VITALS — BP 122/67 | HR 99 | Temp 98.6°F | Resp 18 | Ht 65.75 in | Wt 147.5 lb

## 2021-09-30 DIAGNOSIS — I129 Hypertensive chronic kidney disease with stage 1 through stage 4 chronic kidney disease, or unspecified chronic kidney disease: Secondary | ICD-10-CM | POA: Diagnosis not present

## 2021-09-30 DIAGNOSIS — C7910 Secondary malignant neoplasm of unspecified urinary organs: Secondary | ICD-10-CM

## 2021-09-30 DIAGNOSIS — K5900 Constipation, unspecified: Secondary | ICD-10-CM | POA: Diagnosis not present

## 2021-09-30 DIAGNOSIS — R1012 Left upper quadrant pain: Secondary | ICD-10-CM | POA: Diagnosis not present

## 2021-09-30 DIAGNOSIS — C679 Malignant neoplasm of bladder, unspecified: Secondary | ICD-10-CM | POA: Diagnosis not present

## 2021-09-30 DIAGNOSIS — C787 Secondary malignant neoplasm of liver and intrahepatic bile duct: Secondary | ICD-10-CM | POA: Diagnosis not present

## 2021-09-30 DIAGNOSIS — G479 Sleep disorder, unspecified: Secondary | ICD-10-CM | POA: Diagnosis not present

## 2021-09-30 DIAGNOSIS — N189 Chronic kidney disease, unspecified: Secondary | ICD-10-CM | POA: Diagnosis not present

## 2021-09-30 DIAGNOSIS — F17210 Nicotine dependence, cigarettes, uncomplicated: Secondary | ICD-10-CM | POA: Diagnosis not present

## 2021-09-30 DIAGNOSIS — Z5111 Encounter for antineoplastic chemotherapy: Secondary | ICD-10-CM | POA: Diagnosis not present

## 2021-09-30 DIAGNOSIS — G629 Polyneuropathy, unspecified: Secondary | ICD-10-CM | POA: Diagnosis not present

## 2021-09-30 DIAGNOSIS — N2889 Other specified disorders of kidney and ureter: Secondary | ICD-10-CM | POA: Diagnosis not present

## 2021-09-30 LAB — CBC WITH DIFFERENTIAL/PLATELET
Abs Immature Granulocytes: 0.04 10*3/uL (ref 0.00–0.07)
Basophils Absolute: 0 10*3/uL (ref 0.0–0.1)
Basophils Relative: 0 %
Eosinophils Absolute: 0.1 10*3/uL (ref 0.0–0.5)
Eosinophils Relative: 1 %
HCT: 33 % — ABNORMAL LOW (ref 39.0–52.0)
Hemoglobin: 11.1 g/dL — ABNORMAL LOW (ref 13.0–17.0)
Immature Granulocytes: 0 %
Lymphocytes Relative: 15 %
Lymphs Abs: 1.4 10*3/uL (ref 0.7–4.0)
MCH: 29.8 pg (ref 26.0–34.0)
MCHC: 33.6 g/dL (ref 30.0–36.0)
MCV: 88.5 fL (ref 80.0–100.0)
Monocytes Absolute: 1 10*3/uL (ref 0.1–1.0)
Monocytes Relative: 11 %
Neutro Abs: 6.5 10*3/uL (ref 1.7–7.7)
Neutrophils Relative %: 73 %
Platelets: 268 10*3/uL (ref 150–400)
RBC: 3.73 MIL/uL — ABNORMAL LOW (ref 4.22–5.81)
RDW: 13.2 % (ref 11.5–15.5)
WBC: 9 10*3/uL (ref 4.0–10.5)
nRBC: 0 % (ref 0.0–0.2)

## 2021-09-30 LAB — COMPREHENSIVE METABOLIC PANEL
ALT: 34 U/L (ref 0–44)
AST: 30 U/L (ref 15–41)
Albumin: 2.7 g/dL — ABNORMAL LOW (ref 3.5–5.0)
Alkaline Phosphatase: 115 U/L (ref 38–126)
Anion gap: 5 (ref 5–15)
BUN: 14 mg/dL (ref 8–23)
CO2: 19 mmol/L — ABNORMAL LOW (ref 22–32)
Calcium: 9.2 mg/dL (ref 8.9–10.3)
Chloride: 109 mmol/L (ref 98–111)
Creatinine, Ser: 0.97 mg/dL (ref 0.61–1.24)
GFR, Estimated: >60 mL/min (ref >60)
Glucose, Bld: 143 mg/dL — ABNORMAL HIGH (ref 70–99)
Potassium: 4 mmol/L (ref 3.5–5.1)
Sodium: 133 mmol/L — ABNORMAL LOW (ref 135–145)
Total Bilirubin: 0.6 mg/dL (ref 0.3–1.2)
Total Protein: 6.9 g/dL (ref 6.5–8.1)

## 2021-09-30 LAB — MAGNESIUM: Magnesium: 1.6 mg/dL — ABNORMAL LOW (ref 1.7–2.4)

## 2021-09-30 MED ORDER — SODIUM CHLORIDE 0.9% FLUSH
10.0000 mL | INTRAVENOUS | Status: DC | PRN
  Administered 2021-09-30: 10 mL

## 2021-09-30 MED ORDER — SODIUM CHLORIDE 0.9 % IV SOLN
10.0000 mg | Freq: Once | INTRAVENOUS | Status: AC
  Administered 2021-09-30: 10 mg via INTRAVENOUS
  Filled 2021-09-30: qty 10

## 2021-09-30 MED ORDER — SODIUM CHLORIDE 0.9 % IV SOLN
402.3000 mg | Freq: Once | INTRAVENOUS | Status: AC
  Administered 2021-09-30: 400 mg via INTRAVENOUS
  Filled 2021-09-30: qty 40

## 2021-09-30 MED ORDER — PALONOSETRON HCL INJECTION 0.25 MG/5ML
0.2500 mg | Freq: Once | INTRAVENOUS | Status: AC
  Administered 2021-09-30: 0.25 mg via INTRAVENOUS
  Filled 2021-09-30: qty 5

## 2021-09-30 MED ORDER — SODIUM CHLORIDE 0.9 % IV SOLN
1000.0000 mg/m2 | Freq: Once | INTRAVENOUS | Status: AC
  Administered 2021-09-30: 1748 mg via INTRAVENOUS
  Filled 2021-09-30: qty 45.97

## 2021-09-30 MED ORDER — SODIUM CHLORIDE 0.9 % IV SOLN: Freq: Once | INTRAVENOUS | Status: AC

## 2021-09-30 MED ORDER — HEPARIN SOD (PORK) LOCK FLUSH 100 UNIT/ML IV SOLN
500.0000 [IU] | Freq: Once | INTRAVENOUS | Status: AC | PRN
  Administered 2021-09-30: 500 [IU]

## 2021-09-30 MED ORDER — PROCHLORPERAZINE MALEATE 10 MG PO TABS: 10.0000 mg | ORAL_TABLET | Freq: Four times a day (QID) | ORAL | 3 refills | Status: DC | PRN

## 2021-09-30 MED ORDER — SODIUM CHLORIDE 0.9 % IV SOLN
150.0000 mg | Freq: Once | INTRAVENOUS | Status: AC
  Administered 2021-09-30: 150 mg via INTRAVENOUS
  Filled 2021-09-30: qty 150

## 2021-09-30 NOTE — Progress Notes (Signed)
Patients port flushed without difficulty.  Good blood return noted with no bruising or swelling noted at site.  Patient remains accessed for chemotherapy treatment.  

## 2021-09-30 NOTE — Progress Notes (Signed)
Patient presents today for Day 1, Cycle 1 Gemzar and Carboplatin infusions per providers order.  Vital signs and Labs within parameters for treatment.  Patient has no new complaints at this time.  Message received from Anastasio Champion RN/Dr. Delton Coombes patient okay for treatment.  Gemzar and Carboplatin given today per MD orders. Stable during infusion without adverse affects.  Vital signs stable.  No complaints at this time.  Discharge from clinic ambulatory in stable condition.  Alert and oriented X 3.  Follow up with [facility] as scheduled.

## 2021-09-30 NOTE — Patient Instructions (Signed)
[place]  Discharge Instructions: Thank you for choosing [facility] to provide your oncology and hematology care.  If you have a lab appointment with the [facility], please come in thru the Main Entrance and check in at the main information desk.  Wear comfortable clothing and clothing appropriate for easy access to any Portacath or PICC line.   We strive to give you quality time with your provider. You may need to reschedule your appointment if you arrive late (15 or more minutes).  Arriving late affects you and other patients whose appointments are after yours.  Also, if you miss three or more appointments without notifying the office, you may be dismissed from the clinic at the provider's discretion.      For prescription refill requests, have your pharmacy contact our office and allow 72 hours for refills to be completed.    Today you received the following chemotherapy and/or immunotherapy agents Gemzar Carboplatin      To help prevent nausea and vomiting after your treatment, we encourage you to take your nausea medication as directed.  BELOW ARE SYMPTOMS THAT SHOULD BE REPORTED IMMEDIATELY: *FEVER GREATER THAN 100.4 F (38 C) OR HIGHER *CHILLS OR SWEATING *NAUSEA AND VOMITING THAT IS NOT CONTROLLED WITH YOUR NAUSEA MEDICATION *UNUSUAL SHORTNESS OF BREATH *UNUSUAL BRUISING OR BLEEDING *URINARY PROBLEMS (pain or burning when urinating, or frequent urination) *BOWEL PROBLEMS (unusual diarrhea, constipation, pain near the anus) TENDERNESS IN MOUTH AND THROAT WITH OR WITHOUT PRESENCE OF ULCERS (sore throat, sores in mouth, or a toothache) UNUSUAL RASH, SWELLING OR PAIN  UNUSUAL VAGINAL DISCHARGE OR ITCHING   Items with * indicate a potential emergency and should be followed up as soon as possible or go to the Emergency Department if any problems should occur.  Please show the CHEMOTHERAPY ALERT CARD or IMMUNOTHERAPY ALERT CARD at check-in to the  Emergency Department and triage nurse.  Should you have questions after your visit or need to cancel or reschedule your appointment, please contact [facility] [phone]  and follow the prompts.  Office hours are 8:00 a.m. to 4:30 p.m. Monday - Friday. Please note that voicemails left after 4:00 p.m. may not be returned until the following business day.  We are closed weekends and major holidays. You have access to a nurse at all times for urgent questions. Please call the main number to the clinic [phone] and follow the prompts.  For any non-urgent questions, you may also contact your provider using MyChart. We now offer e-Visits for anyone 39 and older to request care online for non-urgent symptoms. For details visit [email or web address].   Also download the MyChart app! Go to the app store, search "MyChart", open the app, select [facility], and log in with your MyChart username and password.  Masks are optional in the cancer centers. If you would like for your care team to wear a mask while they are taking care of you, please let them know. For doctor visits, patients may have with them one support person who is at least 71 years old. At this time, visitors are not allowed in the infusion area.

## 2021-09-30 NOTE — Patient Instructions (Addendum)
[place] at [facility] Discharge Instructions   You were seen and examined today by Dr. Delton Coombes.  He reviewed the results of your CT scan. It shows that the cancer has gotten bigger, which is expected given that you have been off treatment for awhile.   We will proceed with your first treatment of Gemzar and carboplatin today.   Return as scheduled.    Thank you for choosing [place] at [facility] to provide your oncology and hematology care.  To afford each patient quality time with our provider, please arrive at least 15 minutes before your scheduled appointment time.   If you have a lab appointment with the [facility] please come in thru the Main Entrance and check in at the main information desk.  You need to re-schedule your appointment should you arrive 10 or more minutes late.  We strive to give you quality time with our providers, and arriving late affects you and other patients whose appointments are after yours.  Also, if you no show three or more times for appointments you may be dismissed from the clinic at the providers discretion.     Again, thank you for choosing [facility].  Our hope is that these requests will decrease the amount of time that you wait before being seen by our physicians.       _____________________________________________________________  Should you have questions after your visit to [facility], please contact our office at [phone] and follow the prompts.  Our office hours are 8:00 a.m. and 4:30 p.m. Monday - Friday.  Please note that voicemails left after 4:00 p.m. may not be returned until the following business day.  We are closed weekends and major holidays.  You do have access to a nurse 24-7, just call the main number to the clinic [phone] and do not press any options, hold on the line and a nurse will answer the phone.    For prescription refill requests,  have your pharmacy contact our office and allow 72 hours.    Due to Covid, you will need to wear a mask upon entering the hospital. If you do not have a mask, a mask will be given to you at the Main Entrance upon arrival. For doctor visits, patients may have 1 support person age 32 or older with them. For treatment visits, patients can not have anyone with them due to social distancing guidelines and our immunocompromised population.

## 2021-09-30 NOTE — Progress Notes (Signed)
[facility] [address], [place] [address]   CLINIC:  Medical Oncology/Hematology  PCP:  Lindell Spar, MD [address] / [place] [address] [phone]   REASON FOR VISIT:  Follow-up for metastatic urothelial carcinoma  PRIOR THERAPY:  Keytruda every 3 weeks Enfortumab every 4 weeks Sacituzumab govitecan-hziy [name]  NGS Results: not done  CURRENT THERAPY: Carboplatin D1 / Gemcitabine D1,8 q21d  BRIEF ONCOLOGIC HISTORY:  Oncology History  Metastatic urothelial carcinoma ([place])  08/13/2020 Initial Diagnosis   Metastatic urothelial carcinoma ([facility] [place])   08/13/2020 Cancer Staging   Staging form: Urinary Bladder, AJCC 8th Edition - Clinical stage from 08/13/2020: Stage IVB (cTX, cN0, pM1b) - Signed by Derek Jack, MD on 08/13/2020 Stage prefix: Initial diagnosis WHO/ISUP grade (low/high): High Grade Histologic grading system: 2 grade system   08/20/2020 - 10/22/2020 Chemotherapy         11/19/2020 - 01/28/2021 Chemotherapy   Patient is on Treatment Plan : UROTHELIAL LOCALLY ADVANCED / METASTATIC Enfortumab q28d     02/15/2021 - 07/21/2021 Chemotherapy   Patient is on Treatment Plan : BLADDER Sacituzumab govitecan-hziy Ivette Loyal) q21d     09/30/2021 -  Chemotherapy   Patient is on Treatment Plan : BLADDER Carboplatin D1 / Gemcitabine D1,8 q21d       CANCER STAGING:  Cancer Staging  Metastatic urothelial carcinoma ([place]) Staging form: Urinary Bladder, AJCC 8th Edition - Clinical stage from 08/13/2020: Stage IVB (cTX, cN0, pM1b) - Signed by Derek Jack, MD on 08/13/2020   INTERVAL HISTORY:  [name], a 71 y.o. male, returns for routine follow-up and consideration for next cycle of chemotherapy. Jeremy Rodriguez was last seen on 09/15/2021.  Due for cycle #1 of Carboplatin and Gemcitabine today.   Overall, he tells me he has been feeling pretty well. He continues to have occasional stabbing abdominal  pain. He continues to take Megace, and his appetite is good. He takes oxycodone 3 times daily. He denies nausea. He has gained 2 lbs since 6/14.   Overall, he feels ready for next cycle of chemo today.    REVIEW OF SYSTEMS:  Review of Systems  Constitutional:  Negative for appetite change and fatigue.  Respiratory:  Positive for cough.   Cardiovascular:  Positive for chest pain.  Gastrointestinal:  Positive for abdominal pain (6/10 L side). Negative for nausea.  Neurological:  Positive for headaches and numbness.  All other systems reviewed and are negative.   PAST MEDICAL/SURGICAL HISTORY:  Past Medical History:  Diagnosis Date   Chronic kidney disease    GERD (gastroesophageal reflux disease)    Hypertension    Medical history non-contributory    Past Surgical History:  Procedure Laterality Date   appendectomy     PORTACATH PLACEMENT Right 05/26/2021   Procedure: INSERTION PORT-A-CATH- IJ;  Surgeon: Rusty Aus, DO;  Location: [facility];  Service: General;  Laterality: Right;  Internal Jugular     SOCIAL HISTORY:  Social History   Socioeconomic History   Marital status: Single    Spouse name: Not on file   Number of children: Not on file   Years of education: Not on file   Highest education level: Not on file  Occupational History   Not on file  Tobacco Use   Smoking status: Some Days   Smokeless tobacco: Never   Tobacco comments:    a few cigarettes a day  Substance and Sexual Activity   Alcohol use: Yes  Comment: occ   Drug use: Never   Sexual activity: Not on file  Other Topics Concern   Not on file  Social History Narrative   Not on file   Social Determinants of Health   Financial Resource Strain: Low Risk  (11/09/2020)   Overall Financial Resource Strain (CARDIA)    Difficulty of Paying Living Expenses: Not hard at all  Food Insecurity: No Food Insecurity (11/09/2020)   Hunger Vital Sign    Worried About Running Out of Food in the Last Year:  Never true    Ran Out of Food in the Last Year: Never true  Transportation Needs: No Transportation Needs (11/09/2020)   PRAPARE - Hydrologist (Medical): No    Lack of Transportation (Non-Medical): No  Physical Activity: Unknown (11/09/2020)   Exercise Vital Sign    Days of Exercise per Week: 5 days    Minutes of Exercise per Session: Not on file  Stress: No Stress Concern Present (11/09/2020)   [place]    Feeling of Stress : Not at all  Social Connections: Moderately Isolated (11/09/2020)   Social Connection and Isolation Panel [NHANES]    Frequency of Communication with Friends and Family: More than three times a week    Frequency of Social Gatherings with Friends and Family: More than three times a week    Attends Religious Services: More than 4 times per year    Active Member of [employer] or Organizations: No    Attends Archivist Meetings: Never    Marital Status: Divorced  Human resources officer Violence: Not At Risk (11/09/2020)   Humiliation, Afraid, Rape, and Kick questionnaire    Fear of Current or Ex-Partner: No    Emotionally Abused: No    Physically Abused: No    Sexually Abused: No    FAMILY HISTORY:  Family History  Problem Relation Age of Onset   Colon cancer Neg Hx    Colon polyps Neg Hx    Liver cancer Neg Hx    Pancreatic cancer Neg Hx     CURRENT MEDICATIONS:  Current Outpatient Medications  Medication Sig Dispense Refill   ALPRAZolam (XANAX) 1 MG tablet Take 1 tablet (1 mg total) by mouth 2 (two) times daily. 60 tablet 3   dicyclomine (BENTYL) 10 MG capsule Take 1 capsule (10 mg total) by mouth 3 (three) times daily as needed for spasms. (Patient taking differently: Take 10 mg by mouth 3 (three) times daily before meals.) 90 capsule 6   docusate sodium (COLACE) 100 MG capsule Take 1 capsule (100 mg total) by mouth daily as needed for mild constipation. 30 capsule 0    escitalopram (LEXAPRO) 10 MG tablet Take 1 tablet (10 mg total) by mouth daily. 30 tablet 1   ibuprofen (ADVIL) 800 MG tablet Take 1 tablet (800 mg total) by mouth 2 (two) times daily as needed. 60 tablet 5   Lactulose 20 GM/30ML SOLN Take 30 mLs (20 g total) by mouth daily. (Patient taking differently: Take 30 mLs by mouth daily as needed (Constipation).) 473 mL 6   lidocaine-prilocaine (EMLA) cream Apply 1 application. topically as needed. 30 g 0   linaclotide (LINZESS) 145 MCG CAPS capsule Take 1 capsule (145 mcg total) by mouth daily before breakfast. 30 capsule 2   megestrol (MEGACE) 400 MG/10ML suspension Take 10 mLs (400 mg total) by mouth daily. 480 mL 3   Oxycodone HCl 20 MG TABS  Take 0.75 tablets (15 mg total) by mouth every 8 (eight) hours as needed. 84 tablet 0   pantoprazole (PROTONIX) 40 MG tablet Take 1 tablet (40 mg total) by mouth daily. 30 tablet 6   prochlorperazine (COMPAZINE) 10 MG tablet Take 1 tablet (10 mg total) by mouth every 6 (six) hours as needed for nausea or vomiting. 30 tablet 3   sennosides-docusate sodium (SENOKOT-S) 8.6-50 MG tablet Take 1 tablet by mouth daily.     tadalafil (CIALIS) 10 MG tablet Take 2 tablets (20 mg total) by mouth daily as needed for erectile dysfunction. 30 tablet 3   telmisartan (MICARDIS) 20 MG tablet Take 1 tablet by mouth once daily 90 tablet 0   traZODone (DESYREL) 100 MG tablet Take 1 tablet (100 mg total) by mouth at bedtime. 30 tablet 2   zolpidem (AMBIEN) 10 MG tablet Take 1 tablet (10 mg total) by mouth at bedtime as needed for sleep. 30 tablet 2   No current facility-administered medications for this visit.   Facility-Administered Medications Ordered in Other Visits  Medication Dose Route Frequency Provider Last Rate Last Admin   acetaminophen (TYLENOL) 325 MG tablet            famotidine (PEPCID) 20-0.9 MG/50ML-% IVPB            palonosetron (ALOXI) 0.25 MG/5ML injection            sodium chloride flush (NS) 0.9 % injection  10 mL  10 mL Intravenous PRN Derek Jack, MD   10 mL at 08/04/21 1030    ALLERGIES:  Allergies  Allergen Reactions   Ivette Loyal [Sacituzumab Govitecan-Hziy] Other (See Comments)    See notes from 07/14/21 and 07/21/21        PHYSICAL EXAM:  Performance status (ECOG): 1 - Symptomatic but completely ambulatory  There were no vitals filed for this visit. Wt Readings from Last 3 Encounters:  09/15/21 145 lb 15.1 oz (66.2 kg)  08/04/21 152 lb 12.8 oz (69.3 kg)  07/21/21 151 lb 12.8 oz (68.9 kg)   Physical Exam Vitals reviewed.  Constitutional:      Appearance: Normal appearance.  Cardiovascular:     Rate and Rhythm: Normal rate and regular rhythm.     Pulses: Normal pulses.     Heart sounds: Normal heart sounds.  Pulmonary:     Effort: Pulmonary effort is normal.     Breath sounds: Normal breath sounds.  Neurological:     General: No focal deficit present.     Mental Status: He is alert and oriented to person, place, and time.  Psychiatric:        Mood and Affect: Mood normal.        Behavior: Behavior normal.     LABORATORY DATA:  I have reviewed the labs as listed.     Latest Ref Rng & Units 08/04/2021    9:32 AM 07/21/2021    8:10 AM 07/14/2021    9:15 AM  CBC  WBC 4.0 - 10.5 K/uL 5.9  6.3  3.5   Hemoglobin 13.0 - 17.0 g/dL 12.2  12.7  11.7   Hematocrit 39.0 - 52.0 % 36.4  37.2  34.2   Platelets 150 - 400 K/uL 231  215  217       Latest Ref Rng & Units 09/24/2021   10:48 AM 08/04/2021    9:32 AM 07/21/2021    8:10 AM  CMP  Glucose 70 - 99 mg/dL  127  159  BUN 8 - 23 mg/dL  11  14   Creatinine 0.61 - 1.24 mg/dL 1.20  0.82  1.01   Sodium 135 - 145 mmol/L  137  137   Potassium 3.5 - 5.1 mmol/L  4.0  3.8   Chloride 98 - 111 mmol/L  107  108   CO2 22 - 32 mmol/L  23  21   Calcium 8.9 - 10.3 mg/dL  8.8  8.9   Total Protein 6.5 - 8.1 g/dL  6.7  6.8   Total Bilirubin 0.3 - 1.2 mg/dL  0.6  0.6   Alkaline Phos 38 - 126 U/L  94  102   AST 15 - 41 U/L  24  32    ALT 0 - 44 U/L  27  35     DIAGNOSTIC IMAGING:  I have independently reviewed the scans and discussed with the patient.    ASSESSMENT:  1. 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 - 20 pound weight loss in the last 4 months due to decreased appetite.  Left upper quadrant pain for the past 2 to 3 months. - CTAP with contrast on 07/10/2020 showed ill-defined low-density mass in the right hepatic lobe measuring 2 x 2 cm.  Left upper pole kidney mass measuring 7.6 x 5.9 cm with encasement and narrowing of the left renal artery and vein.  No intravascular thrombus noted.  Mass encases the left adrenal gland.  Small right adrenal nodule measuring 11 mm, likely benign.  No other evidence of metastatic disease. - MRI of the liver with and without contrast on 07/20/2020 shows 2.2 x 1.7 cm enhancing lesion in the medial aspect of the segment 7, suspicious for metastasis.  Left renal mass, 6 x 7.6 x 6.9 cm infiltrating enhancing left upper pole renal mass, extending into the left renal sinus.  Possible TCC or lymphoma rather than renal cell carcinoma. - CT chest on 07/20/2020 with no evidence of metastatic disease. - PET scan on 08/04/2020 showed large hypermetabolic left upper pole mass and 2 hypermetabolic lesions in the right hepatic lobe favoring metastatic disease.  No hypermetabolic adenopathy.  - Liver biopsy showed poorly differentiated metastatic urothelial carcinoma.  IHC positive for CK5/6, CK7, GATA3, p40 and patchy positivity with p63 and PAX8. - He refused any chemotherapy. - 4 cycles of pembrolizumab from 08/20/2020 through 10/22/2020 with progression. - NGS testing showed benefit from pembrolizumab.  No mutations involving FGFR, NTRK was found.  Test was limited due to small sample. - CT CAP on 11/11/2020 showed interval progression of liver metastasis.  No significant change  infiltrative mass arising in the upper pole of the left kidney invading the medial aspect of the upper spleen, left renal vein and left adrenal gland with soft tissue infiltration to the left retroperitoneal fat.  No evidence of metastatic disease to the chest. - Progression on 3 cycles of enfortumab vedotin. - CT CAP on 02/15/2021 showed decrease in size of the left renal mass.  Interval progression of numerous liver metastasis. - Guardant 360 results-KRAS G12D, PDGFRA T230T, DDR2 [id number], MSI high not detected. - 8 cycles of sacituzumab govitecan completed on 07/14/2021, discontinued secondary to allergic reactions. - Carboplatin and gemcitabine cycle 1 started on 09/30/2021.  Carboplatin in place of cisplatin was chosen to maintain quality of life.   2.  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 - Reports exposure to carbon black dust.  Smoked half pack per day for 30 years, currently smoking 2 to 3 cigarettes/day. - No family history of malignancy.   PLAN:  1. Metastatic poorly differentiated urothelial carcinoma arising in the renal pelvis: - CT CAP (09/24/2021): Mild increase in size of infiltrative mass involving the left kidney which extends into the left retroperitoneum and invades the crux of the left hemidiaphragm.  Interval progression of liver metastasis.  No evidence of metastatic disease in the chest. - He has lost 5 pounds.  We have reviewed labs today which were grossly within normal limits. - We talked about gemcitabine and carboplatin day 1 and day 8 every 21 days.  We discussed side effects in detail. - He will proceed with cycle 1 day 1 today.  RTC 3 weeks for follow-up.     2.  Left upper quadrant pain: - He is taking oxycodone 20 mg 3 times daily along with senna S.  He is also taking ibuprofen 800 mg x 1 daily.  3.  Constipation: - Continue senna daily which is helping.  4.  Sleeping difficulty: -Continue trazodone  100 mg as needed at bedtime.  5.  Neuropathy: - Numbness in the bottom of the toes and fingertips is stable.  6.  Anxiety: - Continue Xanax 1 mg twice daily.  May increase to 3 times a day as needed.  7.  Weight loss: - He lost about 5 pounds since last visit.  Appetite is 6 out of 10.  Continue Megace twice daily.  We will closely monitor.   Orders placed this encounter:  No orders of the defined types were placed in this encounter.    Derek Jack, MD [facility] [phone]   I, Thana Ates, am acting as a scribe for Dr. Derek Jack.  I, Derek Jack MD, have reviewed the above documentation for accuracy and completeness, and I agree with the above.

## 2021-10-06 ENCOUNTER — Other Ambulatory Visit (HOSPITAL_COMMUNITY): Payer: Self-pay | Admitting: *Deleted

## 2021-10-06 MED ORDER — OXYCODONE HCL 20 MG PO TABS: 15.0000 mg | ORAL_TABLET | Freq: Three times a day (TID) | ORAL | 0 refills | Status: DC | PRN

## 2021-10-06 NOTE — Progress Notes (Signed)
Late entry 09/3021  24 hour call back.  No answer.

## 2021-10-07 ENCOUNTER — Inpatient Hospital Stay (HOSPITAL_COMMUNITY): Payer: Medicare Other

## 2021-10-07 ENCOUNTER — Encounter (HOSPITAL_COMMUNITY): Payer: Self-pay

## 2021-10-07 ENCOUNTER — Inpatient Hospital Stay (HOSPITAL_COMMUNITY): Payer: Medicare Other | Attending: Hematology

## 2021-10-07 ENCOUNTER — Other Ambulatory Visit (HOSPITAL_COMMUNITY): Payer: Self-pay

## 2021-10-07 VITALS — BP 128/64 | HR 86 | Temp 98.0°F | Resp 18 | Ht 66.0 in | Wt 143.4 lb

## 2021-10-07 DIAGNOSIS — C787 Secondary malignant neoplasm of liver and intrahepatic bile duct: Secondary | ICD-10-CM | POA: Insufficient documentation

## 2021-10-07 DIAGNOSIS — C679 Malignant neoplasm of bladder, unspecified: Secondary | ICD-10-CM | POA: Insufficient documentation

## 2021-10-07 DIAGNOSIS — Z5111 Encounter for antineoplastic chemotherapy: Secondary | ICD-10-CM | POA: Insufficient documentation

## 2021-10-07 DIAGNOSIS — C652 Malignant neoplasm of left renal pelvis: Secondary | ICD-10-CM

## 2021-10-07 DIAGNOSIS — C7910 Secondary malignant neoplasm of unspecified urinary organs: Secondary | ICD-10-CM

## 2021-10-07 DIAGNOSIS — R066 Hiccough: Secondary | ICD-10-CM

## 2021-10-07 DIAGNOSIS — R790 Abnormal level of blood mineral: Secondary | ICD-10-CM

## 2021-10-07 LAB — COMPREHENSIVE METABOLIC PANEL
ALT: 41 U/L (ref 0–44)
AST: 27 U/L (ref 15–41)
Albumin: 2.5 g/dL — ABNORMAL LOW (ref 3.5–5.0)
Alkaline Phosphatase: 130 U/L — ABNORMAL HIGH (ref 38–126)
Anion gap: 6 (ref 5–15)
BUN: 17 mg/dL (ref 8–23)
CO2: 19 mmol/L — ABNORMAL LOW (ref 22–32)
Calcium: 8.5 mg/dL — ABNORMAL LOW (ref 8.9–10.3)
Chloride: 105 mmol/L (ref 98–111)
Creatinine, Ser: 1.01 mg/dL (ref 0.61–1.24)
GFR, Estimated: >60 mL/min (ref >60)
Glucose, Bld: 182 mg/dL — ABNORMAL HIGH (ref 70–99)
Potassium: 4 mmol/L (ref 3.5–5.1)
Sodium: 130 mmol/L — ABNORMAL LOW (ref 135–145)
Total Bilirubin: 0.9 mg/dL (ref 0.3–1.2)
Total Protein: 6.6 g/dL (ref 6.5–8.1)

## 2021-10-07 LAB — CBC WITH DIFFERENTIAL/PLATELET
Basophils Absolute: 0 10*3/uL (ref 0.0–0.1)
Basophils Relative: 0 %
Eosinophils Absolute: 0 10*3/uL (ref 0.0–0.5)
Eosinophils Relative: 1 %
HCT: 28.2 % — ABNORMAL LOW (ref 39.0–52.0)
Hemoglobin: 9.5 g/dL — ABNORMAL LOW (ref 13.0–17.0)
Lymphocytes Relative: 8 %
Lymphs Abs: 0.6 10*3/uL — ABNORMAL LOW (ref 0.7–4.0)
MCH: 29.5 pg (ref 26.0–34.0)
MCHC: 33.7 g/dL (ref 30.0–36.0)
MCV: 87.6 fL (ref 80.0–100.0)
Monocytes Absolute: 0.5 10*3/uL (ref 0.1–1.0)
Monocytes Relative: 7 %
Neutro Abs: 6.3 10*3/uL (ref 1.7–7.7)
Neutrophils Relative %: 83 %
Platelets: 108 10*3/uL — ABNORMAL LOW (ref 150–400)
RBC: 3.22 MIL/uL — ABNORMAL LOW (ref 4.22–5.81)
RDW: 13.1 % (ref 11.5–15.5)
WBC: 7.9 10*3/uL (ref 4.0–10.5)

## 2021-10-07 LAB — MAGNESIUM: Magnesium: 1.6 mg/dL — ABNORMAL LOW (ref 1.7–2.4)

## 2021-10-07 MED ORDER — SODIUM CHLORIDE 0.9 % IV SOLN: Freq: Once | INTRAVENOUS | Status: AC

## 2021-10-07 MED ORDER — SODIUM CHLORIDE 0.9% FLUSH
10.0000 mL | INTRAVENOUS | Status: DC | PRN
  Administered 2021-10-07 (×2): 10 mL

## 2021-10-07 MED ORDER — MAGNESIUM OXIDE -MG SUPPLEMENT 400 (240 MG) MG PO TABS
400.0000 mg | ORAL_TABLET | Freq: Once | ORAL | Status: AC
  Administered 2021-10-07: 400 mg via ORAL
  Filled 2021-10-07: qty 1

## 2021-10-07 MED ORDER — SODIUM CHLORIDE 0.9 % IV SOLN
1000.0000 mg/m2 | Freq: Once | INTRAVENOUS | Status: AC
  Administered 2021-10-07: 1748 mg via INTRAVENOUS
  Filled 2021-10-07: qty 45.97

## 2021-10-07 MED ORDER — PROCHLORPERAZINE MALEATE 10 MG PO TABS
10.0000 mg | ORAL_TABLET | Freq: Once | ORAL | Status: AC
  Administered 2021-10-07: 10 mg via ORAL
  Filled 2021-10-07: qty 1

## 2021-10-07 MED ORDER — BACLOFEN 10 MG PO TABS: 5.0000 mg | ORAL_TABLET | Freq: Three times a day (TID) | ORAL | 0 refills | Status: DC

## 2021-10-07 MED ORDER — HEPARIN SOD (PORK) LOCK FLUSH 100 UNIT/ML IV SOLN
500.0000 [IU] | Freq: Once | INTRAVENOUS | Status: AC | PRN
  Administered 2021-10-07: 500 [IU]

## 2021-10-07 NOTE — Progress Notes (Signed)
Patient presents today for Gemzar. Labs within treatment parameters. Patient reports hiccups, Dr. Delton Coombes made aware received orders to send in prescription for Baclofen '5mg'$ , patient made aware. Magnesium 1.6,'400mg'$  of PO Magnesium oxide ordered. Patient tolerated chemotherapy with no complaints voiced. Side effects with management reviewed understanding verbalized. Port site clean and dry with no bruising or swelling noted at site. Good blood return noted before and after administration of chemotherapy. Band aid applied. Patient left in satisfactory condition with VSS and no s/s of distress noted.

## 2021-10-07 NOTE — Patient Instructions (Signed)
[facility]  Discharge Instructions: Thank you for choosing [facility] to provide your oncology and hematology care.  If you have a lab appointment with the [facility], please come in thru the Main Entrance and check in at the main information desk.  Wear comfortable clothing and clothing appropriate for easy access to any Portacath or PICC line.   We strive to give you quality time with your provider. You may need to reschedule your appointment if you arrive late (15 or more minutes).  Arriving late affects you and other patients whose appointments are after yours.  Also, if you miss three or more appointments without notifying the office, you may be dismissed from the clinic at the provider's discretion.      For prescription refill requests, have your pharmacy contact our office and allow 72 hours for refills to be completed.    Today you received the following chemotherapy and/or immunotherapy agents Gemzar and magnesium oxide. A prescription for Baclofen was sent to your pharmacy for Hiccups. Gemcitabine injection What is this medication? GEMCITABINE (jem SYE ta been) is a chemotherapy drug. This medicine is used to treat many types of cancer like breast cancer, lung cancer, pancreatic cancer, and ovarian cancer. This medicine may be used for other purposes; ask your health care provider or pharmacist if you have questions. COMMON BRAND NAME(S): Gemzar, Infugem What should I tell my care team before I take this medication? They need to know if you have any of these conditions: blood disorders infection kidney disease liver disease lung or breathing disease, like asthma recent or ongoing radiation therapy an unusual or allergic reaction to gemcitabine, other chemotherapy, other medicines, foods, dyes, or preservatives pregnant or trying to get pregnant breast-feeding How should I use this medication? This drug is given as an infusion into a vein. It is  administered in a hospital or clinic by a specially trained health care professional. Talk to your pediatrician regarding the use of this medicine in children. Special care may be needed. Overdosage: If you think you have taken too much of this medicine contact a poison control center or emergency room at once. NOTE: This medicine is only for you. Do not share this medicine with others. What if I miss a dose? It is important not to miss your dose. Call your doctor or health care professional if you are unable to keep an appointment. What may interact with this medication? medicines to increase blood counts like filgrastim, pegfilgrastim, sargramostim some other chemotherapy drugs like cisplatin vaccines Talk to your doctor or health care professional before taking any of these medicines: acetaminophen aspirin ibuprofen ketoprofen naproxen This list may not describe all possible interactions. Give your health care provider a list of all the medicines, herbs, non-prescription drugs, or dietary supplements you use. Also tell them if you smoke, drink alcohol, or use illegal drugs. Some items may interact with your medicine. What should I watch for while using this medication? Visit your doctor for checks on your progress. This drug may make you feel generally unwell. This is not uncommon, as chemotherapy can affect healthy cells as well as cancer cells. Report any side effects. Continue your course of treatment even though you feel ill unless your doctor tells you to stop. In some cases, you may be given additional medicines to help with side effects. Follow all directions for their use. Call your doctor or health care professional for advice if you get a fever, chills or sore  throat, or other symptoms of a cold or flu. Do not treat yourself. This drug decreases your body's ability to fight infections. Try to avoid being around people who are sick. This medicine may increase your risk to bruise or  bleed. Call your doctor or health care professional if you notice any unusual bleeding. Be careful brushing and flossing your teeth or using a toothpick because you may get an infection or bleed more easily. If you have any dental work done, tell your dentist you are receiving this medicine. Avoid taking products that contain aspirin, acetaminophen, ibuprofen, naproxen, or ketoprofen unless instructed by your doctor. These medicines may hide a fever. Do not become pregnant while taking this medicine or for 6 months after stopping it. Women should inform their doctor if they wish to become pregnant or think they might be pregnant. Men should not father a child while taking this medicine and for 3 months after stopping it. There is a potential for serious side effects to an unborn child. Talk to your health care professional or pharmacist for more information. Do not breast-feed an infant while taking this medicine or for at least 1 week after stopping it. Men should inform their doctors if they wish to father a child. This medicine may lower sperm counts. Talk with your doctor or health care professional if you are concerned about your fertility. What side effects may I notice from receiving this medication? Side effects that you should report to your doctor or health care professional as soon as possible: allergic reactions like skin rash, itching or hives, swelling of the face, lips, or tongue breathing problems pain, redness, or irritation at site where injected signs and symptoms of a dangerous change in heartbeat or heart rhythm like chest pain; dizziness; fast or irregular heartbeat; palpitations; feeling faint or lightheaded, falls; breathing problems signs of decreased platelets or bleeding - bruising, pinpoint red spots on the skin, black, tarry stools, blood in the urine signs of decreased red blood cells - unusually weak or tired, feeling faint or lightheaded, falls signs of infection - fever  or chills, cough, sore throat, pain or difficulty passing urine signs and symptoms of kidney injury like trouble passing urine or change in the amount of urine signs and symptoms of liver injury like dark yellow or brown urine; general ill feeling or flu-like symptoms; light-colored stools; loss of appetite; nausea; right upper belly pain; unusually weak or tired; yellowing of the eyes or skin swelling of ankles, feet, hands Side effects that usually do not require medical attention (report to your doctor or health care professional if they continue or are bothersome): constipation diarrhea hair loss loss of appetite nausea rash vomiting This list may not describe all possible side effects. Call your doctor for medical advice about side effects. You may report side effects to FDA at 1-800-FDA-1088. Where should I keep my medication? This drug is given in a hospital or clinic and will not be stored at home. NOTE: This sheet is a summary. It may not cover all possible information. If you have questions about this medicine, talk to your doctor, pharmacist, or health care provider.  2023 Elsevier/Gold Standard (2017-06-14 00:00:00)    To help prevent nausea and vomiting after your treatment, we encourage you to take your nausea medication as directed.  BELOW ARE SYMPTOMS THAT SHOULD BE REPORTED IMMEDIATELY: *FEVER GREATER THAN 100.4 F (38 C) OR HIGHER *CHILLS OR SWEATING *NAUSEA AND VOMITING THAT IS NOT CONTROLLED WITH YOUR NAUSEA MEDICATION *  UNUSUAL SHORTNESS OF BREATH *UNUSUAL BRUISING OR BLEEDING *URINARY PROBLEMS (pain or burning when urinating, or frequent urination) *BOWEL PROBLEMS (unusual diarrhea, constipation, pain near the anus) TENDERNESS IN MOUTH AND THROAT WITH OR WITHOUT PRESENCE OF ULCERS (sore throat, sores in mouth, or a toothache) UNUSUAL RASH, SWELLING OR PAIN  UNUSUAL VAGINAL DISCHARGE OR ITCHING   Items with * indicate a potential emergency and should be followed  up as soon as possible or go to the Emergency Department if any problems should occur.  Please show the CHEMOTHERAPY ALERT CARD or IMMUNOTHERAPY ALERT CARD at check-in to the Emergency Department and triage nurse.  Should you have questions after your visit or need to cancel or reschedule your appointment, please contact [facility] [phone]  and follow the prompts.  Office hours are 8:00 a.m. to 4:30 p.m. Monday - Friday. Please note that voicemails left after 4:00 p.m. may not be returned until the following business day.  We are closed weekends and major holidays. You have access to a nurse at all times for urgent questions. Please call the main number to the clinic [phone] and follow the prompts.  For any non-urgent questions, you may also contact your provider using MyChart. We now offer e-Visits for anyone 73 and older to request care online for non-urgent symptoms. For details visit [email or web address].   Also download the MyChart app! Go to the app store, search "MyChart", open the app, select [facility], and log in with your MyChart username and password.  Masks are optional in the cancer centers. If you would like for your care team to wear a mask while they are taking care of you, please let them know. For doctor visits, patients may have with them one support person who is at least 71 years old. At this time, visitors are not allowed in the infusion area.

## 2021-10-08 ENCOUNTER — Encounter (HOSPITAL_COMMUNITY): Payer: Self-pay | Admitting: Hematology

## 2021-10-09 ENCOUNTER — Encounter (HOSPITAL_COMMUNITY): Payer: Self-pay

## 2021-10-10 ENCOUNTER — Encounter (HOSPITAL_COMMUNITY): Payer: Self-pay

## 2021-10-11 ENCOUNTER — Other Ambulatory Visit (HOSPITAL_COMMUNITY): Payer: Self-pay | Admitting: *Deleted

## 2021-10-11 DIAGNOSIS — C652 Malignant neoplasm of left renal pelvis: Secondary | ICD-10-CM

## 2021-10-11 DIAGNOSIS — C7910 Secondary malignant neoplasm of unspecified urinary organs: Secondary | ICD-10-CM

## 2021-10-11 MED ORDER — TRAZODONE HCL 100 MG PO TABS: 100.0000 mg | ORAL_TABLET | Freq: Every day | ORAL | 2 refills | Status: DC

## 2021-10-11 MED ORDER — OXYCODONE HCL 20 MG PO TABS
15.0000 mg | ORAL_TABLET | Freq: Three times a day (TID) | ORAL | 0 refills | Status: DC | PRN
Start: 2021-10-11 — End: 2021-10-12

## 2021-10-11 MED ORDER — LIDOCAINE-PRILOCAINE 2.5-2.5 % EX CREA: 1.0000 | TOPICAL_CREAM | CUTANEOUS | 0 refills | Status: AC | PRN

## 2021-10-12 ENCOUNTER — Other Ambulatory Visit (HOSPITAL_COMMUNITY): Payer: Self-pay | Admitting: *Deleted

## 2021-10-12 DIAGNOSIS — C652 Malignant neoplasm of left renal pelvis: Secondary | ICD-10-CM

## 2021-10-12 DIAGNOSIS — C7910 Secondary malignant neoplasm of unspecified urinary organs: Secondary | ICD-10-CM

## 2021-10-12 MED ORDER — OXYCODONE HCL 15 MG PO TABA: 15.0000 mg | ORAL_TABLET | Freq: Three times a day (TID) | ORAL | 0 refills | Status: DC | PRN

## 2021-10-13 ENCOUNTER — Encounter (HOSPITAL_COMMUNITY): Payer: Self-pay | Admitting: *Deleted

## 2021-10-13 ENCOUNTER — Other Ambulatory Visit (HOSPITAL_COMMUNITY): Payer: Self-pay | Admitting: *Deleted

## 2021-10-13 ENCOUNTER — Emergency Department (HOSPITAL_COMMUNITY): Payer: Medicare Other

## 2021-10-13 ENCOUNTER — Other Ambulatory Visit: Payer: Self-pay

## 2021-10-13 ENCOUNTER — Inpatient Hospital Stay (HOSPITAL_COMMUNITY)
Admission: EM | Admit: 2021-10-13 | Discharge: 2021-10-19 | DRG: 917 | Disposition: A | Discharge status: Home / Self Care | Payer: Medicare Other | Attending: Family Medicine | Admitting: Family Medicine

## 2021-10-13 ENCOUNTER — Telehealth (HOSPITAL_COMMUNITY): Payer: Self-pay | Admitting: *Deleted

## 2021-10-13 DIAGNOSIS — E46 Unspecified protein-calorie malnutrition: Secondary | ICD-10-CM | POA: Diagnosis not present

## 2021-10-13 DIAGNOSIS — E860 Dehydration: Secondary | ICD-10-CM | POA: Diagnosis present

## 2021-10-13 DIAGNOSIS — J9601 Acute respiratory failure with hypoxia: Secondary | ICD-10-CM | POA: Diagnosis not present

## 2021-10-13 DIAGNOSIS — Z9109 Other allergy status, other than to drugs and biological substances: Secondary | ICD-10-CM

## 2021-10-13 DIAGNOSIS — Z781 Physical restraint status: Secondary | ICD-10-CM | POA: Diagnosis not present

## 2021-10-13 DIAGNOSIS — K219 Gastro-esophageal reflux disease without esophagitis: Secondary | ICD-10-CM | POA: Diagnosis present

## 2021-10-13 DIAGNOSIS — E8809 Other disorders of plasma-protein metabolism, not elsewhere classified: Secondary | ICD-10-CM | POA: Diagnosis not present

## 2021-10-13 DIAGNOSIS — R4182 Altered mental status, unspecified: Secondary | ICD-10-CM | POA: Diagnosis present

## 2021-10-13 DIAGNOSIS — C799 Secondary malignant neoplasm of unspecified site: Secondary | ICD-10-CM | POA: Diagnosis not present

## 2021-10-13 DIAGNOSIS — Z87898 Personal history of other specified conditions: Secondary | ICD-10-CM

## 2021-10-13 DIAGNOSIS — D61810 Antineoplastic chemotherapy induced pancytopenia: Secondary | ICD-10-CM | POA: Diagnosis present

## 2021-10-13 DIAGNOSIS — R410 Disorientation, unspecified: Principal | ICD-10-CM

## 2021-10-13 DIAGNOSIS — T402X1A Poisoning by other opioids, accidental (unintentional), initial encounter: Principal | ICD-10-CM | POA: Diagnosis present

## 2021-10-13 DIAGNOSIS — G893 Neoplasm related pain (acute) (chronic): Secondary | ICD-10-CM

## 2021-10-13 DIAGNOSIS — Z6821 Body mass index (BMI) 21.0-21.9, adult: Secondary | ICD-10-CM

## 2021-10-13 DIAGNOSIS — Z79899 Other long term (current) drug therapy: Secondary | ICD-10-CM

## 2021-10-13 DIAGNOSIS — R0789 Other chest pain: Secondary | ICD-10-CM | POA: Diagnosis not present

## 2021-10-13 DIAGNOSIS — F17210 Nicotine dependence, cigarettes, uncomplicated: Secondary | ICD-10-CM | POA: Diagnosis not present

## 2021-10-13 DIAGNOSIS — E440 Moderate protein-calorie malnutrition: Secondary | ICD-10-CM | POA: Diagnosis not present

## 2021-10-13 DIAGNOSIS — E871 Hypo-osmolality and hyponatremia: Secondary | ICD-10-CM | POA: Diagnosis not present

## 2021-10-13 DIAGNOSIS — Z95828 Presence of other vascular implants and grafts: Secondary | ICD-10-CM

## 2021-10-13 DIAGNOSIS — G928 Other toxic encephalopathy: Secondary | ICD-10-CM | POA: Diagnosis present

## 2021-10-13 DIAGNOSIS — C7910 Secondary malignant neoplasm of unspecified urinary organs: Secondary | ICD-10-CM

## 2021-10-13 DIAGNOSIS — F1010 Alcohol abuse, uncomplicated: Secondary | ICD-10-CM | POA: Diagnosis present

## 2021-10-13 DIAGNOSIS — T424X1A Poisoning by benzodiazepines, accidental (unintentional), initial encounter: Secondary | ICD-10-CM | POA: Diagnosis not present

## 2021-10-13 DIAGNOSIS — C680 Malignant neoplasm of urethra: Secondary | ICD-10-CM | POA: Diagnosis not present

## 2021-10-13 DIAGNOSIS — T451X5A Adverse effect of antineoplastic and immunosuppressive drugs, initial encounter: Secondary | ICD-10-CM | POA: Diagnosis not present

## 2021-10-13 DIAGNOSIS — T424X5A Adverse effect of benzodiazepines, initial encounter: Secondary | ICD-10-CM | POA: Diagnosis not present

## 2021-10-13 DIAGNOSIS — F1190 Opioid use, unspecified, uncomplicated: Secondary | ICD-10-CM

## 2021-10-13 DIAGNOSIS — N179 Acute kidney failure, unspecified: Secondary | ICD-10-CM | POA: Diagnosis present

## 2021-10-13 DIAGNOSIS — C652 Malignant neoplasm of left renal pelvis: Secondary | ICD-10-CM

## 2021-10-13 DIAGNOSIS — D61818 Other pancytopenia: Secondary | ICD-10-CM

## 2021-10-13 DIAGNOSIS — I10 Essential (primary) hypertension: Secondary | ICD-10-CM | POA: Diagnosis present

## 2021-10-13 LAB — COMPREHENSIVE METABOLIC PANEL
ALT: 32 U/L (ref 0–44)
AST: 24 U/L (ref 15–41)
Albumin: 2.5 g/dL — ABNORMAL LOW (ref 3.5–5.0)
Alkaline Phosphatase: 117 U/L (ref 38–126)
Anion gap: 8 (ref 5–15)
BUN: 28 mg/dL — ABNORMAL HIGH (ref 8–23)
CO2: 18 mmol/L — ABNORMAL LOW (ref 22–32)
Calcium: 8.3 mg/dL — ABNORMAL LOW (ref 8.9–10.3)
Chloride: 105 mmol/L (ref 98–111)
Creatinine, Ser: 1.58 mg/dL — ABNORMAL HIGH (ref 0.61–1.24)
GFR, Estimated: 47 mL/min — ABNORMAL LOW (ref >60)
Glucose, Bld: 97 mg/dL (ref 70–99)
Potassium: 4.3 mmol/L (ref 3.5–5.1)
Sodium: 131 mmol/L — ABNORMAL LOW (ref 135–145)
Total Bilirubin: 1.4 mg/dL — ABNORMAL HIGH (ref 0.3–1.2)
Total Protein: 6.5 g/dL (ref 6.5–8.1)

## 2021-10-13 LAB — URINALYSIS, ROUTINE W REFLEX MICROSCOPIC
Bilirubin Urine: NEGATIVE
Glucose, UA: NEGATIVE mg/dL
Ketones, ur: 5 mg/dL — AB
Leukocytes,Ua: NEGATIVE
Nitrite: NEGATIVE
Protein, ur: 30 mg/dL — AB
Specific Gravity, Urine: 1.017 (ref 1.005–1.030)
pH: 5 (ref 5.0–8.0)

## 2021-10-13 LAB — CBC WITH DIFFERENTIAL/PLATELET
Abs Immature Granulocytes: 0.02 10*3/uL (ref 0.00–0.07)
Basophils Absolute: 0 10*3/uL (ref 0.0–0.1)
Basophils Relative: 0 %
Eosinophils Absolute: 0 10*3/uL (ref 0.0–0.5)
Eosinophils Relative: 0 %
HCT: 24.2 % — ABNORMAL LOW (ref 39.0–52.0)
Hemoglobin: 8.1 g/dL — ABNORMAL LOW (ref 13.0–17.0)
Immature Granulocytes: 1 %
Lymphocytes Relative: 24 %
Lymphs Abs: 0.8 10*3/uL (ref 0.7–4.0)
MCH: 29.2 pg (ref 26.0–34.0)
MCHC: 33.5 g/dL (ref 30.0–36.0)
MCV: 87.4 fL (ref 80.0–100.0)
Monocytes Absolute: 0.6 10*3/uL (ref 0.1–1.0)
Monocytes Relative: 17 %
Neutro Abs: 1.9 10*3/uL (ref 1.7–7.7)
Neutrophils Relative %: 58 %
Platelets: 59 10*3/uL — ABNORMAL LOW (ref 150–400)
RBC: 2.77 MIL/uL — ABNORMAL LOW (ref 4.22–5.81)
RDW: 13.2 % (ref 11.5–15.5)
WBC: 3.3 10*3/uL — ABNORMAL LOW (ref 4.0–10.5)
nRBC: 0 % (ref 0.0–0.2)

## 2021-10-13 LAB — BLOOD GAS, VENOUS
Acid-base deficit: 6.1 mmol/L — ABNORMAL HIGH (ref 0.0–2.0)
Bicarbonate: 17.5 mmol/L — ABNORMAL LOW (ref 20.0–28.0)
Drawn by: 61519
FIO2: 21 %
O2 Saturation: 88.7 %
Patient temperature: 36.8
pCO2, Ven: 27 mmHg — ABNORMAL LOW (ref 44–60)
pH, Ven: 7.42 (ref 7.25–7.43)
pO2, Ven: 56 mmHg — ABNORMAL HIGH (ref 32–45)

## 2021-10-13 LAB — RAPID URINE DRUG SCREEN, HOSP PERFORMED
Amphetamines: NOT DETECTED
Barbiturates: NOT DETECTED
Benzodiazepines: POSITIVE — AB
Cocaine: NOT DETECTED
Opiates: POSITIVE — AB
Tetrahydrocannabinol: NOT DETECTED

## 2021-10-13 LAB — ETHANOL: Alcohol, Ethyl (B): <10 mg/dL (ref <10)

## 2021-10-13 LAB — LIPASE, BLOOD: Lipase: 20 U/L (ref 11–51)

## 2021-10-13 MED ORDER — SODIUM CHLORIDE 0.9 % IV BOLUS
1000.0000 mL | Freq: Once | INTRAVENOUS | Status: AC
  Administered 2021-10-13: 1000 mL via INTRAVENOUS

## 2021-10-13 MED ORDER — OXYCODONE HCL 15 MG PO TABA: 15.0000 mg | ORAL_TABLET | Freq: Three times a day (TID) | ORAL | 0 refills | Status: DC | PRN

## 2021-10-13 MED ORDER — ALPRAZOLAM 0.5 MG PO TABS
1.0000 mg | ORAL_TABLET | Freq: Once | ORAL | Status: AC
  Administered 2021-10-13: 1 mg via ORAL
  Filled 2021-10-13: qty 2

## 2021-10-13 MED ORDER — OXYCODONE HCL 5 MG PO TABS
15.0000 mg | ORAL_TABLET | Freq: Once | ORAL | Status: AC
  Administered 2021-10-13: 15 mg via ORAL
  Filled 2021-10-13: qty 3

## 2021-10-13 MED ORDER — IBUPROFEN 800 MG PO TABS
800.0000 mg | ORAL_TABLET | Freq: Once | ORAL | Status: AC
  Administered 2021-10-13: 800 mg via ORAL
  Filled 2021-10-13: qty 1
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
## 2021-10-13 NOTE — ED Notes (Signed)
Pt refusing to be stuck by lab for blood work. States he has a port and wants it accessed, but only after numbing with topical lidocaine. Lidocaine gel applied, pt states to wait 30 minutes and then we can access his port.
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
## 2021-10-13 NOTE — ED Triage Notes (Signed)
Pt states, "I do not want any information given to [facility]."
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
## 2021-10-14 DIAGNOSIS — Z781 Physical restraint status: Secondary | ICD-10-CM | POA: Diagnosis not present

## 2021-10-14 DIAGNOSIS — K219 Gastro-esophageal reflux disease without esophagitis: Secondary | ICD-10-CM

## 2021-10-14 DIAGNOSIS — G928 Other toxic encephalopathy: Secondary | ICD-10-CM | POA: Diagnosis present

## 2021-10-14 DIAGNOSIS — E46 Unspecified protein-calorie malnutrition: Secondary | ICD-10-CM | POA: Diagnosis not present

## 2021-10-14 DIAGNOSIS — N179 Acute kidney failure, unspecified: Secondary | ICD-10-CM

## 2021-10-14 DIAGNOSIS — Z79899 Other long term (current) drug therapy: Secondary | ICD-10-CM | POA: Diagnosis not present

## 2021-10-14 DIAGNOSIS — T451X5A Adverse effect of antineoplastic and immunosuppressive drugs, initial encounter: Secondary | ICD-10-CM | POA: Diagnosis present

## 2021-10-14 DIAGNOSIS — E8809 Other disorders of plasma-protein metabolism, not elsewhere classified: Secondary | ICD-10-CM

## 2021-10-14 DIAGNOSIS — R4182 Altered mental status, unspecified: Secondary | ICD-10-CM | POA: Diagnosis not present

## 2021-10-14 DIAGNOSIS — Z6821 Body mass index (BMI) 21.0-21.9, adult: Secondary | ICD-10-CM | POA: Diagnosis not present

## 2021-10-14 DIAGNOSIS — E440 Moderate protein-calorie malnutrition: Secondary | ICD-10-CM | POA: Diagnosis not present

## 2021-10-14 DIAGNOSIS — E860 Dehydration: Secondary | ICD-10-CM | POA: Diagnosis present

## 2021-10-14 DIAGNOSIS — F17210 Nicotine dependence, cigarettes, uncomplicated: Secondary | ICD-10-CM | POA: Diagnosis present

## 2021-10-14 DIAGNOSIS — C680 Malignant neoplasm of urethra: Secondary | ICD-10-CM | POA: Diagnosis not present

## 2021-10-14 DIAGNOSIS — J9601 Acute respiratory failure with hypoxia: Secondary | ICD-10-CM | POA: Diagnosis not present

## 2021-10-14 DIAGNOSIS — T424X1A Poisoning by benzodiazepines, accidental (unintentional), initial encounter: Secondary | ICD-10-CM | POA: Diagnosis present

## 2021-10-14 DIAGNOSIS — F1010 Alcohol abuse, uncomplicated: Secondary | ICD-10-CM | POA: Diagnosis not present

## 2021-10-14 DIAGNOSIS — Z95828 Presence of other vascular implants and grafts: Secondary | ICD-10-CM | POA: Diagnosis not present

## 2021-10-14 DIAGNOSIS — D61818 Other pancytopenia: Secondary | ICD-10-CM

## 2021-10-14 DIAGNOSIS — D61810 Antineoplastic chemotherapy induced pancytopenia: Secondary | ICD-10-CM | POA: Diagnosis not present

## 2021-10-14 DIAGNOSIS — R410 Disorientation, unspecified: Secondary | ICD-10-CM | POA: Diagnosis present

## 2021-10-14 DIAGNOSIS — C799 Secondary malignant neoplasm of unspecified site: Secondary | ICD-10-CM | POA: Diagnosis not present

## 2021-10-14 DIAGNOSIS — E871 Hypo-osmolality and hyponatremia: Secondary | ICD-10-CM | POA: Diagnosis not present

## 2021-10-14 DIAGNOSIS — C7910 Secondary malignant neoplasm of unspecified urinary organs: Secondary | ICD-10-CM | POA: Diagnosis not present

## 2021-10-14 DIAGNOSIS — Z9109 Other allergy status, other than to drugs and biological substances: Secondary | ICD-10-CM | POA: Diagnosis not present

## 2021-10-14 DIAGNOSIS — I10 Essential (primary) hypertension: Secondary | ICD-10-CM | POA: Diagnosis present

## 2021-10-14 DIAGNOSIS — T402X1A Poisoning by other opioids, accidental (unintentional), initial encounter: Secondary | ICD-10-CM | POA: Diagnosis not present

## 2021-10-14 DIAGNOSIS — R0789 Other chest pain: Secondary | ICD-10-CM | POA: Diagnosis not present

## 2021-10-14 DIAGNOSIS — T424X5A Adverse effect of benzodiazepines, initial encounter: Secondary | ICD-10-CM | POA: Diagnosis not present

## 2021-10-14 LAB — CBC
HCT: 20 % — ABNORMAL LOW (ref 39.0–52.0)
Hemoglobin: 6.6 g/dL — CL (ref 13.0–17.0)
MCH: 29.3 pg (ref 26.0–34.0)
MCHC: 33 g/dL (ref 30.0–36.0)
MCV: 88.9 fL (ref 80.0–100.0)
Platelets: 47 10*3/uL — ABNORMAL LOW (ref 150–400)
RBC: 2.25 MIL/uL — ABNORMAL LOW (ref 4.22–5.81)
RDW: 13.2 % (ref 11.5–15.5)
WBC: 2.5 10*3/uL — ABNORMAL LOW (ref 4.0–10.5)
nRBC: 0 % (ref 0.0–0.2)

## 2021-10-14 LAB — PHOSPHORUS: Phosphorus: 2.7 mg/dL (ref 2.5–4.6)

## 2021-10-14 LAB — ABO/RH: ABO/RH(D): O POS

## 2021-10-14 LAB — VITAMIN B12: Vitamin B-12: 953 pg/mL — ABNORMAL HIGH (ref 180–914)

## 2021-10-14 LAB — COMPREHENSIVE METABOLIC PANEL
ALT: 25 U/L (ref 0–44)
AST: 19 U/L (ref 15–41)
Albumin: 2 g/dL — ABNORMAL LOW (ref 3.5–5.0)
Alkaline Phosphatase: 95 U/L (ref 38–126)
Anion gap: 5 (ref 5–15)
BUN: 27 mg/dL — ABNORMAL HIGH (ref 8–23)
CO2: 17 mmol/L — ABNORMAL LOW (ref 22–32)
Calcium: 7.6 mg/dL — ABNORMAL LOW (ref 8.9–10.3)
Chloride: 110 mmol/L (ref 98–111)
Creatinine, Ser: 1.46 mg/dL — ABNORMAL HIGH (ref 0.61–1.24)
GFR, Estimated: 51 mL/min — ABNORMAL LOW (ref >60)
Glucose, Bld: 88 mg/dL (ref 70–99)
Potassium: 4.2 mmol/L (ref 3.5–5.1)
Sodium: 132 mmol/L — ABNORMAL LOW (ref 135–145)
Total Bilirubin: 0.8 mg/dL (ref 0.3–1.2)
Total Protein: 5.2 g/dL — ABNORMAL LOW (ref 6.5–8.1)

## 2021-10-14 LAB — PREPARE RBC (CROSSMATCH)

## 2021-10-14 LAB — FOLATE: Folate: 6.1 ng/mL (ref >5.9)

## 2021-10-14 LAB — HEMOGLOBIN AND HEMATOCRIT, BLOOD
HCT: 20.7 % — ABNORMAL LOW (ref 39.0–52.0)
Hemoglobin: 7 g/dL — ABNORMAL LOW (ref 13.0–17.0)

## 2021-10-14 LAB — HIV ANTIBODY (ROUTINE TESTING W REFLEX): HIV Screen 4th Generation wRfx: NONREACTIVE

## 2021-10-14 LAB — IRON AND TIBC
Iron: 22 ug/dL — ABNORMAL LOW (ref 45–182)
Saturation Ratios: 14 % — ABNORMAL LOW (ref 17.9–39.5)
TIBC: 152 ug/dL — ABNORMAL LOW (ref 250–450)
UIBC: 130 ug/dL

## 2021-10-14 LAB — MAGNESIUM: Magnesium: 1.8 mg/dL (ref 1.7–2.4)

## 2021-10-14 MED ORDER — OXYCODONE HCL 5 MG PO TABS
15.0000 mg | ORAL_TABLET | Freq: Three times a day (TID) | ORAL | Status: DC | PRN
  Administered 2021-10-14 (×2): 15 mg via ORAL
  Filled 2021-10-14 (×4): qty 3

## 2021-10-14 MED ORDER — SODIUM CHLORIDE 0.9% IV SOLUTION: Freq: Once | INTRAVENOUS | Status: DC

## 2021-10-14 MED ORDER — SODIUM CHLORIDE 0.9 % IV SOLN: INTRAVENOUS | Status: AC

## 2021-10-14 MED ORDER — CHLORHEXIDINE GLUCONATE CLOTH 2 % EX PADS
6.0000 | MEDICATED_PAD | Freq: Every day | CUTANEOUS | Status: DC
  Administered 2021-10-14 – 2021-10-19 (×7): 6 via TOPICAL

## 2021-10-14 MED ORDER — ENSURE ENLIVE PO LIQD
237.0000 mL | Freq: Two times a day (BID) | ORAL | Status: DC
  Administered 2021-10-14 – 2021-10-19 (×6): 237 mL via ORAL

## 2021-10-14 MED ORDER — PANTOPRAZOLE SODIUM 40 MG PO TBEC: 40.0000 mg | DELAYED_RELEASE_TABLET | Freq: Every day | ORAL | Status: DC

## 2021-10-14 MED ORDER — PANTOPRAZOLE SODIUM 40 MG PO TBEC
40.0000 mg | DELAYED_RELEASE_TABLET | Freq: Every day | ORAL | Status: DC
  Administered 2021-10-14 – 2021-10-19 (×4): 40 mg via ORAL
  Filled 2021-10-14 (×5): qty 1

## 2021-10-14 MED ORDER — HALOPERIDOL LACTATE 5 MG/ML IJ SOLN
2.0000 mg | Freq: Four times a day (QID) | INTRAMUSCULAR | Status: DC | PRN
  Administered 2021-10-14 – 2021-10-15 (×3): 2 mg via INTRAVENOUS
  Filled 2021-10-14 (×4): qty 1

## 2021-10-14 MED ORDER — LORAZEPAM 2 MG/ML IJ SOLN
1.0000 mg | Freq: Four times a day (QID) | INTRAMUSCULAR | Status: DC | PRN
  Administered 2021-10-15 – 2021-10-19 (×6): 1 mg via INTRAVENOUS
  Filled 2021-10-14 (×6): qty 1

## 2021-10-14 MED ORDER — SODIUM CHLORIDE 0.9 % IV BOLUS
500.0000 mL | Freq: Once | INTRAVENOUS | Status: AC
  Administered 2021-10-14: 500 mL via INTRAVENOUS

## 2021-10-14 MED ORDER — ALPRAZOLAM 1 MG PO TABS
1.0000 mg | ORAL_TABLET | Freq: Two times a day (BID) | ORAL | Status: DC
  Administered 2021-10-14 (×2): 1 mg via ORAL
  Filled 2021-10-14 (×3): qty 1

## 2021-10-14 MED ORDER — QUETIAPINE FUMARATE 25 MG PO TABS
25.0000 mg | ORAL_TABLET | Freq: Every day | ORAL | Status: DC
  Administered 2021-10-14: 25 mg via ORAL
  Filled 2021-10-14: qty 1

## 2021-10-14 NOTE — Progress Notes (Signed)
Adult protective services contacted on behalf of patient and patient's family.

## 2021-10-14 NOTE — Progress Notes (Signed)
Patient spoken with regarding IVC by sheriff officer and two security guards, patient verbally expressed understanding of status, but continues to show agitation, PRN given with effectiveness noted within 15 minutes. 1:1 safety attendant present at bedside, call bell bell and water within reach of patient.

## 2021-10-14 NOTE — Progress Notes (Signed)
    71 y.o. male with medical history significant of metastatic urothelial carcinoma (follows with Dr. Delton Coombes), GERD, hypertension admitted on 10/14/21 with pancytopenia and dehydration with AKI resulting in acute metabolic encephalopathy   I was Called to eval pt due to agitation/confusion and uncooperative behavior -Patient is agitated, restless, not very cooperative, yelling and screaming at his daughter and the Nursing staff - Hawkins Seaman is  a  71 y.o.  who is confused and disoriented with delusion/psychosis who is unable to understand (without significant language barrier) his current medical diagnosis, patient also is unable to properly verbalizes understanding of proposed treatment options including option of no treatment, patient does not seem to understand the consequences/risk versus benefit of each treatment option, alternatives as well as the option of no treatment -. Based on my evaluation Caldwell Kronenberger  appears to have LACK the Capacity to make decisions and give informed consent about hismedical care.  A surrogate decision-maker is required as Quamir Willemsen  appears to NOT have capacity to make his own decisions and give informed consent regarding his medical care  Pt's biological daughter Ms Delroy Ordway is at bedside and willing to step in and make decisions for his father - Discussed with Patient's attending provider Dr. Wyline Copas - Patient will need IVC papers to prevent him from [place].  Roxan Hockey, MD

## 2021-10-14 NOTE — Progress Notes (Signed)
Patient refused EKG.

## 2021-10-14 NOTE — Hospital Course (Signed)
71 y.o. male with medical history significant of metastatic urothelial carcinoma (follows with Dr. Delton Coombes), GERD, hypertension who presents to the emergency department due to confusion. Concerns of dehydration with arf and possible medication overdose.

## 2021-10-14 NOTE — Progress Notes (Addendum)
   10/14/21 5800  Provider Notification  Provider Name/Title [facility]  Date Provider Notified 10/14/21  Time Provider Notified [phone]  Method of Notification  (secure chat)  Notification Reason Critical result  Test performed and critical result Hemoglobin 6.6  Date Critical Result Received 10/14/21  Time Critical Result Received 0622   Dr. Adriana Simas put in order to redraw H/H

## 2021-10-14 NOTE — Progress Notes (Signed)
Patient pulled tele monitor off and would not allow staff to place back on patient. Patient refused to allow staff to recheck vital signs after patient completed blood transfusion. Patient stated NO I will not, NO means NO, don't you know what NO means. Daughter at bedside attempted to see if patient would allow her to get vitals, patient refused stated No he does not need them. MD Wyline Copas made aware. Per MD no need to repeat H&H, no new orders at this time.

## 2021-10-14 NOTE — Progress Notes (Signed)
Message sent to [facility] on secure chat. Pt and his daughter would like to speak with him in regards to power os attorney and living will [phone]
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
## 2021-10-14 NOTE — Progress Notes (Signed)
Patient requested to leave AMA, patient stated he was going to pull his port out and remove armbands. Patient stated he wants to be left alone and that he does not need anyone's help. MD Wyline Copas made aware.
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
## 2021-10-14 NOTE — Progress Notes (Signed)
Responding to consult re: Adv. Dir.   Met with pt's daughter at bedside.  Pt asleep, having received mediation for pain.   Provided Advance Directive and education around [place] and Living Will.  Daughter wishes to discuss with pt.  Understands to have nurse paged if spiritual care can assist in answering questions or discussing paperwork. Understands process for notarizing paperwork.   Will continue to follow for support around this Adv. Dir.
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
## 2021-10-15 DIAGNOSIS — C7910 Secondary malignant neoplasm of unspecified urinary organs: Secondary | ICD-10-CM | POA: Diagnosis not present

## 2021-10-15 DIAGNOSIS — N179 Acute kidney failure, unspecified: Secondary | ICD-10-CM | POA: Diagnosis not present

## 2021-10-15 DIAGNOSIS — R410 Disorientation, unspecified: Secondary | ICD-10-CM | POA: Diagnosis not present

## 2021-10-15 DIAGNOSIS — E8809 Other disorders of plasma-protein metabolism, not elsewhere classified: Secondary | ICD-10-CM | POA: Diagnosis not present

## 2021-10-15 LAB — TYPE AND SCREEN
ABO/RH(D): O POS
Antibody Screen: NEGATIVE
Unit division: 0

## 2021-10-15 LAB — BPAM RBC
Blood Product Expiration Date: 202308102359
ISSUE DATE / TIME: 202307131405
Unit Type and Rh: 5100

## 2021-10-15 LAB — COMPREHENSIVE METABOLIC PANEL
ALT: 26 U/L (ref 0–44)
AST: 21 U/L (ref 15–41)
Albumin: 2.3 g/dL — ABNORMAL LOW (ref 3.5–5.0)
Alkaline Phosphatase: 108 U/L (ref 38–126)
Anion gap: 7 (ref 5–15)
BUN: 15 mg/dL (ref 8–23)
CO2: 20 mmol/L — ABNORMAL LOW (ref 22–32)
Calcium: 8.3 mg/dL — ABNORMAL LOW (ref 8.9–10.3)
Chloride: 107 mmol/L (ref 98–111)
Creatinine, Ser: 1.09 mg/dL (ref 0.61–1.24)
GFR, Estimated: >60 mL/min (ref >60)
Glucose, Bld: 80 mg/dL (ref 70–99)
Potassium: 4.3 mmol/L (ref 3.5–5.1)
Sodium: 134 mmol/L — ABNORMAL LOW (ref 135–145)
Total Bilirubin: 1.2 mg/dL (ref 0.3–1.2)
Total Protein: 5.8 g/dL — ABNORMAL LOW (ref 6.5–8.1)

## 2021-10-15 LAB — CBC
HCT: 26.5 % — ABNORMAL LOW (ref 39.0–52.0)
Hemoglobin: 8.7 g/dL — ABNORMAL LOW (ref 13.0–17.0)
MCH: 29 pg (ref 26.0–34.0)
MCHC: 32.8 g/dL (ref 30.0–36.0)
MCV: 88.3 fL (ref 80.0–100.0)
Platelets: 77 10*3/uL — ABNORMAL LOW (ref 150–400)
RBC: 3 MIL/uL — ABNORMAL LOW (ref 4.22–5.81)
RDW: 13.3 % (ref 11.5–15.5)
WBC: 3.4 10*3/uL — ABNORMAL LOW (ref 4.0–10.5)
nRBC: 0 % (ref 0.0–0.2)

## 2021-10-15 MED ORDER — DEXTROSE-NACL 5-0.9 % IV SOLN: INTRAVENOUS | Status: DC

## 2021-10-15 MED ORDER — OXYCODONE HCL 5 MG PO TABS
5.0000 mg | ORAL_TABLET | Freq: Three times a day (TID) | ORAL | Status: DC | PRN
  Administered 2021-10-17 – 2021-10-19 (×5): 5 mg via ORAL
  Filled 2021-10-15 (×5): qty 1

## 2021-10-15 MED ORDER — LORAZEPAM 2 MG/ML IJ SOLN
2.0000 mg | Freq: Once | INTRAMUSCULAR | Status: AC
  Administered 2021-10-15: 2 mg via INTRAVENOUS
  Filled 2021-10-15: qty 1

## 2021-10-15 MED ORDER — HALOPERIDOL LACTATE 5 MG/ML IJ SOLN
5.0000 mg | Freq: Three times a day (TID) | INTRAMUSCULAR | Status: DC
  Administered 2021-10-15 – 2021-10-16 (×4): 5 mg via INTRAMUSCULAR
  Filled 2021-10-15 (×3): qty 1
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
## 2021-10-15 NOTE — Progress Notes (Signed)
Pt resting quietly at present, sleeps off and on in short spurts. Pulls at wrist restraints when awake, cooperative though at this time and ankle restraints removed. Right wrist restraint removed for ROJM and pt began swinging arm at staff, kicking footboard of bed, screaming, "I will kill you fucking bitch!", Yelling, "[place]arene [place]! Larene [place]! Where the fuck are you!" Unable to orient pt despite multiple attempts. Wrist restraint reapplied, ankle restraint reapplied for safety. MD Courage, [facility] Tawnya Crook, and Assistant AD Donavan Foil notified of current pt condition and continued need for 4 point restraints.

## 2021-10-15 NOTE — Progress Notes (Signed)
-  Patient remains confused , verbally abusive and agitated from time to time restless -Staff reported episodes of patient attempting to strike or kick staff members -I evaluated patient several times today... - I personally did not witness any episode of physical violence -Patient was verbally abusive, restless and at times agitated when I saw him -Overall pt has been responding well to Haldol and lorazepam--- a lot more manageable with these medications on board - -Currently pt does Not meet criteria for violent restraints at this time,.. --. Will de-escalate back to nonviolent restraints -Patient bilateral ankle restraints have been removed for over a couple of hours now he has done well -Soft wrist restraints remains -We will continue to reevaluate from time to time to determine what level of restraints if any patient may need from time to time  Roxan Hockey, MD

## 2021-10-15 NOTE — Progress Notes (Signed)
Order received to move pt to ICU for closer observation due to behavior and restraints. Updated pt's daughter [place] via phone of transfer and she states understanding.  Pt is drowsy but arouses to name called and to movement in the room. Pt continues to yell and/or mumble profanity with swinging of arms and kicking at staff despite use of soft wrist restraints when touched or spoken to. Pt transported via bed to ICU room 5 per MD order. Daughter at bedside.
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
## 2021-10-15 NOTE — Plan of Care (Signed)

## 2021-10-15 NOTE — Progress Notes (Signed)
Vitals obtained on pt. Py lying in bed, Pulling at restraints and kicking legs currently yelling and making threats to nursing staff. O2 reading 72% on RA.  This LPN applied nasal cannula with Oxygen at 2L , current reading 100 %. Will continue to monitor
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
## 2021-10-15 NOTE — BH Assessment (Signed)
TTS attempted to see will follow up with Pt's nurse. 
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
## 2021-10-15 NOTE — Progress Notes (Signed)
Pt given Ativan '1mg'$  IV per order. Pt swinging at staff, cursing, trying to kick and bite staff when this nurse approached bed and explained planned med admin. Required 4 staff members to briefly hold pt to get access to his port a cath for administration. Pt currently sleeping post admin, restraints removed from legs at this time and will continue to reevaluate pt's need for restraints.

## 2021-10-15 NOTE — Progress Notes (Signed)
Pt continues to yell intermittently, cursing, pulling at restraints, attempting to hit and kick staff. Unable to reorient despite multiple attempts by multiple staff members. MD Courage in earlier to assess pt, pt cursed MD and told him to leave his room. Orders received and Ativan '2mg'$  IV administered, required 2 staff members to briefly hold pt to admin.  Pt has been offered food and fluids multiple times, refuses. Offered urinal but pt aggressive to staff. Condom cath applied for toileting. Required 4 staff members for brief hold to apply condom cath with pt yelling and attempting to bite staff and kick them entire time.  Pt's daughter [place] called and updated on current pt condition and treatment plan. States understanding.

## 2021-10-15 NOTE — Progress Notes (Signed)
Pt dozing at this time but restless in bed, pulling at restraints and attempting to pull condom cath off. Soft mumbling conversation to self. Calm as long as no staff approaches him or touches him, as then he curses and threatens staff and attempts to kick and hit staff. Have attempted to give pt food and oral fluids but pt refuses. Resp even and non-labored. Skin warm and dry, color appropriate. Moving all extremities. No urine output noted in condom cath at this time.

## 2021-10-15 NOTE — Consult Note (Signed)
Attempted to see patient for psychiatric assessment for medication mgmt.  Per Leonette Most, patient recently received prn ativan, is asleep and cannot participate in assessment at this time.  She agrees to reach back to this Probation officer when patient is available.

## 2021-10-15 NOTE — Progress Notes (Signed)
Patient starting hitting and kicking @ 0310, pulled out chest port. Security called to restrain from hitting, chest port replaced patient medicated for agitation, physician called for orders for restraints to prevent self harm. Family notified of order for restraints. Patient is now calm and resting quietly.

## 2021-10-16 DIAGNOSIS — N179 Acute kidney failure, unspecified: Secondary | ICD-10-CM | POA: Diagnosis not present

## 2021-10-16 DIAGNOSIS — K219 Gastro-esophageal reflux disease without esophagitis: Secondary | ICD-10-CM | POA: Diagnosis not present

## 2021-10-16 DIAGNOSIS — R410 Disorientation, unspecified: Secondary | ICD-10-CM | POA: Diagnosis not present

## 2021-10-16 DIAGNOSIS — E860 Dehydration: Secondary | ICD-10-CM | POA: Diagnosis not present

## 2021-10-16 LAB — RENAL FUNCTION PANEL
Albumin: 2.4 g/dL — ABNORMAL LOW (ref 3.5–5.0)
Anion gap: 9 (ref 5–15)
BUN: 11 mg/dL (ref 8–23)
CO2: 18 mmol/L — ABNORMAL LOW (ref 22–32)
Calcium: 8.3 mg/dL — ABNORMAL LOW (ref 8.9–10.3)
Chloride: 108 mmol/L (ref 98–111)
Creatinine, Ser: 0.9 mg/dL (ref 0.61–1.24)
GFR, Estimated: >60 mL/min (ref >60)
Glucose, Bld: 96 mg/dL (ref 70–99)
Phosphorus: 2.5 mg/dL (ref 2.5–4.6)
Potassium: 4 mmol/L (ref 3.5–5.1)
Sodium: 135 mmol/L (ref 135–145)

## 2021-10-16 LAB — GLUCOSE, CAPILLARY: Glucose-Capillary: 121 mg/dL — ABNORMAL HIGH (ref 70–99)

## 2021-10-16 MED ORDER — LORAZEPAM 0.5 MG PO TABS: 0.5000 mg | ORAL_TABLET | Freq: Two times a day (BID) | ORAL | Status: DC

## 2021-10-16 MED ORDER — NICOTINE 21 MG/24HR TD PT24
21.0000 mg | MEDICATED_PATCH | Freq: Every day | TRANSDERMAL | Status: DC
  Administered 2021-10-16 – 2021-10-19 (×4): 21 mg via TRANSDERMAL
  Filled 2021-10-16 (×4): qty 1

## 2021-10-16 MED ORDER — ESCITALOPRAM OXALATE 10 MG PO TABS
10.0000 mg | ORAL_TABLET | Freq: Every day | ORAL | Status: DC
  Administered 2021-10-16 – 2021-10-19 (×4): 10 mg via ORAL
  Filled 2021-10-16 (×5): qty 1

## 2021-10-16 MED ORDER — BENZTROPINE MESYLATE 1 MG PO TABS
0.5000 mg | ORAL_TABLET | Freq: Every day | ORAL | Status: DC
  Administered 2021-10-16 – 2021-10-19 (×4): 0.5 mg via ORAL
  Filled 2021-10-16 (×4): qty 1

## 2021-10-16 MED ORDER — BENZTROPINE MESYLATE 1 MG/ML IJ SOLN
0.5000 mg | Freq: Every day | INTRAMUSCULAR | Status: DC
  Filled 2021-10-16 (×5): qty 0.5

## 2021-10-16 MED ORDER — HALOPERIDOL LACTATE 5 MG/ML IJ SOLN
5.0000 mg | Freq: Once | INTRAMUSCULAR | Status: AC
  Administered 2021-10-16: 5 mg via INTRAMUSCULAR
  Filled 2021-10-16: qty 1

## 2021-10-16 MED ORDER — LORAZEPAM 2 MG/ML IJ SOLN
0.5000 mg | Freq: Two times a day (BID) | INTRAMUSCULAR | Status: DC
  Administered 2021-10-16: 0.5 mg via INTRAVENOUS
  Filled 2021-10-16: qty 1

## 2021-10-16 MED ORDER — LORAZEPAM 2 MG/ML IJ SOLN: 0.5000 mg | Freq: Two times a day (BID) | INTRAMUSCULAR | Status: DC

## 2021-10-16 MED ORDER — LORAZEPAM 0.5 MG PO TABS
0.5000 mg | ORAL_TABLET | Freq: Two times a day (BID) | ORAL | Status: DC
  Administered 2021-10-17: 0.5 mg via ORAL
  Filled 2021-10-16: qty 1

## 2021-10-16 MED ORDER — HALOPERIDOL LACTATE 5 MG/ML IJ SOLN
5.0000 mg | Freq: Every day | INTRAMUSCULAR | Status: DC
  Administered 2021-10-17: 5 mg via INTRAMUSCULAR
  Filled 2021-10-16: qty 1

## 2021-10-16 NOTE — Consult Note (Cosign Needed)
Telepsych Consultation   Reason for Consult:  altered mental status Referring Physician:  Donne Hazel, MD Location of Patient:  [facility] [phone] Location of Provider: [place] [facility]  Patient Identification: Juandavid Dallman MRN:  [id number] Principal Diagnosis: Altered mental status Diagnosis:  Principal Problem:   Altered mental status Active Problems:   Gastroesophageal reflux disease   Primary hypertension   Metastatic urothelial carcinoma ([place])   Dehydration   AKI (acute kidney injury) ([name])   Pancytopenia ([place])   Hypoalbuminemia due to protein-calorie malnutrition ([place])   Toxic metabolic encephalopathy   Total Time spent with patient: 20 minutes  Subjective:   Jeovany Huitron is a 71 y.o. male patient admitted with confusion.  Patient presents disoriented and ill. Eyes closed with no direct eye contact. States he is currently in "hospital in [place], [place]". "I guess silliness" as reason for admission. "Feeling great. Today is the 19th of November 3038". Unable to fully engage in assessment due to current state; daughter and RN remained at bedside to assist pt with assessment. He denies any thoughts of wanting to harm himself or anyone else, auditory or visual hallucinations. He is easily agitated, however in control.    Collateral: [place] (daughter) at bedside.   HPI:  Clance Baquero is a 71 year old male patient with past psychiatric history of toxic metabolic encephalopathy, altered mental status who presented to [facility] 10/13/21 for altered mental status. Recent medical history of metastatic urothelial cancer and actively receiving chemotherapy; last tx 10/07/21. Per chart review, pt's daughter contacted Oncologist with complaints of patient being incoherent and agitated 10/13/21. UDS-, BAL<10.   Past Psychiatric History: toxic metabolic encephalopathy, altered mental status  Risk to Self:   Risk to Others:   Prior Inpatient Therapy:    Prior Outpatient Therapy:    Past Medical History:  Past Medical History:  Diagnosis Date   Chronic kidney disease    GERD (gastroesophageal reflux disease)    Hypertension    Medical history non-contributory     Past Surgical History:  Procedure Laterality Date   appendectomy     PORTACATH PLACEMENT Right 05/26/2021   Procedure: INSERTION PORT-A-CATH- IJ;  Surgeon: Rusty Aus, DO;  Location: [facility];  Service: General;  Laterality: Right;  Internal Jugular    Family History:  Family History  Problem Relation Age of Onset   Colon cancer Neg Hx    Colon polyps Neg Hx    Liver cancer Neg Hx    Pancreatic cancer Neg Hx    Family Psychiatric  History: not noted Social History:  Social History   Substance and Sexual Activity  Alcohol Use Yes   Comment: occ     Social History   Substance and Sexual Activity  Drug Use Yes    Social History   Socioeconomic History   Marital status: Single    Spouse name: Not on file   Number of children: Not on file   Years of education: Not on file   Highest education level: Not on file  Occupational History   Not on file  Tobacco Use   Smoking status: Some Days   Smokeless tobacco: Current   Tobacco comments:    a few cigarettes a day  Vaping Use   Vaping Use: Never used  Substance and Sexual Activity   Alcohol use: Yes    Comment: occ   Drug use: Yes   Sexual activity: Not on file  Other Topics Concern   Not on file  Social History Narrative   Not on file   Social Determinants of Health   Financial Resource Strain: Low Risk  (11/09/2020)   Overall Financial Resource Strain (CARDIA)    Difficulty of Paying Living Expenses: Not hard at all  Food Insecurity: No Food Insecurity (11/09/2020)   Hunger Vital Sign    Worried About Running Out of Food in the Last Year: Never true    Ran Out of Food in the Last Year: Never true  Transportation Needs: No Transportation Needs (11/09/2020)   PRAPARE - Armed forces logistics/support/administrative officer (Medical): No    Lack of Transportation (Non-Medical): No  Physical Activity: Unknown (11/09/2020)   Exercise Vital Sign    Days of Exercise per Week: 5 days    Minutes of Exercise per Session: Not on file  Stress: No Stress Concern Present (11/09/2020)   [place]    Feeling of Stress : Not at all  Social Connections: Moderately Isolated (11/09/2020)   Social Connection and Isolation Panel [NHANES]    Frequency of Communication with Friends and Family: More than three times a week    Frequency of Social Gatherings with Friends and Family: More than three times a week    Attends Religious Services: More than 4 times per year    Active Member of [employer] or Organizations: No    Attends Archivist Meetings: Never    Marital Status: Divorced   Additional Social History:    Allergies:   Allergies  Allergen Reactions   Ivette Loyal [Sacituzumab Govitecan-Hziy] Other (See Comments)    See notes from 07/14/21 and 07/21/21        Labs:  Results for orders placed or performed during the hospital encounter of 10/13/21 (from the past 48 hour(s))  Comprehensive metabolic panel     Status: Abnormal   Collection Time: 10/15/21  5:39 AM  Result Value Ref Range   Sodium 134 (L) 135 - 145 mmol/L   Potassium 4.3 3.5 - 5.1 mmol/L   Chloride 107 98 - 111 mmol/L   CO2 20 (L) 22 - 32 mmol/L   Glucose, Bld 80 70 - 99 mg/dL    Comment: Glucose reference range applies only to samples taken after fasting for at least 8 hours.   BUN 15 8 - 23 mg/dL   Creatinine, Ser 1.09 0.61 - 1.24 mg/dL   Calcium 8.3 (L) 8.9 - 10.3 mg/dL   Total Protein 5.8 (L) 6.5 - 8.1 g/dL   Albumin 2.3 (L) 3.5 - 5.0 g/dL   AST 21 15 - 41 U/L   ALT 26 0 - 44 U/L   Alkaline Phosphatase 108 38 - 126 U/L   Total Bilirubin 1.2 0.3 - 1.2 mg/dL   GFR, Estimated >60 >60 mL/min    Comment: (NOTE) Calculated using the CKD-EPI Creatinine Equation  (2021)    Anion gap 7 5 - 15    Comment: Performed at [facility], [address], [place], [place] [address]  CBC     Status: Abnormal   Collection Time: 10/15/21  5:39 AM  Result Value Ref Range   WBC 3.4 (L) 4.0 - 10.5 K/uL   RBC 3.00 (L) 4.22 - 5.81 MIL/uL   Hemoglobin 8.7 (L) 13.0 - 17.0 g/dL   HCT 26.5 (L) 39.0 - 52.0 %   MCV 88.3 80.0 - 100.0 fL   MCH 29.0 26.0 - 34.0 pg   MCHC 32.8 30.0 - 36.0  g/dL   RDW 13.3 11.5 - 15.5 %   Platelets 77 (L) 150 - 400 K/uL    Comment: SPECIMEN CHECKED FOR CLOTS Immature Platelet Fraction may be clinically indicated, consider ordering this additional test [id number] CONSISTENT WITH PREVIOUS RESULT REPEATED TO VERIFY    nRBC 0.0 0.0 - 0.2 %    Comment: Performed at [facility], [address], [place], [place] [address]  Renal function panel     Status: Abnormal   Collection Time: 10/16/21  4:27 AM  Result Value Ref Range   Sodium 135 135 - 145 mmol/L   Potassium 4.0 3.5 - 5.1 mmol/L   Chloride 108 98 - 111 mmol/L   CO2 18 (L) 22 - 32 mmol/L   Glucose, Bld 96 70 - 99 mg/dL    Comment: Glucose reference range applies only to samples taken after fasting for at least 8 hours.   BUN 11 8 - 23 mg/dL   Creatinine, Ser 0.90 0.61 - 1.24 mg/dL   Calcium 8.3 (L) 8.9 - 10.3 mg/dL   Phosphorus 2.5 2.5 - 4.6 mg/dL   Albumin 2.4 (L) 3.5 - 5.0 g/dL   GFR, Estimated >60 >60 mL/min    Comment: (NOTE) Calculated using the CKD-EPI Creatinine Equation (2021)    Anion gap 9 5 - 15    Comment: Performed at [facility], [address] [place], [place] [address]  Glucose, capillary     Status: Abnormal   Collection Time: 10/16/21 12:20 PM  Result Value Ref Range   Glucose-Capillary 121 (H) 70 - 99 mg/dL    Comment: Glucose reference range applies only to samples taken after fasting for at least 8 hours.    Medications:  Current Facility-Administered Medications  Medication Dose Route Frequency Provider Last Rate Last Admin   0.9 %  sodium  chloride infusion (Manually program via Guardrails IV Fluids)   Intravenous Once Donne Hazel, MD       benztropine (COGENTIN) tablet 0.5 mg  0.5 mg Oral Daily Leevy-Johnson, Sundae Maners A, NP       Or   benztropine mesylate (COGENTIN) injection 0.5 mg  0.5 mg Intramuscular Daily Leevy-Johnson, Adrine Hayworth A, NP       Chlorhexidine Gluconate Cloth 2 % PADS 6 each  6 each Topical Daily Adefeso, Oladapo, DO   6 each at 10/16/21 0930   dextrose 5 %-0.9 % sodium chloride infusion   Intravenous Continuous Emokpae, Courage, MD 75 mL/hr at 10/16/21 1634 Infusion Verify at 10/16/21 1634   escitalopram (LEXAPRO) tablet 10 mg  10 mg Oral Daily Leevy-Johnson, Jaasia Viglione A, NP       feeding supplement (ENSURE ENLIVE / ENSURE PLUS) liquid 237 mL  237 mL Oral BID BM Adefeso, Oladapo, DO   237 mL at 10/14/21 0836   [START ON 10/17/2021] haloperidol lactate (HALDOL) injection 5 mg  5 mg Intramuscular Daily Leevy-Johnson, Siennah Barrasso A, NP       LORazepam (ATIVAN) tablet 0.5 mg  0.5 mg Oral BID Leevy-Johnson, Lavonia Eager A, NP       Or   LORazepam (ATIVAN) injection 0.5 mg  0.5 mg Intravenous BID Leevy-Johnson, Tjay Velazquez A, NP       LORazepam (ATIVAN) injection 1 mg  1 mg Intravenous Q6H PRN Emokpae, Courage, MD   1 mg at 10/16/21 0932   nicotine (NICODERM CQ - dosed in mg/24 hours) patch 21 mg  21 mg Transdermal Daily Emokpae, Courage, MD       oxyCODONE (Oxy IR/ROXICODONE) immediate release tablet  5 mg  5 mg Oral Q8H PRN Donne Hazel, MD       pantoprazole (PROTONIX) EC tablet 40 mg  40 mg Oral Daily Adefeso, Oladapo, DO   40 mg at 10/14/21 3716   Facility-Administered Medications Ordered in Other Encounters  Medication Dose Route Frequency Provider Last Rate Last Admin   acetaminophen (TYLENOL) 325 MG tablet            famotidine (PEPCID) 20-0.9 MG/50ML-% IVPB            palonosetron (ALOXI) 0.25 MG/5ML injection            sodium chloride flush (NS) 0.9 % injection 10 mL  10 mL Intravenous PRN Derek Jack, MD   10 mL at  08/04/21 1030    Musculoskeletal: Strength & Muscle Tone: decreased Gait & Station: unable to stand Patient leans: Backward  Psychiatric Specialty Exam:  Presentation  General Appearance: Disheveled Eye Contact:Fleeting Speech:Slow Speech Volume:Decreased Handedness:No data recorded  Mood and Affect  Mood:Euthymic Affect:Inappropriate  Thought Process  Thought Processes:Disorganized Descriptions of Associations:Loose  Orientation:None  Thought Content:Illogical  History of Schizophrenia/Schizoaffective disorder:No data recorded Duration of Psychotic Symptoms:No data recorded Hallucinations:Hallucinations: None  Ideas of Reference:None  Suicidal Thoughts:Suicidal Thoughts: No  Homicidal Thoughts:Homicidal Thoughts: No   Sensorium  Memory:Immediate Poor; Recent Poor; Remote Poor Judgment:Impaired Insight:Poor  Executive Functions  Concentration:Poor Attention Span:Poor Recall:Poor Fund of Knowledge:Poor Language:Poor  Psychomotor Activity  Psychomotor Activity:Psychomotor Activity: Normal  Assets  Assets:Resilience; Social Support  Sleep  Sleep:Sleep: Fair   Physical Exam: Physical Exam Vitals and nursing note reviewed.  Constitutional:      Appearance: He is not ill-appearing.  HENT:     Head: Normocephalic.     Nose: Nose normal.     Mouth/Throat:     Mouth: Mucous membranes are moist.     Pharynx: Oropharynx is clear.  Eyes:     Pupils: Pupils are equal, round, and reactive to light.  Cardiovascular:     Rate and Rhythm: Normal rate.     Pulses: Normal pulses.  Pulmonary:     Effort: Pulmonary effort is normal.  Abdominal:     Palpations: Abdomen is soft.  Musculoskeletal:     Cervical back: Normal range of motion.     Comments: Decreased muscle strength  Skin:    General: Skin is warm and dry.  Neurological:     Mental Status: He is disoriented.  Psychiatric:        Attention and Perception: He is inattentive. He does not  perceive auditory or visual hallucinations.        Mood and Affect: Affect is inappropriate.        Speech: He is noncommunicative.        Behavior: Behavior is uncooperative.        Thought Content: Thought content is not paranoid or delusional. Thought content does not include homicidal or suicidal ideation. Thought content does not include homicidal or suicidal plan.        Cognition and Memory: Cognition is impaired. Memory is impaired.        Judgment: Judgment is impulsive.    Review of Systems  Psychiatric/Behavioral:  Positive for memory loss. Negative for depression, substance abuse and suicidal ideas.   All other systems reviewed and are negative.  Blood pressure (!) 152/62, pulse 81, temperature 98.8 F (37.1 C), temperature source Axillary, resp. rate 20, height '5\' 6"'$  (1.676 m), weight 61.3 kg, SpO2 100 %. Body mass index is  21.81 kg/m.  Treatment Plan Summary: Plan Medications adjusted: Haldol decreased from 5 mg TID to 5 mg daily IV, Lorazepam 0.5 mg PO or IV BID, restart Escitalopram 10 mg. Patient has recent history of  Alprazolam 1 mg BID use; unknown last dose, Lorazepam ordered to prevent withdrawal and manage agitation.   Disposition: No evidence of imminent risk to self or others at present.   Patient does not meet criteria for psychiatric inpatient admission. Medications adjusted to address agitation secondary to possible underlying medical ailments; patient remains in ICU receiving care.  Psychiatry will remain available for any future medication adjustments.   This service was provided via telemedicine using a 2-way, interactive audio and video technology.  Names of all persons participating in this telemedicine service and their role in this encounter. Name: Oneida Alar Role: PMHNP  Name: Hampton Abbot Role: Attending MD  Name: Debbe Odea Role: patient  Name:  Role:     Inda Merlin, NP 10/16/2021 4:45 PM

## 2021-10-16 NOTE — Progress Notes (Signed)
Progress Note   Patient: Jeremy Rodriguez [id number]:[id number] DOB: May 21, 1950 DOA: 10/13/2021     2 DOS: the patient was seen and examined on 10/16/2021   Brief summary 71 y.o. male with medical history significant of metastatic urothelial carcinoma (follows with Dr. Delton Coombes), GERD, hypertension admitted on 10/14/21 with pancytopenia and dehydration with AKI resulting in acute metabolic encephalopathy presumed secondary to dehydration and overmedication with opiates and benzos     Assessment and Plan: 1)Acute Toxic Metabolic Encephalopathy Patient was alert and oriented x3, he believed his girlfriend was making him to take his pain medication and Xanax (usually taking as needed) consistently Urine drug screen was positive for benzos and opioids -Please see documentation dated 10/14/2021 about patient's lack of capacity to make informed decisions at this time -Okay to use Haldol and lorazepam as needed for restlessness/agitation -TTS/Psychiatric evaluation once more stable -Prior history of alcohol abuse but apparently over the last few months has not been drinking much--- low clinical index of suspicion for DTs.... He is receiving lorazepam prn  10/16/21- Less agitated, more manageable, oriented to self and place   2)Acute kidney injury/dehydration BUN/creatinine showed 28/1.58 (baseline creatinine at 1.0-1.2) -BUN/Creatinine normalized with hydration Avoid nephrotoxic agents/dehydration/hypotension   3)Pancytopenia possibly due to chemotherapy Patient follows with Dr. Delton Coombes -Hgb is up to 8.7 from 6.6 after 1 unit of PRBC -Platelets up to 77 from 47 -WBC up to 3.4 from 2.5  4)Hypoalbuminemia possibly secondary to moderate protein calorie malnutrition Albumin 2.5,  -Nutritional supplements as desired  5)Metastatic urothelial carcinoma Patient follows with Dr. Delton Coombes -Previously on chemo treatment  6)Essential hypertension BP meds will be held at this time due to soft  BP  7)GERD Continue Protonix  8)Acute Hypoxic Respiratory Failure--- in the setting of benzos, requiring 2 L of oxygen via nasal cannula when he sleeps -While sleeping after Ativan on room air O2 sats was 88%, on 2 L 98% -O2 sats much better when he is awake   9)Social Ethics:-  -Concerns were made that patient's girlfriend was given him more than necessary prescription medications.  As per patient's daughter [name] patient's information on his phone was deleted and he was isolated from his family by girlfriend who was not picking up phone calls.  -APS and Environmental manager apparently notified by social workers  -discussed with pt's daughter that updates cannot be made to pt's girlfriend -Has only 1 child--daughter [name], -Who by default becomes patient's decision maker at this time -Please see separate note dated 10/14/2021 discussing patient's lack of capacity to make informed decisions about his health care at this time  Subjective:   10/16/21 Less agitated, more manageable, oriented to self and place -less restless -more cooperative....... -Oral intake is poor Soft wrist Restraints temporarily removed -Patient's daughter at bedside, questions answered  Physical Exam: Vitals:   10/16/21 0759 10/16/21 0800 10/16/21 0900 10/16/21 0917  BP: (!) 129/59 (!) 129/59 (!) 152/62   Pulse:    81  Resp: (!) 25 (!) 25 (!) 23 20  Temp:      TempSrc:      SpO2:    100%  Weight:      Height:        Physical Exam Gen:- in no apparent distress, more cooperative HEENT:- [place].AT, No sclera icterus Neck-Supple Neck,No JVD,.  Lungs-  CTAB , fair air movement bilaterally  CV- S1, S2 normal, RRR, right chest Port-A-Cath in situ Abd-  +ve B.Sounds, Abd Soft, No tenderness,  Extremity/Skin:- No  edema,   good pedal pulses  NeuroPsych-moving all extremities well, no obvious focal deficits, more cooperative, less agitated  Family Communication: Patient's daughter  [name] at bedside  Disposition: May need home health Versus SNF placement after he recovers medically  Author: Roxan Hockey, MD 10/16/2021 11:07 AM  For on call review [email or web address].

## 2021-10-17 DIAGNOSIS — R4182 Altered mental status, unspecified: Secondary | ICD-10-CM

## 2021-10-17 DIAGNOSIS — K219 Gastro-esophageal reflux disease without esophagitis: Secondary | ICD-10-CM | POA: Diagnosis not present

## 2021-10-17 DIAGNOSIS — N179 Acute kidney failure, unspecified: Secondary | ICD-10-CM | POA: Diagnosis not present

## 2021-10-17 DIAGNOSIS — E860 Dehydration: Secondary | ICD-10-CM | POA: Diagnosis not present

## 2021-10-17 LAB — GLUCOSE, CAPILLARY
Glucose-Capillary: 105 mg/dL — ABNORMAL HIGH (ref 70–99)
Glucose-Capillary: 115 mg/dL — ABNORMAL HIGH (ref 70–99)
Glucose-Capillary: 117 mg/dL — ABNORMAL HIGH (ref 70–99)
Glucose-Capillary: 118 mg/dL — ABNORMAL HIGH (ref 70–99)

## 2021-10-17 LAB — RENAL FUNCTION PANEL
Albumin: 2.3 g/dL — ABNORMAL LOW (ref 3.5–5.0)
Anion gap: 6 (ref 5–15)
BUN: 7 mg/dL — ABNORMAL LOW (ref 8–23)
CO2: 18 mmol/L — ABNORMAL LOW (ref 22–32)
Calcium: 8 mg/dL — ABNORMAL LOW (ref 8.9–10.3)
Chloride: 108 mmol/L (ref 98–111)
Creatinine, Ser: 0.73 mg/dL (ref 0.61–1.24)
GFR, Estimated: >60 mL/min (ref >60)
Glucose, Bld: 130 mg/dL — ABNORMAL HIGH (ref 70–99)
Phosphorus: 2.5 mg/dL (ref 2.5–4.6)
Potassium: 3.6 mmol/L (ref 3.5–5.1)
Sodium: 132 mmol/L — ABNORMAL LOW (ref 135–145)

## 2021-10-17 LAB — CBC
HCT: 26.7 % — ABNORMAL LOW (ref 39.0–52.0)
Hemoglobin: 8.9 g/dL — ABNORMAL LOW (ref 13.0–17.0)
MCH: 29.4 pg (ref 26.0–34.0)
MCHC: 33.3 g/dL (ref 30.0–36.0)
MCV: 88.1 fL (ref 80.0–100.0)
Platelets: 191 10*3/uL (ref 150–400)
RBC: 3.03 MIL/uL — ABNORMAL LOW (ref 4.22–5.81)
RDW: 13.6 % (ref 11.5–15.5)
WBC: 4.1 10*3/uL (ref 4.0–10.5)
nRBC: 0 % (ref 0.0–0.2)

## 2021-10-17 LAB — AMMONIA: Ammonia: <10 umol/L (ref 9–35)

## 2021-10-17 MED ORDER — HYDROCODONE-ACETAMINOPHEN 5-325 MG PO TABS
1.0000 | ORAL_TABLET | Freq: Once | ORAL | Status: AC
  Administered 2021-10-17: 1 via ORAL
  Filled 2021-10-17: qty 1

## 2021-10-17 MED ORDER — LORAZEPAM 0.5 MG PO TABS
0.5000 mg | ORAL_TABLET | Freq: Two times a day (BID) | ORAL | Status: DC
  Administered 2021-10-17 – 2021-10-19 (×4): 0.5 mg via ORAL
  Filled 2021-10-17 (×4): qty 1

## 2021-10-17 MED ORDER — HYDROCORTISONE 1 % EX CREA
TOPICAL_CREAM | Freq: Two times a day (BID) | CUTANEOUS | Status: DC | PRN
  Filled 2021-10-17: qty 28.4

## 2021-10-17 MED ORDER — QUETIAPINE FUMARATE 25 MG PO TABS
25.0000 mg | ORAL_TABLET | Freq: Every day | ORAL | Status: DC
  Administered 2021-10-17 – 2021-10-18 (×2): 25 mg via ORAL
  Filled 2021-10-17 (×2): qty 1

## 2021-10-17 NOTE — Progress Notes (Signed)
pt has been out of two point restraints since 0830, pt has not attempted to assult staff and is not currently pulling at devices. Sitter at bedside.  Graham crackers and peanut butter, and an ensure drink provided to pt.This Probation officer will continue to monitor

## 2021-10-17 NOTE — Progress Notes (Signed)
Progress Note   Patient: Jeremy Rodriguez [id number]:[id number] DOB: 1951-03-01 DOA: 10/13/2021     3 DOS: the patient was seen and examined on 10/17/2021   Brief summary 71 y.o. male with medical history significant of metastatic urothelial carcinoma (follows with Dr. Delton Coombes), GERD, hypertension admitted on 10/14/21 with pancytopenia and dehydration with AKI resulting in acute metabolic encephalopathy presumed secondary to dehydration and overmedication with opiates and benzos     Assessment and Plan: 1)Acute Toxic Metabolic Encephalopathy Patient was alert and oriented x3, he believed his girlfriend was making him to take his pain medication and Xanax (usually taking as needed) consistently Urine drug screen was positive for benzos and opioids -Please see documentation dated 10/14/2021 about patient's lack of capacity to make informed decisions at this time -TTS/Psychiatric evaluation/consult and recommendations appreciated -Prior history of alcohol abuse but apparently over the last few months has not been drinking much--- low clinical index of suspicion for DTs.... He is receiving lorazepam prn  10/17/21- -Patient is off restraints without significant agitation -Currently cooperative and manageable -Okay to stop Haldol IM -Use oral Seroquel 25 mg nightly -Lorazepam 0.5 mg twice daily scheduled for now, as well as lorazepam 1 mg IV as needed  2)Acute kidney injury/dehydration BUN/creatinine showed 28/1.58 (baseline creatinine at 1.0-1.2) -BUN/Creatinine normalized with hydration Avoid nephrotoxic agents/dehydration/hypotension   3)Pancytopenia possibly due to chemotherapy Patient follows with Dr. Delton Coombes -Hgb is up to  8.9 from 6.6 after 1 unit of PRBC -Platelets up to 191 from 47 -WBC up to 4.1 from 2.5  4)Hypoalbuminemia possibly secondary to moderate protein calorie malnutrition Albumin 2.5,  -Nutritional supplements as desired  5)Metastatic urothelial carcinoma Patient  follows with Dr. Delton Coombes -Previously on chemo treatment  6)Essential hypertension BP meds will be held at this time due to soft BP  7)GERD Continue Protonix  8)Acute Hypoxic Respiratory Failure--- in the setting of benzos, requiring 2 L of oxygen via nasal cannula when he sleeps -While sleeping after Ativan on room air O2 sats was 88%, on 2 L 98% -O2 sats much better when he is awake   9)Social Ethics:-  -Concerns were made that patient's girlfriend was given him more than necessary prescription medications.  As per patient's daughter [name] patient's information on his phone was deleted and he was isolated from his family by girlfriend who was not picking up phone calls.  -APS and Environmental manager apparently notified by social workers  -discussed with pt's daughter that updates cannot be made to pt's girlfriend -Has only 1 child--daughter [name], -Who by default becomes patient's decision maker at this time -Please see separate note dated 10/14/2021 discussing patient's lack of capacity to make informed decisions about his health care at this time  Subjective:   10/17/21 -Patient is off restraints without significant agitation -Currently cooperative and manageable -Oral intake improving -Patient's daughter Jeremy Rodriguez is at bedside  Physical Exam: Vitals:   10/16/21 1800 10/16/21 2203 10/17/21 0156 10/17/21 0539  BP: 122/65 134/70 (!) 149/74 (!) 145/79  Pulse:  81 84 76  Resp:  '19 18 18  '$ Temp: 98.4 F (36.9 C) 98.1 F (36.7 C) 98.4 F (36.9 C) 97.6 F (36.4 C)  TempSrc: Axillary Oral Axillary   SpO2: 100% 100% 100% 94%  Weight:      Height:       Physical Exam Gen:- in no apparent distress, more cooperative HEENT:- [place].AT, No sclera icterus Neck-Supple Neck,No JVD,.  Lungs-  CTAB , fair air movement bilaterally  CV- S1,  S2 normal, RRR, right chest Port-A-Cath in situ Abd-  +ve B.Sounds, Abd Soft, No tenderness,    Extremity/Skin:- No   edema,   good pedal pulses  NeuroPsych-moving all extremities well, no obvious focal deficits,  -Patient is off restraints without significant agitation -Currently cooperative and manageable  Family Communication: Patient's daughter Jeremy Rodriguez at bedside  Disposition: May need home health Versus SNF placement after he recovers medically  Author: Roxan Hockey, MD 10/17/2021 1:54 PM  For on call review [email or web address].

## 2021-10-17 NOTE — Progress Notes (Signed)
Pt rested off and on throughout the night. At times patient would  yell at sitter to "Go back to her country." Pt started to put off tele wires and male-wick. Pt was repositioned. Temp in room adjusted. Lights are dim. Pt also is asking for scissors and knives to cut his restraints. Pt was able to be redirected shortly by Probation officer. Writer educated pt on the rationale for the restraints. No evidence of understanding.

## 2021-10-18 DIAGNOSIS — G928 Other toxic encephalopathy: Secondary | ICD-10-CM | POA: Diagnosis not present

## 2021-10-18 LAB — TROPONIN I (HIGH SENSITIVITY)
Troponin I (High Sensitivity): 5 ng/L (ref <18)
Troponin I (High Sensitivity): 4 ng/L (ref <18)

## 2021-10-18 LAB — GLUCOSE, CAPILLARY
Glucose-Capillary: 102 mg/dL — ABNORMAL HIGH (ref 70–99)
Glucose-Capillary: 104 mg/dL — ABNORMAL HIGH (ref 70–99)
Glucose-Capillary: 132 mg/dL — ABNORMAL HIGH (ref 70–99)
Glucose-Capillary: 90 mg/dL (ref 70–99)
Glucose-Capillary: 99 mg/dL (ref 70–99)

## 2021-10-18 MED ORDER — BENZTROPINE MESYLATE 0.5 MG PO TABS: 0.5000 mg | ORAL_TABLET | Freq: Every day | ORAL | 2 refills | Status: DC

## 2021-10-18 MED ORDER — TRAZODONE HCL 100 MG PO TABS: 100.0000 mg | ORAL_TABLET | Freq: Every day | ORAL | 2 refills | Status: DC

## 2021-10-18 MED ORDER — QUETIAPINE FUMARATE 25 MG PO TABS: 25.0000 mg | ORAL_TABLET | Freq: Every day | ORAL | 2 refills | Status: DC

## 2021-10-18 MED ORDER — NICOTINE 21 MG/24HR TD PT24
21.0000 mg | MEDICATED_PATCH | Freq: Every day | TRANSDERMAL | 0 refills | Status: DC
Start: 2021-10-19 — End: 2021-12-01

## 2021-10-18 MED ORDER — PANTOPRAZOLE SODIUM 40 MG PO TBEC: 40.0000 mg | DELAYED_RELEASE_TABLET | Freq: Every day | ORAL | 6 refills | Status: DC

## 2021-10-18 MED ORDER — OXYCODONE-ACETAMINOPHEN 7.5-325 MG PO TABS
1.0000 | ORAL_TABLET | Freq: Once | ORAL | Status: AC
  Administered 2021-10-18: 1 via ORAL
  Filled 2021-10-18: qty 1

## 2021-10-18 MED ORDER — ONDANSETRON HCL 4 MG/2ML IJ SOLN
4.0000 mg | Freq: Four times a day (QID) | INTRAMUSCULAR | Status: DC | PRN
  Administered 2021-10-18 (×2): 4 mg via INTRAVENOUS
  Filled 2021-10-18 (×2): qty 2

## 2021-10-18 MED ORDER — OXYCODONE HCL 10 MG PO TABS: 10.0000 mg | ORAL_TABLET | Freq: Three times a day (TID) | ORAL | 0 refills | Status: DC | PRN

## 2021-10-18 MED ORDER — MEGESTROL ACETATE 400 MG/10ML PO SUSP
400.0000 mg | Freq: Two times a day (BID) | ORAL | Status: DC
Start: 2021-10-18 — End: 2021-10-19
  Administered 2021-10-18 – 2021-10-19 (×2): 400 mg via ORAL
  Filled 2021-10-18 (×2): qty 10

## 2021-10-18 MED ORDER — MEGESTROL ACETATE 400 MG/10ML PO SUSP: 400.0000 mg | Freq: Every day | ORAL | 3 refills | Status: AC

## 2021-10-18 MED ORDER — LORAZEPAM 0.5 MG PO TABS: 0.5000 mg | ORAL_TABLET | Freq: Two times a day (BID) | ORAL | 0 refills | Status: DC

## 2021-10-18 MED ORDER — ESCITALOPRAM OXALATE 10 MG PO TABS: 10.0000 mg | ORAL_TABLET | Freq: Every day | ORAL | 1 refills | Status: DC

## 2021-10-18 NOTE — Plan of Care (Signed)
  Problem: Pain Managment: Goal: General experience of comfort will improve Outcome: Progressing   Problem: Safety: Goal: Ability to remain free from injury will improve Outcome: Progressing   Problem: Skin Integrity: Goal: Risk for impaired skin integrity will decrease Outcome: Progressing   

## 2021-10-18 NOTE — Progress Notes (Signed)
  10/18/21 -Patient has  been  off restraints without significant agitation for >> 48 hrs  -Currently cooperative and manageable -Patient's daughter [name] is at bedside -Patient continues to await possibly going home rather than going to SNF -Mentation has improved significantly --Okay to discontinue IVC hold -Okay to discontinue one-to-one Air cabin crew  Roxan Hockey, MD

## 2021-10-18 NOTE — Plan of Care (Signed)

## 2021-10-18 NOTE — Progress Notes (Signed)
Pt stated his "heart hurt" after walking to the bathroom. Denies radiating pain, left arm pain, changes in vision, shortness of breath. Roxan Hockey, MD notified and to come to bedside. EKG obtained per MD.

## 2021-10-18 NOTE — Progress Notes (Addendum)
Progress Note   Patient: Jeremy Rodriguez [id number]:[id number] DOB: 1950/04/26 DOA: 10/13/2021     4 DOS: the patient was seen and examined on 10/18/2021   Brief summary 71 y.o. male with medical history significant of metastatic urothelial carcinoma (follows with Dr. Delton Coombes), GERD, hypertension admitted on 10/14/21 with pancytopenia and dehydration with AKI resulting in acute metabolic encephalopathy presumed secondary to dehydration and overmedication with opiates and benzos     Assessment and Plan: 1)Acute Toxic Metabolic Encephalopathy Patient was alert and oriented x3, he believed his girlfriend was making him to take his pain medication and Xanax (usually taking as needed) consistently Urine drug screen was positive for benzos and opioids -Please see documentation dated 10/14/2021 about patient's lack of capacity to make informed decisions at this time -TTS/Psychiatric evaluation/consult and recommendations appreciated -Prior history of alcohol abuse but apparently over the last few months has not been drinking much--- low clinical index of suspicion for DTs.... He is receiving lorazepam prn  10/18/21- -Patient is off restraints without significant agitation -Currently cooperative and manageable -Okay to stop Haldol IM -Use oral Seroquel 25 mg nightly -Lorazepam 0.5 mg twice daily scheduled for now, as well as lorazepam 1 mg IV as needed  2)Acute kidney injury/dehydration BUN/creatinine showed 28/1.58 (baseline creatinine at 1.0-1.2) -BUN/Creatinine normalized with hydration Avoid nephrotoxic agents/dehydration/hypotension   3)Pancytopenia possibly due to chemotherapy Patient follows with Dr. Delton Coombes -Hgb is up to  8.9 from 6.6 after 1 unit of PRBC -Platelets up to 191 from 47 -WBC up to 4.1 from 2.5  4)Hypoalbuminemia possibly secondary to moderate protein calorie malnutrition Albumin 2.5,  -Nutritional supplements as desired  5)Metastatic urothelial carcinoma Patient  follows with Dr. Delton Coombes -Previously on chemo treatment  6)Essential hypertension BP meds will be held at this time due to soft BP  7)GERD Continue Protonix  8)Acute Hypoxic Respiratory Failure--- in the setting of benzos,   -Resolved hypoxia  9)Social Ethics:-  -Concerns were made that patient's girlfriend was given him more than necessary prescription medications.  As per patient's daughter [name] patient's information on his phone was deleted and he was isolated from his family by girlfriend who was not picking up phone calls.  -APS and Environmental manager apparently notified by social workers  -discussed with pt's daughter that updates cannot be made to pt's girlfriend -Has only 1 child--daughter [name], -Who by default becomes patient's decision maker at this time -Please see separate note dated 10/14/2021 discussing patient's lack of capacity to make informed decisions about his health care at this time  Subjective:   10/18/21 -Patient is off restraints without significant agitation -Currently cooperative and manageable -Oral intake improving -Patient's daughter Jeremy Rodriguez is at bedside -Patient continues to await possibly going home rather than going to SNF -Mentation has improved significantly - Complained of chest discomfort earlier today, EKG and troponin x2 nonacute -Currently chest pain-free  Physical Exam: Vitals:   10/17/21 1432 10/17/21 2101 10/18/21 1123 10/18/21 1259  BP: 132/65 130/65 (!) 105/56 (!) 100/50  Pulse: 94 79 88 88  Resp: '17 18 18 18  '$ Temp: 98.3 F (36.8 C) 97.7 F (36.5 C) 98.6 F (37 C) 98.4 F (36.9 C)  TempSrc: Oral Oral  Oral  SpO2: 100% 100% 97% 100%  Weight:      Height:       Physical Exam Gen:- in no apparent distress, more cooperative HEENT:- [place].AT, No sclera icterus Neck-Supple Neck,No JVD,.  Lungs-  CTAB , fair air movement bilaterally  CV- S1,  S2 normal, RRR, right chest Port-A-Cath in situ Abd-   +ve B.Sounds, Abd Soft, No tenderness,    Extremity/Skin:- No  edema,   good pedal pulses  NeuroPsych-moving all extremities well, no obvious focal deficits,  -Patient is off restraints without significant agitation -Currently cooperative and manageable  Family Communication: Patient's daughter Jeremy Rodriguez at bedside  Disposition: May need home health Versus SNF placement after he recovers medically  Author: Roxan Hockey, MD 10/18/2021 8:12 PM  For on call review [email or web address].

## 2021-10-19 DIAGNOSIS — G928 Other toxic encephalopathy: Secondary | ICD-10-CM | POA: Diagnosis not present

## 2021-10-19 LAB — GLUCOSE, CAPILLARY
Glucose-Capillary: 113 mg/dL — ABNORMAL HIGH (ref 70–99)
Glucose-Capillary: 88 mg/dL (ref 70–99)

## 2021-10-19 MED ORDER — HEPARIN SOD (PORK) LOCK FLUSH 100 UNIT/ML IV SOLN
500.0000 [IU] | INTRAVENOUS | Status: DC | PRN
  Filled 2021-10-19: qty 5

## 2021-10-19 NOTE — Care Management Important Message (Signed)
Important Message  Patient Details  Name: Jeremy Rodriguez MRN: [id number] Date of Birth: 03/20/1951   Medicare Important Message Given:  Yes (late entry, copy mailed to address on file)     Tommy Medal 10/19/2021, 1:18 PM

## 2021-10-19 NOTE — TOC Transition Note (Signed)
Transition of Care [facility]) - CM/SW Discharge Note   Patient Details  Name: Jeremy Rodriguez MRN: [id number] Date of Birth: 18-Mar-1951  Transition of Care [facility] CM/SW Contact:  Shade Flood, LCSW Phone Number: 10/19/2021, 10:26 AM   Clinical Narrative:     Pt stable for dc today per MD. Plan is for pt to return home with his daughter and outpatient PT follow up. There are no TOC needs for dc.  Final next level of care: Home/Self Care Barriers to Discharge: Barriers Resolved   Patient Goals and CMS Choice        Discharge Placement                       Discharge Plan and Services In-house Referral: Clinical Social Work                                   Social Determinants of Health (SDOH) Interventions     Readmission Risk Interventions    10/15/2021   10:26 AM  Readmission Risk Prevention Plan  Transportation Screening Complete  HRI or Home Care Consult Complete  Social Work Consult for [place] Planning/Counseling Complete  Palliative Care Screening Not Applicable  Medication Review Press photographer) Complete

## 2021-10-19 NOTE — Progress Notes (Signed)
IVC faxed and confirmation received, per ED patient may d/c. Roxan Hockey, MD aware.  AVS given and reviewed with pt and daughter, [place], and Dr. Roxan Hockey at bedside. Medications discussed. All questions answered to satisfaction. Pt verbalized understanding of information given. Pt escorted off the unit with all belongings via wheelchair by staff member.

## 2021-10-19 NOTE — Plan of Care (Signed)

## 2021-10-19 NOTE — Discharge Instructions (Signed)
1)You have improved medically however you remain physically weak/deconditioned--- you are very very high risk for falls and injury----you are being discharged home at your request with the understanding that your daughter [name] will be staying with you for the next 1 to 2 weeks on a regular basis and after that on as-needed basis--- your daughter [name] hopefully can take you/drive you to outpatient physical therapy rehab to help you get stronger  2)Please follow-up with your oncologist in 1 to 2 weeks for ongoing cancer treatment/monitoring Please follow-up with hematologist/oncologist Dr. Derek Jack, MD -Address: inside [facility] ([address]th Floor[facility] [address], [place], [place] [address] Phone: [phone]  3)Please note that there has been several changes to your medications--- please take your medications exactly as prescribed to avoid complications  4)Follow-up with your primary care physician in about a week or so for repeat CBC and BMP blood test
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
## 2021-10-20 ENCOUNTER — Telehealth (HOSPITAL_COMMUNITY): Payer: Self-pay | Admitting: *Deleted

## 2021-10-20 NOTE — Telephone Encounter (Signed)
Shulem Mader daughter Daryll Drown was contacted by telephone to verify understanding of discharge instructions status post their most recent discharge from the hospital on the date:  10/20/21.  Inpatient discharge AVS was re-reviewed with patient, along with cancer center appointments.  Verification of understanding for oncology specific follow-up was validated using the Teach Back method.  States that patient is back at home and plans to come to appointments scheduled for tomorrow.  Transportation to appointments were confirmed for the patient as being self/caregiver.  Alexandrea Opfer's questions were addressed to their satisfaction upon completion of this post discharge follow-up call for outpatient oncology.

## 2021-10-21 ENCOUNTER — Other Ambulatory Visit: Payer: Self-pay | Admitting: Internal Medicine

## 2021-10-21 ENCOUNTER — Inpatient Hospital Stay (HOSPITAL_COMMUNITY): Payer: Medicare Other

## 2021-10-21 ENCOUNTER — Encounter (HOSPITAL_COMMUNITY): Payer: Self-pay

## 2021-10-21 ENCOUNTER — Inpatient Hospital Stay (HOSPITAL_COMMUNITY): Payer: Medicare Other | Admitting: Hematology

## 2021-10-21 ENCOUNTER — Telehealth (HOSPITAL_COMMUNITY): Payer: Self-pay | Admitting: *Deleted

## 2021-10-21 NOTE — Telephone Encounter (Signed)
Called patient's daughter Jeremy Rodriguez to follow up, as he has not arrived for any of his appointments.  She states that "he is not going to be at appointments today and hopes he comes next week."  Expressed that he has gone back home and "girlfriend" Jeremy Rodriguez is there with him again.

## 2021-10-22 ENCOUNTER — Telehealth (HOSPITAL_COMMUNITY): Payer: Self-pay | Admitting: *Deleted

## 2021-10-22 ENCOUNTER — Other Ambulatory Visit (HOSPITAL_COMMUNITY): Payer: Self-pay | Admitting: Hematology

## 2021-10-22 MED ORDER — OXYCODONE HCL 10 MG PO TABS: 10.0000 mg | ORAL_TABLET | Freq: Three times a day (TID) | ORAL | 0 refills | Status: DC | PRN

## 2021-10-22 NOTE — Telephone Encounter (Signed)
Patient contacted to respond to my chart message request.  He states that he is currently in pain an only has 1 pain pill left.  Dose was changed to oxycodone 10 mg upon discharge from hospital, see discharge summary.  He is requesting 20 mg dose. Dr. Delton Coombes made aware of request.

## 2021-10-25 ENCOUNTER — Other Ambulatory Visit: Payer: Self-pay

## 2021-10-26 ENCOUNTER — Other Ambulatory Visit: Payer: Self-pay | Admitting: Internal Medicine

## 2021-10-28 ENCOUNTER — Inpatient Hospital Stay (HOSPITAL_COMMUNITY): Payer: Medicare Other

## 2021-10-28 VITALS — BP 153/67 | HR 73 | Temp 97.9°F | Resp 18

## 2021-10-28 DIAGNOSIS — C679 Malignant neoplasm of bladder, unspecified: Secondary | ICD-10-CM | POA: Diagnosis not present

## 2021-10-28 DIAGNOSIS — C7910 Secondary malignant neoplasm of unspecified urinary organs: Secondary | ICD-10-CM

## 2021-10-28 DIAGNOSIS — C787 Secondary malignant neoplasm of liver and intrahepatic bile duct: Secondary | ICD-10-CM | POA: Diagnosis not present

## 2021-10-28 DIAGNOSIS — C652 Malignant neoplasm of left renal pelvis: Secondary | ICD-10-CM

## 2021-10-28 DIAGNOSIS — Z5111 Encounter for antineoplastic chemotherapy: Secondary | ICD-10-CM | POA: Diagnosis not present

## 2021-10-28 LAB — COMPREHENSIVE METABOLIC PANEL
ALT: 16 U/L (ref 0–44)
AST: 19 U/L (ref 15–41)
Albumin: 2.6 g/dL — ABNORMAL LOW (ref 3.5–5.0)
Alkaline Phosphatase: 111 U/L (ref 38–126)
Anion gap: 7 (ref 5–15)
BUN: 11 mg/dL (ref 8–23)
CO2: 22 mmol/L (ref 22–32)
Calcium: 8.6 mg/dL — ABNORMAL LOW (ref 8.9–10.3)
Chloride: 104 mmol/L (ref 98–111)
Creatinine, Ser: 1.11 mg/dL (ref 0.61–1.24)
GFR, Estimated: >60 mL/min (ref >60)
Glucose, Bld: 155 mg/dL — ABNORMAL HIGH (ref 70–99)
Potassium: 3.8 mmol/L (ref 3.5–5.1)
Sodium: 133 mmol/L — ABNORMAL LOW (ref 135–145)
Total Bilirubin: 0.5 mg/dL (ref 0.3–1.2)
Total Protein: 6.5 g/dL (ref 6.5–8.1)

## 2021-10-28 LAB — CBC WITH DIFFERENTIAL/PLATELET
Abs Immature Granulocytes: 0.19 10*3/uL — ABNORMAL HIGH (ref 0.00–0.07)
Basophils Absolute: 0.1 10*3/uL (ref 0.0–0.1)
Basophils Relative: 1 %
Eosinophils Absolute: 0 10*3/uL (ref 0.0–0.5)
Eosinophils Relative: 0 %
HCT: 29.9 % — ABNORMAL LOW (ref 39.0–52.0)
Hemoglobin: 9.8 g/dL — ABNORMAL LOW (ref 13.0–17.0)
Immature Granulocytes: 2 %
Lymphocytes Relative: 16 %
Lymphs Abs: 1.4 10*3/uL (ref 0.7–4.0)
MCH: 29.6 pg (ref 26.0–34.0)
MCHC: 32.8 g/dL (ref 30.0–36.0)
MCV: 90.3 fL (ref 80.0–100.0)
Monocytes Absolute: 1.6 10*3/uL — ABNORMAL HIGH (ref 0.1–1.0)
Monocytes Relative: 18 %
Neutro Abs: 5.4 10*3/uL (ref 1.7–7.7)
Neutrophils Relative %: 63 %
Platelets: 456 10*3/uL — ABNORMAL HIGH (ref 150–400)
RBC: 3.31 MIL/uL — ABNORMAL LOW (ref 4.22–5.81)
RDW: 15.1 % (ref 11.5–15.5)
WBC: 8.6 10*3/uL (ref 4.0–10.5)
nRBC: 0 % (ref 0.0–0.2)

## 2021-10-28 LAB — MAGNESIUM: Magnesium: 1.7 mg/dL (ref 1.7–2.4)

## 2021-10-28 MED ORDER — SODIUM CHLORIDE 0.9 % IV SOLN
1000.0000 mg/m2 | Freq: Once | INTRAVENOUS | Status: AC
  Administered 2021-10-28: 1748 mg via INTRAVENOUS
  Filled 2021-10-28: qty 45.97

## 2021-10-28 MED ORDER — SODIUM CHLORIDE 0.9 % IV SOLN: Freq: Once | INTRAVENOUS | Status: AC

## 2021-10-28 MED ORDER — SODIUM CHLORIDE 0.9 % IV SOLN
400.0000 mg | Freq: Once | INTRAVENOUS | Status: AC
  Administered 2021-10-28: 400 mg via INTRAVENOUS
  Filled 2021-10-28: qty 40

## 2021-10-28 MED ORDER — SODIUM CHLORIDE 0.9 % IV SOLN
10.0000 mg | Freq: Once | INTRAVENOUS | Status: AC
  Administered 2021-10-28: 10 mg via INTRAVENOUS
  Filled 2021-10-28: qty 10

## 2021-10-28 MED ORDER — SODIUM CHLORIDE 0.9 % IV SOLN
150.0000 mg | Freq: Once | INTRAVENOUS | Status: AC
  Administered 2021-10-28: 150 mg via INTRAVENOUS
  Filled 2021-10-28: qty 150

## 2021-10-28 MED ORDER — PALONOSETRON HCL INJECTION 0.25 MG/5ML
0.2500 mg | Freq: Once | INTRAVENOUS | Status: AC
  Administered 2021-10-28: 0.25 mg via INTRAVENOUS
  Filled 2021-10-28: qty 5

## 2021-10-28 MED ORDER — SODIUM CHLORIDE 0.9% FLUSH
10.0000 mL | INTRAVENOUS | Status: DC | PRN
  Administered 2021-10-28: 10 mL

## 2021-10-28 MED ORDER — HEPARIN SOD (PORK) LOCK FLUSH 100 UNIT/ML IV SOLN
500.0000 [IU] | Freq: Once | INTRAVENOUS | Status: AC | PRN
  Administered 2021-10-28: 500 [IU]

## 2021-10-28 NOTE — Progress Notes (Signed)
Confirmed carboplatin dose of AUC 5 (400 mg) for today.  Creatinine is 1.11 today with new weight 62.7 kg   T.O. Dr Rhys Martini, PharmD

## 2021-10-28 NOTE — Progress Notes (Signed)
Pt presents today for Gemzar and Carboplatin per provider's order. Vital signs and labs WNL for treatment today. Okay to proceed with treatment.  Gemzar and Carboplatin given today per MD orders. Tolerated infusion without adverse affects. Vital signs stable. No complaints at this time. Discharged from clinic ambulatory in stable condition. Alert and oriented x 3. F/U with [facility] as scheduled.

## 2021-10-28 NOTE — Patient Instructions (Signed)
[facility]  Discharge Instructions: Thank you for choosing [facility] to provide your oncology and hematology care.  If you have a lab appointment with the [facility], please come in thru the Main Entrance and check in at the main information desk.  Wear comfortable clothing and clothing appropriate for easy access to any Portacath or PICC line.   We strive to give you quality time with your provider. You may need to reschedule your appointment if you arrive late (15 or more minutes).  Arriving late affects you and other patients whose appointments are after yours.  Also, if you miss three or more appointments without notifying the office, you may be dismissed from the clinic at the provider's discretion.      For prescription refill requests, have your pharmacy contact our office and allow 72 hours for refills to be completed.    Today you received the following chemotherapy and/or immunotherapy agents Gemzar and Carboplatin.   To help prevent nausea and vomiting after your treatment, we encourage you to take your nausea medication as directed.  BELOW ARE SYMPTOMS THAT SHOULD BE REPORTED IMMEDIATELY: *FEVER GREATER THAN 100.4 F (38 C) OR HIGHER *CHILLS OR SWEATING *NAUSEA AND VOMITING THAT IS NOT CONTROLLED WITH YOUR NAUSEA MEDICATION *UNUSUAL SHORTNESS OF BREATH *UNUSUAL BRUISING OR BLEEDING *URINARY PROBLEMS (pain or burning when urinating, or frequent urination) *BOWEL PROBLEMS (unusual diarrhea, constipation, pain near the anus) TENDERNESS IN MOUTH AND THROAT WITH OR WITHOUT PRESENCE OF ULCERS (sore throat, sores in mouth, or a toothache) UNUSUAL RASH, SWELLING OR PAIN  UNUSUAL VAGINAL DISCHARGE OR ITCHING   Items with * indicate a potential emergency and should be followed up as soon as possible or go to the Emergency Department if any problems should occur.  Please show the CHEMOTHERAPY ALERT CARD or IMMUNOTHERAPY ALERT CARD at check-in to the  Emergency Department and triage nurse.  Should you have questions after your visit or need to cancel or reschedule your appointment, please contact [facility] [phone]  and follow the prompts.  Office hours are 8:00 a.m. to 4:30 p.m. Monday - Friday. Please note that voicemails left after 4:00 p.m. may not be returned until the following business day.  We are closed weekends and major holidays. You have access to a nurse at all times for urgent questions. Please call the main number to the clinic [phone] and follow the prompts.  For any non-urgent questions, you may also contact your provider using MyChart. We now offer e-Visits for anyone 55 and older to request care online for non-urgent symptoms. For details visit [email or web address].   Also download the MyChart app! Go to the app store, search "MyChart", open the app, select [facility], and log in with your MyChart username and password.  Masks are optional in the cancer centers. If you would like for your care team to wear a mask while they are taking care of you, please let them know. For doctor visits, patients may have with them one support person who is at least 71 years old. At this time, visitors are not allowed in the infusion area. Gemcitabine injection What is this medication? GEMCITABINE (jem SYE ta been) is a chemotherapy drug. This medicine is used to treat many types of cancer like breast cancer, lung cancer, pancreatic cancer, and ovarian cancer. This medicine may be used for other purposes; ask your health care provider or pharmacist if you have questions. COMMON BRAND NAME(S): Gemzar, Infugem What should  I tell my care team before I take this medication? They need to know if you have any of these conditions: blood disorders infection kidney disease liver disease lung or breathing disease, like asthma recent or ongoing radiation therapy an unusual or allergic reaction to gemcitabine, other  chemotherapy, other medicines, foods, dyes, or preservatives pregnant or trying to get pregnant breast-feeding How should I use this medication? This drug is given as an infusion into a vein. It is administered in a hospital or clinic by a specially trained health care professional. Talk to your pediatrician regarding the use of this medicine in children. Special care may be needed. Overdosage: If you think you have taken too much of this medicine contact a poison control center or emergency room at once. NOTE: This medicine is only for you. Do not share this medicine with others. What if I miss a dose? It is important not to miss your dose. Call your doctor or health care professional if you are unable to keep an appointment. What may interact with this medication? medicines to increase blood counts like filgrastim, pegfilgrastim, sargramostim some other chemotherapy drugs like cisplatin vaccines Talk to your doctor or health care professional before taking any of these medicines: acetaminophen aspirin ibuprofen ketoprofen naproxen This list may not describe all possible interactions. Give your health care provider a list of all the medicines, herbs, non-prescription drugs, or dietary supplements you use. Also tell them if you smoke, drink alcohol, or use illegal drugs. Some items may interact with your medicine. What should I watch for while using this medication? Visit your doctor for checks on your progress. This drug may make you feel generally unwell. This is not uncommon, as chemotherapy can affect healthy cells as well as cancer cells. Report any side effects. Continue your course of treatment even though you feel ill unless your doctor tells you to stop. In some cases, you may be given additional medicines to help with side effects. Follow all directions for their use. Call your doctor or health care professional for advice if you get a fever, chills or sore throat, or other symptoms  of a cold or flu. Do not treat yourself. This drug decreases your body's ability to fight infections. Try to avoid being around people who are sick. This medicine may increase your risk to bruise or bleed. Call your doctor or health care professional if you notice any unusual bleeding. Be careful brushing and flossing your teeth or using a toothpick because you may get an infection or bleed more easily. If you have any dental work done, tell your dentist you are receiving this medicine. Avoid taking products that contain aspirin, acetaminophen, ibuprofen, naproxen, or ketoprofen unless instructed by your doctor. These medicines may hide a fever. Do not become pregnant while taking this medicine or for 6 months after stopping it. Women should inform their doctor if they wish to become pregnant or think they might be pregnant. Men should not father a child while taking this medicine and for 3 months after stopping it. There is a potential for serious side effects to an unborn child. Talk to your health care professional or pharmacist for more information. Do not breast-feed an infant while taking this medicine or for at least 1 week after stopping it. Men should inform their doctors if they wish to father a child. This medicine may lower sperm counts. Talk with your doctor or health care professional if you are concerned about your fertility. What side effects may  I notice from receiving this medication? Side effects that you should report to your doctor or health care professional as soon as possible: allergic reactions like skin rash, itching or hives, swelling of the face, lips, or tongue breathing problems pain, redness, or irritation at site where injected signs and symptoms of a dangerous change in heartbeat or heart rhythm like chest pain; dizziness; fast or irregular heartbeat; palpitations; feeling faint or lightheaded, falls; breathing problems signs of decreased platelets or bleeding - bruising,  pinpoint red spots on the skin, black, tarry stools, blood in the urine signs of decreased red blood cells - unusually weak or tired, feeling faint or lightheaded, falls signs of infection - fever or chills, cough, sore throat, pain or difficulty passing urine signs and symptoms of kidney injury like trouble passing urine or change in the amount of urine signs and symptoms of liver injury like dark yellow or brown urine; general ill feeling or flu-like symptoms; light-colored stools; loss of appetite; nausea; right upper belly pain; unusually weak or tired; yellowing of the eyes or skin swelling of ankles, feet, hands Side effects that usually do not require medical attention (report to your doctor or health care professional if they continue or are bothersome): constipation diarrhea hair loss loss of appetite nausea rash vomiting This list may not describe all possible side effects. Call your doctor for medical advice about side effects. You may report side effects to FDA at 1-800-FDA-1088. Where should I keep my medication? This drug is given in a hospital or clinic and will not be stored at home. NOTE: This sheet is a summary. It may not cover all possible information. If you have questions about this medicine, talk to your doctor, pharmacist, or health care provider.  2023 Elsevier/Gold Standard (2017-06-14 00:00:00) Carboplatin injection What is this medication? CARBOPLATIN (KAR boe pla tin) is a chemotherapy drug. It targets fast dividing cells, like cancer cells, and causes these cells to die. This medicine is used to treat ovarian cancer and many other cancers. This medicine may be used for other purposes; ask your health care provider or pharmacist if you have questions. COMMON BRAND NAME(S): Paraplatin What should I tell my care team before I take this medication? They need to know if you have any of these conditions: blood disorders hearing problems kidney disease recent or  ongoing radiation therapy an unusual or allergic reaction to carboplatin, cisplatin, other chemotherapy, other medicines, foods, dyes, or preservatives pregnant or trying to get pregnant breast-feeding How should I use this medication? This drug is usually given as an infusion into a vein. It is administered in a hospital or clinic by a specially trained health care professional. Talk to your pediatrician regarding the use of this medicine in children. Special care may be needed. Overdosage: If you think you have taken too much of this medicine contact a poison control center or emergency room at once. NOTE: This medicine is only for you. Do not share this medicine with others. What if I miss a dose? It is important not to miss a dose. Call your doctor or health care professional if you are unable to keep an appointment. What may interact with this medication? medicines for seizures medicines to increase blood counts like filgrastim, pegfilgrastim, sargramostim some antibiotics like amikacin, gentamicin, neomycin, streptomycin, tobramycin vaccines Talk to your doctor or health care professional before taking any of these medicines: acetaminophen aspirin ibuprofen ketoprofen naproxen This list may not describe all possible interactions. Give your health care  provider a list of all the medicines, herbs, non-prescription drugs, or dietary supplements you use. Also tell them if you smoke, drink alcohol, or use illegal drugs. Some items may interact with your medicine. What should I watch for while using this medication? Your condition will be monitored carefully while you are receiving this medicine. You will need important blood work done while you are taking this medicine. This drug may make you feel generally unwell. This is not uncommon, as chemotherapy can affect healthy cells as well as cancer cells. Report any side effects. Continue your course of treatment even though you feel ill unless  your doctor tells you to stop. In some cases, you may be given additional medicines to help with side effects. Follow all directions for their use. Call your doctor or health care professional for advice if you get a fever, chills or sore throat, or other symptoms of a cold or flu. Do not treat yourself. This drug decreases your body's ability to fight infections. Try to avoid being around people who are sick. This medicine may increase your risk to bruise or bleed. Call your doctor or health care professional if you notice any unusual bleeding. Be careful brushing and flossing your teeth or using a toothpick because you may get an infection or bleed more easily. If you have any dental work done, tell your dentist you are receiving this medicine. Avoid taking products that contain aspirin, acetaminophen, ibuprofen, naproxen, or ketoprofen unless instructed by your doctor. These medicines may hide a fever. Do not become pregnant while taking this medicine. Women should inform their doctor if they wish to become pregnant or think they might be pregnant. There is a potential for serious side effects to an unborn child. Talk to your health care professional or pharmacist for more information. Do not breast-feed an infant while taking this medicine. What side effects may I notice from receiving this medication? Side effects that you should report to your doctor or health care professional as soon as possible: allergic reactions like skin rash, itching or hives, swelling of the face, lips, or tongue signs of infection - fever or chills, cough, sore throat, pain or difficulty passing urine signs of decreased platelets or bleeding - bruising, pinpoint red spots on the skin, black, tarry stools, nosebleeds signs of decreased red blood cells - unusually weak or tired, fainting spells, lightheadedness breathing problems changes in hearing changes in vision chest pain high blood pressure low blood counts -  This drug may decrease the number of white blood cells, red blood cells and platelets. You may be at increased risk for infections and bleeding. nausea and vomiting pain, swelling, redness or irritation at the injection site pain, tingling, numbness in the hands or feet problems with balance, talking, walking trouble passing urine or change in the amount of urine Side effects that usually do not require medical attention (report to your doctor or health care professional if they continue or are bothersome): hair loss loss of appetite metallic taste in the mouth or changes in taste This list may not describe all possible side effects. Call your doctor for medical advice about side effects. You may report side effects to FDA at 1-800-FDA-1088. Where should I keep my medication? This drug is given in a hospital or clinic and will not be stored at home. NOTE: This sheet is a summary. It may not cover all possible information. If you have questions about this medicine, talk to your doctor, pharmacist, or health care  provider.  2023 Elsevier/Gold Standard (2007-08-29 00:00:00)

## 2021-10-31 ENCOUNTER — Encounter (HOSPITAL_COMMUNITY): Payer: Self-pay

## 2021-10-31 ENCOUNTER — Other Ambulatory Visit: Payer: Self-pay

## 2021-10-31 ENCOUNTER — Emergency Department (HOSPITAL_COMMUNITY)
Admission: EM | Admit: 2021-10-31 | Discharge: 2021-11-01 | Disposition: A | Discharge status: Home / Self Care | Payer: Medicare Other | Attending: Emergency Medicine | Admitting: Emergency Medicine

## 2021-10-31 ENCOUNTER — Emergency Department (HOSPITAL_COMMUNITY): Payer: Medicare Other

## 2021-10-31 DIAGNOSIS — R1084 Generalized abdominal pain: Secondary | ICD-10-CM

## 2021-10-31 DIAGNOSIS — Z8551 Personal history of malignant neoplasm of bladder: Secondary | ICD-10-CM | POA: Insufficient documentation

## 2021-10-31 DIAGNOSIS — I10 Essential (primary) hypertension: Secondary | ICD-10-CM | POA: Diagnosis not present

## 2021-10-31 DIAGNOSIS — R109 Unspecified abdominal pain: Secondary | ICD-10-CM | POA: Insufficient documentation

## 2021-10-31 DIAGNOSIS — K219 Gastro-esophageal reflux disease without esophagitis: Secondary | ICD-10-CM | POA: Diagnosis not present

## 2021-10-31 LAB — CBC WITH DIFFERENTIAL/PLATELET
Abs Immature Granulocytes: 0.02 10*3/uL (ref 0.00–0.07)
Basophils Absolute: 0 10*3/uL (ref 0.0–0.1)
Basophils Relative: 0 %
Eosinophils Absolute: 0.1 10*3/uL (ref 0.0–0.5)
Eosinophils Relative: 1 %
HCT: 25.9 % — ABNORMAL LOW (ref 39.0–52.0)
Hemoglobin: 8.7 g/dL — ABNORMAL LOW (ref 13.0–17.0)
Immature Granulocytes: 0 %
Lymphocytes Relative: 12 %
Lymphs Abs: 0.8 10*3/uL (ref 0.7–4.0)
MCH: 29.8 pg (ref 26.0–34.0)
MCHC: 33.6 g/dL (ref 30.0–36.0)
MCV: 88.7 fL (ref 80.0–100.0)
Monocytes Absolute: 0.1 10*3/uL (ref 0.1–1.0)
Monocytes Relative: 2 %
Neutro Abs: 5.5 10*3/uL (ref 1.7–7.7)
Neutrophils Relative %: 85 %
Platelets: 276 10*3/uL (ref 150–400)
RBC: 2.92 MIL/uL — ABNORMAL LOW (ref 4.22–5.81)
RDW: 14.8 % (ref 11.5–15.5)
WBC: 6.5 10*3/uL (ref 4.0–10.5)
nRBC: 0 % (ref 0.0–0.2)

## 2021-10-31 MED ORDER — SODIUM CHLORIDE 0.9 % IV BOLUS
1000.0000 mL | Freq: Once | INTRAVENOUS | Status: AC
  Administered 2021-10-31: 1000 mL via INTRAVENOUS

## 2021-10-31 MED ORDER — ONDANSETRON HCL 4 MG/2ML IJ SOLN
4.0000 mg | Freq: Once | INTRAMUSCULAR | Status: AC
Start: 2021-10-31 — End: 2021-10-31
  Administered 2021-10-31: 4 mg via INTRAVENOUS
  Filled 2021-10-31: qty 2

## 2021-10-31 MED ORDER — IOHEXOL 300 MG/ML  SOLN
100.0000 mL | Freq: Once | INTRAMUSCULAR | Status: AC | PRN
  Administered 2021-10-31: 100 mL via INTRAVENOUS

## 2021-10-31 MED ORDER — MORPHINE SULFATE (PF) 4 MG/ML IV SOLN
4.0000 mg | Freq: Once | INTRAVENOUS | Status: AC
  Administered 2021-10-31: 4 mg via INTRAVENOUS
  Filled 2021-10-31: qty 1

## 2021-10-31 NOTE — ED Notes (Signed)
MD made aware that pt is in pain and is requesting to be seen

## 2021-10-31 NOTE — ED Triage Notes (Addendum)
Pt presents with stomach cramps that started x 2 days ago. Pt thinks he is constipated and last BM was 4 days ago. Pt is a cancer pt and has had very little to eat or drink. Receiving active chemo and pt takes narcotics for pain which is contributing to constipation.

## 2021-10-31 NOTE — ED Provider Notes (Incomplete)
[facility] EMERGENCY DEPARTMENT Provider Note   CSN: [id number] Arrival date & time: 10/31/21  2115     History  Chief Complaint  Patient presents with   Constipation    Jeremy Rodriguez is a 71 y.o. male.  Patient is a 71 year old male with past medical history of metastatic urothelial carcinoma, hypertension, GERD.  Patient presenting today with a several day history of worsening abdominal pain.  He reports having not had a bowel movement during this period of time.  He denies any nausea or vomiting.  He denies any fevers or chills.  He is currently receiving chemotherapy.  He has tried taking MiraLAX and Senokot with little relief.  The history is provided by the patient.       Home Medications Prior to Admission medications   Medication Sig Start Date End Date Taking? Authorizing Provider  baclofen (LIORESAL) 10 MG tablet Take 0.5 tablets (5 mg total) by mouth 3 (three) times daily. 10/07/21   Derek Jack, MD  benztropine (COGENTIN) 0.5 MG tablet Take 1 tablet (0.5 mg total) by mouth daily. 10/19/21   Roxan Hockey, MD  dicyclomine (BENTYL) 10 MG capsule Take 1 capsule (10 mg total) by mouth 3 (three) times daily as needed for spasms. Patient taking differently: Take 10 mg by mouth 3 (three) times daily before meals. 05/13/21   Derek Jack, MD  lidocaine-prilocaine (EMLA) cream Apply 1 Application topically as needed. 10/11/21   Derek Jack, MD  LORazepam (ATIVAN) 0.5 MG tablet Take 1 tablet (0.5 mg total) by mouth 2 (two) times daily before a meal. 10/18/21   Emokpae, Courage, MD  megestrol (MEGACE) 400 MG/10ML suspension Take 10 mLs (400 mg total) by mouth daily. 10/18/21   Roxan Hockey, MD  nicotine (NICODERM CQ - DOSED IN MG/24 HOURS) 21 mg/24hr patch Place 1 patch (21 mg total) onto the skin daily. 10/19/21   Roxan Hockey, MD  Oxycodone HCl 10 MG TABS Take 1 tablet (10 mg total) by mouth every 8 (eight) hours as needed. 10/22/21   Derek Jack, MD  pantoprazole (PROTONIX) 40 MG tablet Take 1 tablet (40 mg total) by mouth daily. 10/18/21   Roxan Hockey, MD  prochlorperazine (COMPAZINE) 10 MG tablet Take 1 tablet (10 mg total) by mouth every 6 (six) hours as needed for nausea or vomiting. 09/30/21   Derek Jack, MD  QUEtiapine (SEROQUEL) 25 MG tablet Take 1 tablet (25 mg total) by mouth at bedtime. 10/18/21   Roxan Hockey, MD  tadalafil (CIALIS) 10 MG tablet Take 2 tablets (20 mg total) by mouth daily as needed for erectile dysfunction. 08/03/21   Lindell Spar, MD  traZODone (DESYREL) 100 MG tablet Take 1 tablet (100 mg total) by mouth at bedtime. 10/18/21   Roxan Hockey, MD      Allergies    Florinda Marker [id number]]    Review of Systems   Review of Systems  All other systems reviewed and are negative.   Physical Exam Updated Vital Signs BP (!) 114/91 (BP Location: Right Arm)   Pulse 90   Temp 97.6 F (36.4 C) (Oral)   Resp 16   Ht '5\' 6"'$  (1.676 m)   Wt 65.8 kg   SpO2 100%   BMI 23.40 kg/m  Physical Exam Vitals and nursing note reviewed.  Constitutional:      General: He is not in acute distress.    Appearance: He is well-developed. He is not diaphoretic.  HENT:     Head: Normocephalic and atraumatic.  Cardiovascular:     Rate and Rhythm: Normal rate and regular rhythm.     Heart sounds: No murmur heard.    No friction rub.  Pulmonary:     Effort: Pulmonary effort is normal. No respiratory distress.     Breath sounds: Normal breath sounds. No wheezing or rales.  Abdominal:     General: Bowel sounds are normal. There is no distension.     Palpations: Abdomen is soft.     Tenderness: There is abdominal tenderness. There is no right CVA tenderness, left CVA tenderness, guarding or rebound.  Musculoskeletal:        General: Normal range of motion.     Cervical back: Normal range of motion and neck supple.  Skin:    General: Skin is warm and dry.  Neurological:     Mental  Status: He is alert and oriented to person, place, and time.     Coordination: Coordination normal.     ED Results / Procedures / Treatments   Labs (all labs ordered are listed, but only abnormal results are displayed) Labs Reviewed  COMPREHENSIVE METABOLIC PANEL  LIPASE, BLOOD  CBC WITH DIFFERENTIAL/PLATELET    EKG None  Radiology No results found.  Procedures Procedures  {Document cardiac monitor, telemetry assessment procedure when appropriate:1}  Medications Ordered in ED Medications  sodium chloride 0.9 % bolus 1,000 mL (has no administration in time range)  ondansetron (ZOFRAN) injection 4 mg (has no administration in time range)  morphine (PF) 4 MG/ML injection 4 mg (has no administration in time range)    ED Course/ Medical Decision Making/ A&P                           Medical Decision Making Amount and/or Complexity of Data Reviewed Labs: ordered. Radiology: ordered.  Risk Prescription drug management.   ***  {Document critical care time when appropriate:1} {Document review of labs and clinical decision tools ie heart score, Chads2Vasc2 etc:1}  {Document your independent review of radiology images, and any outside records:1} {Document your discussion with family members, caretakers, and with consultants:1} {Document social determinants of health affecting pt's care:1} {Document your decision making why or why not admission, treatments were needed:1} Final Clinical Impression(s) / ED Diagnoses Final diagnoses:  None    Rx / DC Orders ED Discharge Orders     None

## 2021-10-31 NOTE — ED Notes (Signed)
RN went into room to speak with pt and girlfriend, advised that cursing at staff by the girlfriend would not be tolerated and that if it continued then she would be asked to leave, pt and girlfriend both expressed understanding. RN also explained to pt and girlfriend that pt would be getting pain medication and that it may not take away all of the pain and that the staff would be more than happy to ask for more pain medication if the need should arise but that nursing staff could not give medication without any orders. Both expressed understanding.

## 2021-10-31 NOTE — ED Notes (Signed)
Pt became violent cussing this nurse and others at nursing desk. Pt educated that dr is aware that he is in pain and they will be with him as soon as they can

## 2021-11-01 ENCOUNTER — Other Ambulatory Visit: Payer: Self-pay | Admitting: Internal Medicine

## 2021-11-01 ENCOUNTER — Other Ambulatory Visit (HOSPITAL_COMMUNITY): Payer: Self-pay | Admitting: *Deleted

## 2021-11-01 DIAGNOSIS — K5903 Drug induced constipation: Secondary | ICD-10-CM

## 2021-11-01 LAB — COMPREHENSIVE METABOLIC PANEL
ALT: 24 U/L (ref 0–44)
AST: 30 U/L (ref 15–41)
Albumin: 2.4 g/dL — ABNORMAL LOW (ref 3.5–5.0)
Alkaline Phosphatase: 96 U/L (ref 38–126)
Anion gap: 4 — ABNORMAL LOW (ref 5–15)
BUN: 15 mg/dL (ref 8–23)
CO2: 23 mmol/L (ref 22–32)
Calcium: 7.7 mg/dL — ABNORMAL LOW (ref 8.9–10.3)
Chloride: 105 mmol/L (ref 98–111)
Creatinine, Ser: 0.78 mg/dL (ref 0.61–1.24)
GFR, Estimated: >60 mL/min (ref >60)
Glucose, Bld: 93 mg/dL (ref 70–99)
Potassium: 3.9 mmol/L (ref 3.5–5.1)
Sodium: 132 mmol/L — ABNORMAL LOW (ref 135–145)
Total Bilirubin: 0.7 mg/dL (ref 0.3–1.2)
Total Protein: 5.9 g/dL — ABNORMAL LOW (ref 6.5–8.1)

## 2021-11-01 LAB — LIPASE, BLOOD: Lipase: 29 U/L (ref 11–51)

## 2021-11-01 MED ORDER — LACTULOSE 10 GM/15ML PO SOLN: 20.0000 g | Freq: Every evening | ORAL | 0 refills | Status: AC | PRN

## 2021-11-01 NOTE — Discharge Instructions (Addendum)
Continue medications as previously prescribed.  Drink a 10 ounce bottle of magnesium citrate for relief of your constipation.  This medication is available over-the-counter and without a prescription.  Follow-up with your primary doctor if symptoms do not improve.

## 2021-11-02 ENCOUNTER — Other Ambulatory Visit: Payer: Self-pay | Admitting: *Deleted

## 2021-11-02 MED ORDER — LORAZEPAM 0.5 MG PO TABS: 0.5000 mg | ORAL_TABLET | Freq: Two times a day (BID) | ORAL | 0 refills | Status: DC

## 2021-11-06 ENCOUNTER — Other Ambulatory Visit (HOSPITAL_COMMUNITY): Payer: Self-pay | Admitting: Hematology

## 2021-11-06 DIAGNOSIS — R066 Hiccough: Secondary | ICD-10-CM

## 2021-11-08 ENCOUNTER — Telehealth: Payer: Self-pay | Admitting: *Deleted

## 2021-11-08 ENCOUNTER — Encounter: Payer: Self-pay | Admitting: Hematology

## 2021-11-08 NOTE — Telephone Encounter (Signed)
        Patient  visited [place] ed on 10/31/2021  for abdominal pain    Telephone encounter attempt :  1  A HIPAA compliant voice message was left requesting a return call.  Instructed patient to call back at [phone]. [place] [phone] [address] , [place] [address] Email : Ashby [email or web address]. [email or web address]

## 2021-11-09 ENCOUNTER — Encounter: Payer: Self-pay | Admitting: *Deleted

## 2021-11-09 ENCOUNTER — Telehealth: Payer: Self-pay | Admitting: *Deleted

## 2021-11-09 ENCOUNTER — Other Ambulatory Visit: Payer: Self-pay | Admitting: *Deleted

## 2021-11-09 DIAGNOSIS — K5903 Drug induced constipation: Secondary | ICD-10-CM

## 2021-11-09 MED ORDER — OXYCODONE HCL 20 MG PO TABS: 20.0000 mg | ORAL_TABLET | Freq: Three times a day (TID) | ORAL | 0 refills | Status: DC

## 2021-11-09 NOTE — Progress Notes (Signed)
Palliative Care Consult made to [facility] per patient request.  Future appts for labs and treatment cancelled as well.  Patient aware to expect a call within 24-48 hours.  He is requesting morphine for pain, however, per Dr. Delton Coombes Oxycodone will be increased to 20 mg from 10 mg at this time and he will talk to him at appointment on Thursday.

## 2021-11-09 NOTE — Telephone Encounter (Signed)
        Patient  visited [place] on 11/01/2021  for pain    Telephone encounter attempt :  2nd  A HIPAA compliant voice message was left requesting a return call.  Instructed patient to call back at [phone].  [place] [facility] [phone] [address] , [place] [address] Email : Ashby [email or web address]. [email or web address]

## 2021-11-09 NOTE — Progress Notes (Signed)
Patient's wife sent a [facility] requesting Palliative Care Consult for patient.  Currently [facility] is 4 weeks out on seeing patients at this point.  We will locate an alternative resource for him today and make a referral.

## 2021-11-10 ENCOUNTER — Ambulatory Visit: Payer: Medicare Other

## 2021-11-10 ENCOUNTER — Telehealth: Payer: Self-pay | Admitting: Nurse Practitioner

## 2021-11-10 NOTE — Telephone Encounter (Signed)
Rec'd message from patient's wife, ret'd her call to offer to schedule a Palliative Consult, no answer - left message requesting a return call to schdule

## 2021-11-11 ENCOUNTER — Other Ambulatory Visit: Payer: Self-pay | Admitting: *Deleted

## 2021-11-11 ENCOUNTER — Telehealth: Payer: Self-pay | Admitting: *Deleted

## 2021-11-11 ENCOUNTER — Inpatient Hospital Stay: Payer: Medicare Other

## 2021-11-11 ENCOUNTER — Inpatient Hospital Stay: Payer: Medicare Other | Attending: Hematology | Admitting: Hematology

## 2021-11-11 VITALS — BP 117/77 | HR 70 | Temp 97.7°F | Resp 18 | Ht 65.0 in | Wt 130.0 lb

## 2021-11-11 DIAGNOSIS — R634 Abnormal weight loss: Secondary | ICD-10-CM | POA: Diagnosis not present

## 2021-11-11 DIAGNOSIS — G629 Polyneuropathy, unspecified: Secondary | ICD-10-CM | POA: Insufficient documentation

## 2021-11-11 DIAGNOSIS — N189 Chronic kidney disease, unspecified: Secondary | ICD-10-CM | POA: Diagnosis not present

## 2021-11-11 DIAGNOSIS — F419 Anxiety disorder, unspecified: Secondary | ICD-10-CM | POA: Insufficient documentation

## 2021-11-11 DIAGNOSIS — C679 Malignant neoplasm of bladder, unspecified: Secondary | ICD-10-CM | POA: Insufficient documentation

## 2021-11-11 DIAGNOSIS — C787 Secondary malignant neoplasm of liver and intrahepatic bile duct: Secondary | ICD-10-CM | POA: Insufficient documentation

## 2021-11-11 DIAGNOSIS — K5900 Constipation, unspecified: Secondary | ICD-10-CM | POA: Insufficient documentation

## 2021-11-11 DIAGNOSIS — R1012 Left upper quadrant pain: Secondary | ICD-10-CM | POA: Insufficient documentation

## 2021-11-11 DIAGNOSIS — I129 Hypertensive chronic kidney disease with stage 1 through stage 4 chronic kidney disease, or unspecified chronic kidney disease: Secondary | ICD-10-CM | POA: Insufficient documentation

## 2021-11-11 DIAGNOSIS — F17210 Nicotine dependence, cigarettes, uncomplicated: Secondary | ICD-10-CM | POA: Diagnosis not present

## 2021-11-11 DIAGNOSIS — C7910 Secondary malignant neoplasm of unspecified urinary organs: Secondary | ICD-10-CM

## 2021-11-11 MED ORDER — LORAZEPAM 0.5 MG PO TABS: 0.5000 mg | ORAL_TABLET | Freq: Two times a day (BID) | ORAL | 0 refills | Status: DC

## 2021-11-11 MED ORDER — NALOXEGOL OXALATE 25 MG PO TABS: 25.0000 mg | ORAL_TABLET | Freq: Every day | ORAL | 6 refills | Status: AC

## 2021-11-11 NOTE — Patient Instructions (Addendum)
[facility] Discharge Instructions   You were seen and examined today by Dr. Katragadda.     Thank you for choosing  [facility] at [facility] to provide your oncology and hematology care.  To afford each patient quality time with our provider, please arrive at least 15 minutes before your scheduled appointment time.   If you have a lab appointment with the Cancer Center please come in thru the Main Entrance and check in at the main information desk.  You need to re-schedule your appointment should you arrive 10 or more minutes late.  We strive to give you quality time with our providers, and arriving late affects you and other patients whose appointments are after yours.  Also, if you no show three or more times for appointments you may be dismissed from the clinic at the providers discretion.     Again, thank you for choosing [facility].  Our hope is that these requests will decrease the amount of time that you wait before being seen by our physicians.       _____________________________________________________________  Should you have questions after your visit to [facility], please contact our office at [phone] and follow the prompts.  Our office hours are 8:00 a.m. and 4:30 p.m. Monday - Friday.  Please note that voicemails left after 4:00 p.m. may not be returned until the following business day.  We are closed weekends and major holidays.  You do have access to a nurse 24-7, just call the main number to the clinic [phone] and do not press any options, hold on the line and a nurse will answer the phone.    For prescription refill requests, have your pharmacy contact our office and allow 72 hours.    Due to Covid, you will need to wear a mask upon entering the hospital. If you do not have a mask, a mask will be given to you at the Main Entrance upon arrival. For doctor visits, patients may  have 1 support person age 18 or older with them. For treatment visits, patients can not have anyone with them due to social distancing guidelines and our immunocompromised population.      

## 2021-11-11 NOTE — Telephone Encounter (Signed)
     Patient  visit on 10/31/2021 at  [facility]   was for belly pain  Have you been able to follow up with your primary care physician?  yes  The patient was able to obtain any needed medicine or equipment.  Are there diet recommendations that you are having difficulty following?  NA  Patient expresses understanding of discharge instructions and education provided has no other needs at this time.    [place] [phone] [address] , [place] [address] Email : [email or web address]

## 2021-11-11 NOTE — Progress Notes (Signed)
[facility] [address], [place] [address]   CLINIC:  Medical Oncology/Hematology  PCP:  Lindell Spar, MD [address] / [place] [address] [phone]   REASON FOR VISIT:  Follow-up for metastatic urothelial carcinoma  PRIOR THERAPY:  Keytruda every 3 weeks Enfortumab every 4 weeks Sacituzumab govitecan-hziy [name]  NGS Results: not done  CURRENT THERAPY: Carboplatin D1 / Gemcitabine D1,8 q21d  BRIEF ONCOLOGIC HISTORY:  Oncology History  Metastatic urothelial carcinoma ([place])  08/13/2020 Initial Diagnosis   Metastatic urothelial carcinoma ([place])   08/13/2020 Cancer Staging   Staging form: Urinary Bladder, AJCC 8th Edition - Clinical stage from 08/13/2020: Stage IVB (cTX, cN0, pM1b) - Signed by Derek Jack, MD on 08/13/2020 Stage prefix: Initial diagnosis WHO/ISUP grade (low/high): High Grade Histologic grading system: 2 grade system   08/20/2020 - 10/22/2020 Chemotherapy         11/19/2020 - 01/28/2021 Chemotherapy   Patient is on Treatment Plan : UROTHELIAL LOCALLY ADVANCED / METASTATIC Enfortumab q28d     02/15/2021 - 07/21/2021 Chemotherapy   Patient is on Treatment Plan : BLADDER Sacituzumab govitecan-hziy Ivette Loyal) q21d     09/30/2021 -  Chemotherapy   Patient is on Treatment Plan : BLADDER Carboplatin D1 / Gemcitabine D1,8 q21d       CANCER STAGING:  Cancer Staging  Metastatic urothelial carcinoma ([place]) Staging form: Urinary Bladder, AJCC 8th Edition - Clinical stage from 08/13/2020: Stage IVB (cTX, cN0, pM1b) - Signed by Derek Jack, MD on 08/13/2020   INTERVAL HISTORY:  [name], a 71 y.o. male, returns for follow-up of metastatic urothelial cancer.  In the interim he was hospitalized and I have reviewed all his records.  Cycle 2-day 1 of chemotherapy was on 10/28/2021.  He did not show up for day 8 of gemcitabine.  He reported that he is getting severely constipated whenever  he gets chemotherapy which causes his abdominal pain to get worse for the next 4 to 5 days.  He cannot take more than 1 pill of oxycodone due to constipation.  He was recently evaluated in the ER for constipation.  A CT scan was done.   REVIEW OF SYSTEMS:  Review of Systems  Constitutional:  Negative for appetite change and fatigue.  Respiratory:  Negative for cough.   Cardiovascular:  Negative for chest pain.  Gastrointestinal:  Positive for abdominal pain (6/10 L side) and constipation. Negative for nausea.  Neurological:  Negative for headaches and numbness.  All other systems reviewed and are negative.   PAST MEDICAL/SURGICAL HISTORY:  Past Medical History:  Diagnosis Date   Chronic kidney disease    GERD (gastroesophageal reflux disease)    Hypertension    Medical history non-contributory    Past Surgical History:  Procedure Laterality Date   appendectomy     PORTACATH PLACEMENT Right 05/26/2021   Procedure: INSERTION PORT-A-CATH- IJ;  Surgeon: Rusty Aus, DO;  Location: [facility];  Service: General;  Laterality: Right;  Internal Jugular     SOCIAL HISTORY:  Social History   Socioeconomic History   Marital status: Married    Spouse name: Not on file   Number of children: Not on file   Years of education: Not on file   Highest education level: Not on file  Occupational History   Not on file  Tobacco Use   Smoking status: Some Days   Smokeless tobacco: Current   Tobacco comments:    a few cigarettes  a day  Vaping Use   Vaping Use: Never used  Substance and Sexual Activity   Alcohol use: Yes    Comment: occ   Drug use: Yes   Sexual activity: Not on file  Other Topics Concern   Not on file  Social History Narrative   Not on file   Social Determinants of Health   Financial Resource Strain: Low Risk  (11/09/2020)   Overall Financial Resource Strain (CARDIA)    Difficulty of Paying Living Expenses: Not hard at all  Food Insecurity: No Food Insecurity  (11/09/2020)   Hunger Vital Sign    Worried About Running Out of Food in the Last Year: Never true    Ran Out of Food in the Last Year: Never true  Transportation Needs: No Transportation Needs (11/09/2020)   PRAPARE - Hydrologist (Medical): No    Lack of Transportation (Non-Medical): No  Physical Activity: Unknown (11/09/2020)   Exercise Vital Sign    Days of Exercise per Week: 5 days    Minutes of Exercise per Session: Not on file  Stress: No Stress Concern Present (11/09/2020)   [place]    Feeling of Stress : Not at all  Social Connections: Moderately Isolated (11/09/2020)   Social Connection and Isolation Panel [NHANES]    Frequency of Communication with Friends and Family: More than three times a week    Frequency of Social Gatherings with Friends and Family: More than three times a week    Attends Religious Services: More than 4 times per year    Active Member of [employer] or Organizations: No    Attends Archivist Meetings: Never    Marital Status: Divorced  Human resources officer Violence: Not At Risk (11/09/2020)   Humiliation, Afraid, Rape, and Kick questionnaire    Fear of Current or Ex-Partner: No    Emotionally Abused: No    Physically Abused: No    Sexually Abused: No    FAMILY HISTORY:  Family History  Problem Relation Age of Onset   Colon cancer Neg Hx    Colon polyps Neg Hx    Liver cancer Neg Hx    Pancreatic cancer Neg Hx     CURRENT MEDICATIONS:  Current Outpatient Medications  Medication Sig Dispense Refill   baclofen (LIORESAL) 10 MG tablet TAKE 1/2 (ONE-HALF) TABLET BY MOUTH THREE TIMES DAILY 60 tablet 0   benztropine (COGENTIN) 0.5 MG tablet Take 1 tablet (0.5 mg total) by mouth daily. 30 tablet 2   dicyclomine (BENTYL) 10 MG capsule Take 1 capsule (10 mg total) by mouth 3 (three) times daily as needed for spasms. (Patient taking differently: Take 10 mg by mouth 3  (three) times daily before meals.) 90 capsule 6   lactulose (CHRONULAC) 10 GM/15ML solution Take 30 mLs (20 g total) by mouth at bedtime as needed for mild constipation. 473 mL 0   lidocaine-prilocaine (EMLA) cream Apply 1 Application topically as needed. 30 g 0   megestrol (MEGACE) 400 MG/10ML suspension Take 10 mLs (400 mg total) by mouth daily. 480 mL 3   naloxegol oxalate (MOVANTIK) 25 MG TABS tablet Take 1 tablet (25 mg total) by mouth daily. 30 tablet 6   nicotine (NICODERM CQ - DOSED IN MG/24 HOURS) 21 mg/24hr patch Place 1 patch (21 mg total) onto the skin daily. 28 patch 0   Oxycodone HCl 20 MG TABS Take 1 tablet (20 mg total) by mouth  every 8 (eight) hours. 90 tablet 0   pantoprazole (PROTONIX) 40 MG tablet Take 1 tablet (40 mg total) by mouth daily. 30 tablet 6   prochlorperazine (COMPAZINE) 10 MG tablet Take 1 tablet (10 mg total) by mouth every 6 (six) hours as needed for nausea or vomiting. 30 tablet 3   tadalafil (CIALIS) 10 MG tablet Take 2 tablets (20 mg total) by mouth daily as needed for erectile dysfunction. 30 tablet 3   traZODone (DESYREL) 100 MG tablet Take 1 tablet (100 mg total) by mouth at bedtime. 30 tablet 2   LORazepam (ATIVAN) 0.5 MG tablet Take 1 tablet (0.5 mg total) by mouth 2 (two) times daily before a meal. 60 tablet 0   No current facility-administered medications for this visit.   Facility-Administered Medications Ordered in Other Visits  Medication Dose Route Frequency Provider Last Rate Last Admin   acetaminophen (TYLENOL) 325 MG tablet            famotidine (PEPCID) 20-0.9 MG/50ML-% IVPB            palonosetron (ALOXI) 0.25 MG/5ML injection            sodium chloride flush (NS) 0.9 % injection 10 mL  10 mL Intravenous PRN Derek Jack, MD   10 mL at 08/04/21 1030    ALLERGIES:  Allergies  Allergen Reactions   Ivette Loyal [Sacituzumab Govitecan-Hziy] Other (See Comments)    See notes from 07/14/21 and 07/21/21        PHYSICAL EXAM:   Performance status (ECOG): 1 - Symptomatic but completely ambulatory  Vitals:   11/11/21 1030  BP: 117/77  Pulse: 70  Resp: 18  Temp: 97.7 F (36.5 C)  SpO2: 99%   Wt Readings from Last 3 Encounters:  11/11/21 130 lb (59 kg)  10/31/21 145 lb (65.8 kg)  10/28/21 138 lb 3.2 oz (62.7 kg)   Physical Exam Vitals reviewed.  Constitutional:      Appearance: Normal appearance.  Cardiovascular:     Rate and Rhythm: Normal rate and regular rhythm.     Pulses: Normal pulses.     Heart sounds: Normal heart sounds.  Pulmonary:     Effort: Pulmonary effort is normal.     Breath sounds: Normal breath sounds.  Neurological:     General: No focal deficit present.     Mental Status: He is alert and oriented to person, place, and time.  Psychiatric:        Mood and Affect: Mood normal.        Behavior: Behavior normal.     LABORATORY DATA:  I have reviewed the labs as listed.     Latest Ref Rng & Units 10/31/2021   11:37 PM 10/28/2021    8:21 AM 10/17/2021    6:02 AM  CBC  WBC 4.0 - 10.5 K/uL 6.5  8.6  4.1   Hemoglobin 13.0 - 17.0 g/dL 8.7  9.8  8.9   Hematocrit 39.0 - 52.0 % 25.9  29.9  26.7   Platelets 150 - 400 K/uL 276  456  191       Latest Ref Rng & Units 10/31/2021   11:37 PM 10/28/2021    8:21 AM 10/17/2021    6:02 AM  CMP  Glucose 70 - 99 mg/dL 93  155  130   BUN 8 - 23 mg/dL '15  11  7   ' Creatinine 0.61 - 1.24 mg/dL 0.78  1.11  0.73   Sodium 135 - 145 mmol/L  132  133  132   Potassium 3.5 - 5.1 mmol/L 3.9  3.8  3.6   Chloride 98 - 111 mmol/L 105  104  108   CO2 22 - 32 mmol/L '23  22  18   ' Calcium 8.9 - 10.3 mg/dL 7.7  8.6  8.0   Total Protein 6.5 - 8.1 g/dL 5.9  6.5    Total Bilirubin 0.3 - 1.2 mg/dL 0.7  0.5    Alkaline Phos 38 - 126 U/L 96  111    AST 15 - 41 U/L 30  19    ALT 0 - 44 U/L 24  16      DIAGNOSTIC IMAGING:  I have independently reviewed the scans and discussed with the patient.    ASSESSMENT:  1. 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 - 20 pound weight loss in the last 4 months due to decreased appetite.  Left upper quadrant pain for the past 2 to 3 months. - CTAP with contrast on 07/10/2020 showed ill-defined low-density mass in the right hepatic lobe measuring 2 x 2 cm.  Left upper pole kidney mass measuring 7.6 x 5.9 cm with encasement and narrowing of the left renal artery and vein.  No intravascular thrombus noted.  Mass encases the left adrenal gland.  Small right adrenal nodule measuring 11 mm, likely benign.  No other evidence of metastatic disease. - MRI of the liver with and without contrast on 07/20/2020 shows 2.2 x 1.7 cm enhancing lesion in the medial aspect of the segment 7, suspicious for metastasis.  Left renal mass, 6 x 7.6 x 6.9 cm infiltrating enhancing left upper pole renal mass, extending into the left renal sinus.  Possible TCC or lymphoma rather than renal cell carcinoma. - CT chest on 07/20/2020 with no evidence of metastatic disease. - PET scan on 08/04/2020 showed large hypermetabolic left upper pole mass and 2 hypermetabolic lesions in the right hepatic lobe favoring metastatic disease.  No hypermetabolic adenopathy.  - Liver biopsy showed poorly differentiated metastatic urothelial carcinoma.  IHC positive for CK5/6, CK7, GATA3, p40 and patchy positivity with p63 and PAX8. - He refused any chemotherapy. - 4 cycles of pembrolizumab from 08/20/2020 through 10/22/2020 with progression. - NGS testing showed benefit from pembrolizumab.  No mutations involving FGFR, NTRK was found.  Test was limited due to small sample. - CT CAP on 11/11/2020 showed interval progression of liver metastasis.  No significant change infiltrative mass arising in the upper pole of the left kidney invading the medial aspect of the upper spleen, left renal vein and left adrenal gland with soft tissue infiltration to the left  retroperitoneal fat.  No evidence of metastatic disease to the chest. - Progression on 3 cycles of enfortumab vedotin. - CT CAP on 02/15/2021 showed decrease in size of the left renal mass.  Interval progression of numerous liver metastasis. - Guardant 360 results-KRAS G12D, PDGFRA T230T, DDR2 [id number], MSI high not detected. - 8 cycles of sacituzumab govitecan completed on 07/14/2021, discontinued secondary to allergic reactions. - Carboplatin and gemcitabine cycle 1 started on 09/30/2021.  Carboplatin in place of cisplatin was chosen to maintain quality of life.   2.  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 - Reports exposure to carbon black dust.  Smoked half pack per day for 30 years, currently smoking 2 to 3  cigarettes/day. - No family history of malignancy.   PLAN:  1. Metastatic poorly differentiated urothelial carcinoma arising in the renal pelvis: - Cycle 2-day 1 of carboplatin and gemcitabine on 10/28/2021. - I have reviewed CTAP with contrast (10/31/2021): Unchanged mass in the left kidney and liver metastasis compared to scan from 09/21/2021. - I have reviewed recent hospitalization records. - He does not want any treatment as it is causing more constipation and abdominal pain. - I had talked to him about hospice care.  He is seeing a palliative care nurse and will transition to hospice should the need arise. - RTC as needed.     2.  Left upper quadrant pain: - He is taking oxycodone 10 mg at bedtime. - I have sent a prescription for oxycodone 20 mg and to take it more frequently..  3.  Constipation: - He is taking senna daily.  He had to have 3 enemas last week. - I have sent a prescription for Movantik 25 mg to be taken daily.  4.  Sleeping difficulty: - Continue trazodone 100 mg at bedtime as needed.  5.  Neuropathy: - Numbness in the bottom of the toes and fingertips has been stable.  6.  Anxiety: - I have given a  prescription for Ativan twice daily as needed.  7.  Weight loss: - Continue Megace daily.   Orders placed this encounter:  No orders of the defined types were placed in this encounter.    Derek Jack, MD [place] [phone]

## 2021-11-15 ENCOUNTER — Other Ambulatory Visit: Payer: Self-pay | Admitting: *Deleted

## 2021-11-15 ENCOUNTER — Ambulatory Visit (INDEPENDENT_AMBULATORY_CARE_PROVIDER_SITE_OTHER): Payer: Medicare Other

## 2021-11-15 DIAGNOSIS — R066 Hiccough: Secondary | ICD-10-CM

## 2021-11-15 DIAGNOSIS — Z0000 Encounter for general adult medical examination without abnormal findings: Secondary | ICD-10-CM

## 2021-11-15 MED ORDER — BACLOFEN 10 MG PO TABS: 10.0000 mg | ORAL_TABLET | Freq: Three times a day (TID) | ORAL | 0 refills | Status: DC

## 2021-11-15 NOTE — Patient Instructions (Signed)
  Jeremy Rodriguez , Thank you for taking time to come for your Medicare Wellness Visit. I appreciate your ongoing commitment to your health goals. Please review the following plan we discussed and let me know if I can assist you in the future.   These are the goals we discussed:  Goals      Patient Stated     He would just like to get well     Patient Stated     Get better.        This is a list of the screening recommended for you and due dates:  Health Maintenance  Topic Date Due   Pneumonia Vaccine (1 - PCV) Never done   Tetanus Vaccine  Never done   Zoster (Shingles) Vaccine (1 of 2) Never done   Colon Cancer Screening  Never done   COVID-19 Vaccine (3 - Moderna risk series) 07/12/2019   Flu Shot  01/01/2022*   Hepatitis C Screening: USPSTF Recommendation to screen - Ages 18-79 yo.  Completed   HPV Vaccine  Aged Out  *Topic was postponed. The date shown is not the original due date.

## 2021-11-15 NOTE — Progress Notes (Signed)
Subjective:   Jeremy Rodriguez is a 71 y.o. male who presents for Medicare Annual/Subsequent preventive examination.  Review of Systems    I connected with  Jeremy Rodriguez on 11/15/21 by a audio enabled telemedicine application and verified that I am speaking with the correct person using two identifiers.  Patient Location: Home  Provider Location: Office/Clinic  I discussed the limitations of evaluation and management by telemedicine. The patient expressed understanding and agreed to proceed.        Objective:    There were no vitals filed for this visit. There is no height or weight on file to calculate BMI.     11/11/2021   10:29 AM 10/31/2021    9:21 PM 10/28/2021   12:20 PM 10/13/2021    3:41 PM 10/07/2021    9:40 AM 09/30/2021   10:15 AM 09/30/2021    9:24 AM  Advanced Directives  Does Patient Have a Medical Advance Directive? No No No  No No No  Would patient like information on creating a medical advance directive? No - Patient declined  No - Patient declined  No - Patient declined No - Patient declined No - Patient declined     Information is confidential and restricted. Go to Review Flowsheets to unlock data.     Current Medications (verified) Outpatient Encounter Medications as of 11/15/2021  Medication Sig   benztropine (COGENTIN) 0.5 MG tablet Take 1 tablet (0.5 mg total) by mouth daily.   dicyclomine (BENTYL) 10 MG capsule Take 1 capsule (10 mg total) by mouth 3 (three) times daily as needed for spasms. (Patient taking differently: Take 10 mg by mouth 3 (three) times daily before meals.)   lactulose (CHRONULAC) 10 GM/15ML solution Take 30 mLs (20 g total) by mouth at bedtime as needed for mild constipation.   lidocaine-prilocaine (EMLA) cream Apply 1 Application topically as needed.   LORazepam (ATIVAN) 0.5 MG tablet Take 1 tablet (0.5 mg total) by mouth 2 (two) times daily before a meal.   megestrol (MEGACE) 400 MG/10ML suspension Take 10 mLs (400 mg total)  by mouth daily.   naloxegol oxalate (MOVANTIK) 25 MG TABS tablet Take 1 tablet (25 mg total) by mouth daily.   nicotine (NICODERM CQ - DOSED IN MG/24 HOURS) 21 mg/24hr patch Place 1 patch (21 mg total) onto the skin daily.   Oxycodone HCl 20 MG TABS Take 1 tablet (20 mg total) by mouth every 8 (eight) hours.   pantoprazole (PROTONIX) 40 MG tablet Take 1 tablet (40 mg total) by mouth daily.   prochlorperazine (COMPAZINE) 10 MG tablet Take 1 tablet (10 mg total) by mouth every 6 (six) hours as needed for nausea or vomiting.   tadalafil (CIALIS) 10 MG tablet Take 2 tablets (20 mg total) by mouth daily as needed for erectile dysfunction.   traZODone (DESYREL) 100 MG tablet Take 1 tablet (100 mg total) by mouth at bedtime.   Facility-Administered Encounter Medications as of 11/15/2021  Medication   acetaminophen (TYLENOL) 325 MG tablet   famotidine (PEPCID) 20-0.9 MG/50ML-% IVPB   palonosetron (ALOXI) 0.25 MG/5ML injection   sodium chloride flush (NS) 0.9 % injection 10 mL    Allergies (verified) Florinda Marker govitecan-hziy]   History: Past Medical History:  Diagnosis Date   Chronic kidney disease    GERD (gastroesophageal reflux disease)    Hypertension    Medical history non-contributory    Past Surgical History:  Procedure Laterality Date   appendectomy     PORTACATH PLACEMENT Right  05/26/2021   Procedure: INSERTION PORT-A-CATH- IJ;  Surgeon: Rusty Aus, DO;  Location: [facility];  Service: General;  Laterality: Right;  Internal Jugular    Family History  Problem Relation Age of Onset   Colon cancer Neg Hx    Colon polyps Neg Hx    Liver cancer Neg Hx    Pancreatic cancer Neg Hx    Social History   Socioeconomic History   Marital status: Married    Spouse name: Not on file   Number of children: Not on file   Years of education: Not on file   Highest education level: Not on file  Occupational History   Not on file  Tobacco Use   Smoking status: Some  Days   Smokeless tobacco: Current   Tobacco comments:    a few cigarettes a day  Vaping Use   Vaping Use: Never used  Substance and Sexual Activity   Alcohol use: Yes    Comment: occ   Drug use: Yes   Sexual activity: Not on file  Other Topics Concern   Not on file  Social History Narrative   Not on file   Social Determinants of Health   Financial Resource Strain: Low Risk  (11/09/2020)   Overall Financial Resource Strain (CARDIA)    Difficulty of Paying Living Expenses: Not hard at all  Food Insecurity: No Food Insecurity (11/09/2020)   Hunger Vital Sign    Worried About Running Out of Food in the Last Year: Never true    [place] in the Last Year: Never true  Transportation Needs: No Transportation Needs (11/09/2020)   PRAPARE - Hydrologist (Medical): No    Lack of Transportation (Non-Medical): No  Physical Activity: Unknown (11/09/2020)   Exercise Vital Sign    Days of Exercise per Week: 5 days    Minutes of Exercise per Session: Not on file  Stress: No Stress Concern Present (11/09/2020)   [place]    Feeling of Stress : Not at all  Social Connections: Moderately Isolated (11/09/2020)   Social Connection and Isolation Panel [NHANES]    Frequency of Communication with Friends and Family: More than three times a week    Frequency of Social Gatherings with Friends and Family: More than three times a week    Attends Religious Services: More than 4 times per year    Active Member of [employer] or Organizations: No    Attends Archivist Meetings: Never    Marital Status: Divorced    Tobacco Counseling Ready to quit: Not Answered Counseling given: Not Answered Tobacco comments: a few cigarettes a day   Clinical Intake:   Diabetic?NO   Activities of Daily Living    10/14/2021    1:19 AM 05/21/2021    1:35 PM  In your present state of health, do you have any  difficulty performing the following activities:  Hearing?    Vision?    Difficulty concentrating or making decisions?    Walking or climbing stairs?    Dressing or bathing?    Doing errands, shopping?  0     Information is confidential and restricted. Go to Review Flowsheets to unlock data.     Patient Care Team: Lindell Spar, MD as PCP - General (Internal Medicine) Eloise Harman, DO as Consulting Physician (Internal Medicine) Derek Jack, MD as Medical Oncologist (Medical Oncology) Brien Mates, RN as  Oncology Nurse Navigator (Medical Oncology)  Indicate any recent Medical Services you may have received from other than [facility] providers in the past year (date may be approximate).     Assessment:   This is a routine wellness examination for [place].  Hearing/Vision screen No results found.  Dietary issues and exercise activities discussed:     Goals Addressed   None   Depression Screen    03/17/2021   11:41 AM 11/09/2020    3:08 PM 11/09/2020    3:04 PM 07/15/2020    3:08 PM 07/01/2020    1:00 PM 06/24/2020    3:52 PM  PHQ 2/9 Scores  PHQ - 2 Score 0 0 0 0 0 0    Fall Risk    05/19/2021    2:07 PM 03/17/2021   11:41 AM 11/09/2020    3:08 PM 07/15/2020    3:08 PM 07/01/2020    1:00 PM  [place] in the past year? 0 0 0 0 0  Number falls in past yr:  0 0 0 0  Injury with Fall?  0 0 0 0  Risk for fall due to :  No Fall Risks No Fall Risks No Fall Risks No Fall Risks  Follow up Falls evaluation completed Falls evaluation completed Falls evaluation completed Falls evaluation completed Falls evaluation completed    [place]:  Any stairs in or around the home? Yes  If so, are there any without handrails? Yes  Home free of loose throw rugs in walkways, pet beds, electrical cords, etc? Yes  Adequate lighting in your home to reduce risk of falls? Yes   ASSISTIVE DEVICES UTILIZED TO PREVENT FALLS:  Life  alert? No  Use of a cane, walker or w/c? No  Grab bars in the bathroom? Yes  Shower chair or bench in shower? No  Elevated toilet seat or a handicapped toilet? No     Cognitive Function:    11/09/2020    3:09 PM  MMSE - Mini Mental State Exam  Not completed: Unable to complete        11/09/2020    3:09 PM  6CIT Screen  What Year? 0 points  What month? 0 points  What time? 0 points  Count back from 20 0 points  Months in reverse 0 points  Repeat phrase 0 points  Total Score 0 points    Immunizations Immunization History  Administered Date(s) Administered   Moderna Sars-Covid-2 Vaccination 05/17/2019, 06/14/2019    TDAP status: Due, Education has been provided regarding the importance of this vaccine. Advised may receive this vaccine at local pharmacy or Health Dept. Aware to provide a copy of the vaccination record if obtained from local pharmacy or Health Dept. Verbalized acceptance and understanding.  Flu Vaccine status: Due, Education has been provided regarding the importance of this vaccine. Advised may receive this vaccine at local pharmacy or Health Dept. Aware to provide a copy of the vaccination record if obtained from local pharmacy or Health Dept. Verbalized acceptance and understanding.  Pneumococcal vaccine status: Due, Education has been provided regarding the importance of this vaccine. Advised may receive this vaccine at local pharmacy or Health Dept. Aware to provide a copy of the vaccination record if obtained from local pharmacy or Health Dept. Verbalized acceptance and understanding.  Covid-19 vaccine status: Completed vaccines  Qualifies for Shingles Vaccine? Yes   Zostavax completed No   Shingrix Completed?: No.    Education  has been provided regarding the importance of this vaccine. Patient has been advised to call insurance company to determine out of pocket expense if they have not yet received this vaccine. Advised may also receive vaccine at local  pharmacy or Health Dept. Verbalized acceptance and understanding.  Screening Tests Health Maintenance  Topic Date Due   Pneumonia Vaccine 97+ Years old (1 - PCV) Never done   TETANUS/TDAP  Never done   Zoster Vaccines- Shingrix (1 of 2) Never done   COLONOSCOPY (Pts 45-65yr Insurance coverage will need to be confirmed)  Never done   COVID-19 Vaccine (3 - Moderna risk series) 07/12/2019   INFLUENZA VACCINE  01/01/2022 (Originally 11/02/2021)   Hepatitis C Screening  Completed   HPV VACCINES  Aged Out    Health Maintenance  Health Maintenance Due  Topic Date Due   Pneumonia Vaccine 71 Years old (1 - PCV) Never done   TETANUS/TDAP  Never done   Zoster Vaccines- Shingrix (1 of 2) Never done   COLONOSCOPY (Pts 45-430yrInsurance coverage will need to be confirmed)  Never done   COVID-19 Vaccine (3 - Moderna risk series) 07/12/2019    Colorectal cancer screening: Referral to GI placed  . Pt aware the office will call re: appt.  Lung Cancer Screening: (Low Dose CT Chest recommended if Age 71-80ears, 30 pack-year currently smoking OR have quit w/in 15years.) does not qualify.   Lung Cancer Screening Referral: NO  Additional Screening:  Hepatitis C Screening: does qualify; Completed 07/15/20  Vision Screening: Recommended annual ophthalmology exams for early detection of glaucoma and other disorders of the eye. Is the patient up to date with their annual eye exam?  No  Who is the provider or what is the name of the office in which the patient attends annual eye exams? N/a If pt is not established with a provider, would they like to be referred to a provider to establish care? No .   Dental Screening: Recommended annual dental exams for proper oral hygiene  Community Resource Referral / Chronic Care Management: CRR required this visit?  No   CCM required this visit?  No      Plan:     I have personally reviewed and noted the following in the patient's chart:   Medical  and social history Use of alcohol, tobacco or illicit drugs  Current medications and supplements including opioid prescriptions. Patient is currently taking opioid prescriptions. Information provided to patient regarding non-opioid alternatives. Patient advised to discuss non-opioid treatment plan with their provider. Functional ability and status Nutritional status Physical activity Advanced directives List of other physicians Hospitalizations, surgeries, and ER visits in previous 12 months Vitals Screenings to include cognitive, depression, and falls Referrals and appointments  In addition, I have reviewed and discussed with patient certain preventive protocols, quality metrics, and best practice recommendations. A written personalized care plan for preventive services as well as general preventive health recommendations were provided to patient.     KaQuentin AngstCMElkton 11/15/2021

## 2021-11-16 ENCOUNTER — Encounter: Payer: Self-pay | Admitting: Hematology

## 2021-11-16 MED ORDER — BACLOFEN 10 MG PO TABS: 10.0000 mg | ORAL_TABLET | Freq: Three times a day (TID) | ORAL | 0 refills | Status: AC

## 2021-11-17 ENCOUNTER — Telehealth: Payer: Medicare Other | Admitting: Nurse Practitioner

## 2021-11-17 ENCOUNTER — Other Ambulatory Visit: Payer: Self-pay

## 2021-11-17 ENCOUNTER — Encounter: Payer: Self-pay | Admitting: Internal Medicine

## 2021-11-17 DIAGNOSIS — T402X5A Adverse effect of other opioids, initial encounter: Secondary | ICD-10-CM | POA: Diagnosis not present

## 2021-11-17 DIAGNOSIS — R634 Abnormal weight loss: Secondary | ICD-10-CM | POA: Diagnosis not present

## 2021-11-17 DIAGNOSIS — G893 Neoplasm related pain (acute) (chronic): Secondary | ICD-10-CM

## 2021-11-17 DIAGNOSIS — Z515 Encounter for palliative care: Secondary | ICD-10-CM

## 2021-11-17 DIAGNOSIS — K5903 Drug induced constipation: Secondary | ICD-10-CM | POA: Diagnosis not present

## 2021-11-17 DIAGNOSIS — R630 Anorexia: Secondary | ICD-10-CM

## 2021-11-18 ENCOUNTER — Inpatient Hospital Stay: Payer: Medicare Other

## 2021-11-18 ENCOUNTER — Encounter: Payer: Self-pay | Admitting: Nurse Practitioner

## 2021-11-18 ENCOUNTER — Other Ambulatory Visit: Payer: Self-pay | Admitting: Internal Medicine

## 2021-11-18 DIAGNOSIS — N529 Male erectile dysfunction, unspecified: Secondary | ICD-10-CM

## 2021-11-18 MED ORDER — SILDENAFIL CITRATE 50 MG PO TABS: 50.0000 mg | ORAL_TABLET | Freq: Every day | ORAL | 0 refills | Status: DC | PRN

## 2021-11-18 NOTE — Progress Notes (Signed)
Jeremy Rodriguez Note Telephone: [phone]  Fax: [phone]   Date of encounter: 11/18/21 10:50 AM PATIENT NAME: Jeremy Rodriguez -++[phone] Jeremy Rodriguez [place] [id number]   [phone] (home)  DOB: [birth year] MRN: [id number] PRIMARY CARE PROVIDER:    Lindell Spar, MD,  [address] [place] [address] [phone]  REFERRING PROVIDER:   Lindell Spar, MD [address] [place],  [place] [address] [phone]  RESPONSIBLE PARTY:    Contact Information     Name Relation Home Work [place] Spouse [phone]  [phone]      Due to the COVID-19 crisis, this visit was done via telemedicine from my office and it was initiated and consent by this patient and or family.  I connected with Jeremy and  Jeremy Rodriguez OR PROXY on 11/18/21 by telephone as video not available enabled telemedicine application and verified that I am speaking with the correct person using two identifiers.   I discussed the limitations of evaluation and management by telemedicine. The patient expressed understanding and agreed to proceed. Palliative Care was asked to follow this patient by consultation request of  Lindell Spar, MD to address advance care planning and complex medical decision making. This is the initial visit.                                 ASSESSMENT AND PLAN / RECOMMENDATIONS:  Symptom Management/Plan: 1. Advance Care Planning;  Ongoing discussions; discussed at length about medical goals "I am going to die". "No other treatments". We talked about option of hospice benefit under Medicare program. "I don't want hospice, they killed my friend". Attempted to explain hospice services, philosophy, and poc with focus on comfort, resources provided. Jeremy Rodriguez was interested, Jeremy. Rodriguez continued to endorse "hospice kills people". Discussed option of having in home discussion about hospice  services, Jeremy. and Jeremy Rodriguez in agreement to allowing hospice to come to home for informational session and [facility] RN visit for further discussion and assessment of needs.   2. Goals of Care: Goals include to maximize quality of life and symptom management. Our advance care planning conversation included a discussion about:    The value and importance of advance care planning  Exploration of personal, cultural or spiritual beliefs that might influence medical decisions  Exploration of goals of care in the event of a sudden injury or illness  Identification and preparation of a healthcare agent  Review and updating or creation of an advance directive document.  3. Pain controlled with current regimen; will continue with Oxycodone '20mg'$  q8hrs, monitor on pain scale.   4. Constipation, uncontrolled Discussed bowel patterns, what has been provided, he continues to take Movantik 25 mg daily; senna plus, enemas as needed  5. Anorexia, declined, on megace, continue to monitor weights, encourage Jeremy. Rodriguez to eat, comfort foods, oral hydration, supplements.  06/23/2021 weight 155 lbs 10/28/2021 weight 138 lbs 11/11/2021 weight 130 lbs BMI 21.63  10/07/2021 albumin 2.5; total protein 6.6  10/31/2021 albumin 2.4; total protein 5.9  6. Palliative care encounter; Palliative care encounter; Palliative medicine team will continue to support patient, patient's family, and medical team. Visit consisted of counseling and education dealing with the complex and emotionally intense issues of symptom management and palliative care in the setting of serious and potentially life-threatening illness  Follow up Palliative Care Visit: Palliative care will continue to  follow for complex medical decision making, advance care planning, and clarification of goals. Return 2 weeks or prn.  I spent 36 minutes providing this consultation. More than 50% of the time in this consultation was spent in counseling and care  coordination. PPS: 40% per description  Chief Complaint: Initial palliative Rodriguez for complex medical decision making, address goals, manage ongoing symptoms  HISTORY OF PRESENT ILLNESS:  Jeremy Rodriguez is a 71 y.o. year old male  with multiple medical problems including multiple medical problems including metastatic urothelial cancer, CKD, HTN, GERD. Jeremy and Jeremy Rodriguez and I connected by telephone as video not available for initial telemedicine palliative Rodriguez.  We talked about purpose PC. We talked about past medical history, ros, functional abilities. We talked about cancer and treatments he has received. Jeremy Rodriguez endorses there are no more treatments. We talked extensively about pain, constipation, management and medications. We talked Jeremy Rodriguez resides at home with his wife. It was difficult by phone with Jeremy Rodriguez accent and Jeremy Rodriguez did assist with discussion. We talked about medical goals, hospice benefit under medicare program, role pc. We talked about f/u pc visit and will ask hospice to do an informational session and f/u [facility] RN visit. Therapeutic listening, emotional support provided, questions answered. Will schedule f/u visit following PC RN visit should decide not to have hospice.   Oncology History  Metastatic urothelial carcinoma ([place])  08/13/2020 Initial Diagnosis    Metastatic urothelial carcinoma ([place])    08/13/2020 Cancer Staging    Staging form: Urinary Bladder, AJCC 8th Edition - Clinical stage from 08/13/2020: Stage IVB (cTX, cN0, pM1b) - Signed by Derek Jack, MD on 08/13/2020 Stage prefix: Initial diagnosis WHO/ISUP grade (low/high): High Grade Histologic grading system: 2 grade system    08/20/2020 - 10/22/2020 Chemotherapy             11/19/2020 - 01/28/2021 Chemotherapy    Patient is on Treatment Plan : UROTHELIAL LOCALLY ADVANCED / METASTATIC Enfortumab q28d     02/15/2021 - 07/21/2021 Chemotherapy    Patient is on Treatment Plan : BLADDER  Sacituzumab govitecan-hziy Ivette Loyal) q21d     09/30/2021 -  Chemotherapy    Patient is on Treatment Plan : BLADDER Carboplatin D1 / Gemcitabine D1,8 q21d         CANCER STAGING:  Cancer Staging  Metastatic urothelial carcinoma ([place]) Staging form: Urinary Bladder, AJCC 8th Edition - Clinical stage from 08/13/2020: Stage IVB (cTX, cN0, pM1b) - Signed by Derek Jack, MD on 08/13/2020    History obtained from review of EMR, discussion with primary team, and interview with family, facility staff/caregiver and/or Jeremy. Phifer.  I reviewed available labs, medications, imaging, studies and related documents from the EMR.  Records reviewed and summarized above.   ROS 10 point system reviewed all negative except HPI  Physical Exam: Deferred  CURRENT PROBLEM LIST:  Patient Active Problem List   Diagnosis Date Noted   Dehydration 10/14/2021   AKI (acute kidney injury) ([place]) 10/14/2021   Pancytopenia ([place]) 10/14/2021   Hypoalbuminemia due to protein-calorie malnutrition ([place]) 74/16/3845   Toxic metabolic encephalopathy 36/46/8032   Altered mental status 10/13/2021   Insomnia due to medical condition 03/17/2021   Dizziness 03/17/2021   Iron deficiency anemia 11/12/2020   Metastatic urothelial carcinoma ([place]) 08/13/2020   Annual physical exam 07/15/2020   Cancer associated pain 07/15/2020   Mass of left kidney 07/15/2020   Constipation 06/24/2020   Gastroesophageal reflux disease 06/24/2020   Primary hypertension 06/24/2020  Tobacco abuse 06/24/2020   PAST MEDICAL HISTORY:  Active Ambulatory Problems    Diagnosis Date Noted   Constipation 06/24/2020   Gastroesophageal reflux disease 06/24/2020   Primary hypertension 06/24/2020   Tobacco abuse 06/24/2020   Annual physical exam 07/15/2020   Cancer associated pain 07/15/2020   Mass of left kidney 07/15/2020   Metastatic urothelial carcinoma ([place]) 08/13/2020   Iron deficiency anemia 11/12/2020   Insomnia due to medical  condition 03/17/2021   Dizziness 03/17/2021   Altered mental status 10/13/2021   Dehydration 10/14/2021   AKI (acute kidney injury) ([place]) 10/14/2021   Pancytopenia ([place]) 10/14/2021   Hypoalbuminemia due to protein-calorie malnutrition ([place]) 99/37/1696   Toxic metabolic encephalopathy 78/93/8101   Resolved Ambulatory Problems    Diagnosis Date Noted   No Resolved Ambulatory Problems   Past Medical History:  Diagnosis Date   Chronic kidney disease    GERD (gastroesophageal reflux disease)    Hypertension    Medical history non-contributory    SOCIAL HX:  Social History   Tobacco Use   Smoking status: Some Days   Smokeless tobacco: Current   Tobacco comments:    a few cigarettes a day  Substance Use Topics   Alcohol use: Yes    Comment: occ   FAMILY HX:  Family History  Problem Relation Age of Onset   Colon cancer Neg Hx    Colon polyps Neg Hx    Liver cancer Neg Hx    Pancreatic cancer Neg Hx       ALLERGIES:  Allergies  Allergen Reactions   Trodelvy [Sacituzumab Govitecan-Hziy] Other (See Comments)    See notes from 07/14/21 and 07/21/21         PERTINENT MEDICATIONS:  Outpatient Encounter Medications as of 11/17/2021  Medication Sig   baclofen (LIORESAL) 10 MG tablet Take 1 tablet (10 mg total) by mouth 3 (three) times daily. TAKE 1/2 (ONE-HALF) TABLET BY MOUTH THREE TIMES DAILY Strength: 10 mg   benztropine (COGENTIN) 0.5 MG tablet Take 1 tablet (0.5 mg total) by mouth daily.   dicyclomine (BENTYL) 10 MG capsule Take 1 capsule (10 mg total) by mouth 3 (three) times daily as needed for spasms. (Patient taking differently: Take 10 mg by mouth 3 (three) times daily before meals.)   lactulose (CHRONULAC) 10 GM/15ML solution Take 30 mLs (20 g total) by mouth at bedtime as needed for mild constipation.   lidocaine-prilocaine (EMLA) cream Apply 1 Application topically as needed.   LORazepam (ATIVAN) 0.5 MG tablet Take 1 tablet (0.5 mg total) by mouth 2 (two) times  daily before a meal.   megestrol (MEGACE) 400 MG/10ML suspension Take 10 mLs (400 mg total) by mouth daily.   naloxegol oxalate (MOVANTIK) 25 MG TABS tablet Take 1 tablet (25 mg total) by mouth daily.   nicotine (NICODERM CQ - DOSED IN MG/24 HOURS) 21 mg/24hr patch Place 1 patch (21 mg total) onto the skin daily.   Oxycodone HCl 20 MG TABS Take 1 tablet (20 mg total) by mouth every 8 (eight) hours.   pantoprazole (PROTONIX) 40 MG tablet Take 1 tablet (40 mg total) by mouth daily.   prochlorperazine (COMPAZINE) 10 MG tablet Take 1 tablet (10 mg total) by mouth every 6 (six) hours as needed for nausea or vomiting.   traZODone (DESYREL) 100 MG tablet Take 1 tablet (100 mg total) by mouth at bedtime.   [DISCONTINUED] tadalafil (CIALIS) 10 MG tablet Take 2 tablets (20 mg total) by mouth daily as needed for  erectile dysfunction.   Facility-Administered Encounter Medications as of 11/17/2021  Medication   acetaminophen (TYLENOL) 325 MG tablet   famotidine (PEPCID) 20-0.9 MG/50ML-% IVPB   palonosetron (ALOXI) 0.25 MG/5ML injection   sodium chloride flush (NS) 0.9 % injection 10 mL   Thank you for the opportunity to participate in the care of Jeremy. Schedler.  The palliative care team will continue to follow. Please call our office at [phone] if we can be of additional assistance.   Ami Thornsberry Z Lequisha Cammack, NP ,

## 2021-11-19 ENCOUNTER — Telehealth: Payer: Self-pay

## 2021-11-19 NOTE — Telephone Encounter (Addendum)
216 p.  Message received from [place], NP that patient's wife was trying to reach me.  Return call made to wife's cell number.  No answer and VM is full.  Phone call made home number.  No answer.  Message left requesting a call back.   222 pm.  Incoming call from wife Pamala Hurry requesting to schedule an informational mtg for hospice information.  Advised that I would have Fithian Mimosa-community liaison contact her to schedule.

## 2021-11-22 ENCOUNTER — Telehealth: Payer: Self-pay | Admitting: *Deleted

## 2021-11-22 NOTE — Telephone Encounter (Signed)
Received a telephone call from [employer], RN with [facility] Palliative care to advise that patient has elected to transition to Hospice cervices.  Dr. Delton Coombes made aware.

## 2021-11-23 ENCOUNTER — Telehealth: Payer: Self-pay | Admitting: *Deleted

## 2021-11-23 NOTE — Telephone Encounter (Signed)
Received a call from [place], Bryson Ha to advise that despite his wife's request for hospice via my chart he is refusing hospice services at this time and is going to continue with palliative care.

## 2021-11-24 ENCOUNTER — Encounter: Payer: Self-pay | Admitting: Hematology

## 2021-11-25 ENCOUNTER — Encounter: Payer: Self-pay | Admitting: *Deleted

## 2021-11-26 ENCOUNTER — Telehealth: Payer: Self-pay

## 2021-11-26 NOTE — Telephone Encounter (Signed)
140 pm.  Message received from wife Pamala Hurry requesting a call back.  Visit scheduled  for 9/12 @ 10 am.  Wife is feeling overwhelmed with care and needing more support.  Patient has declined hospice.

## 2021-11-29 ENCOUNTER — Other Ambulatory Visit: Payer: Self-pay | Admitting: *Deleted

## 2021-11-29 ENCOUNTER — Other Ambulatory Visit: Payer: Self-pay | Admitting: Internal Medicine

## 2021-11-29 DIAGNOSIS — N529 Male erectile dysfunction, unspecified: Secondary | ICD-10-CM

## 2021-11-29 MED ORDER — OXYCODONE HCL 20 MG PO TABS
20.0000 mg | ORAL_TABLET | Freq: Three times a day (TID) | ORAL | 0 refills | Status: DC
Start: 2021-11-29 — End: 2021-12-01

## 2021-11-29 MED ORDER — SILDENAFIL CITRATE 100 MG PO TABS: 100.0000 mg | ORAL_TABLET | Freq: Every day | ORAL | 0 refills | Status: AC | PRN

## 2021-12-01 ENCOUNTER — Encounter: Payer: Self-pay | Admitting: Internal Medicine

## 2021-12-01 ENCOUNTER — Telehealth: Payer: Self-pay | Admitting: Family Medicine

## 2021-12-01 ENCOUNTER — Other Ambulatory Visit: Payer: Self-pay | Admitting: *Deleted

## 2021-12-01 ENCOUNTER — Ambulatory Visit (INDEPENDENT_AMBULATORY_CARE_PROVIDER_SITE_OTHER): Payer: Medicare Other | Admitting: Internal Medicine

## 2021-12-01 VITALS — BP 126/66 | HR 82 | Ht 66.0 in | Wt 128.0 lb

## 2021-12-01 DIAGNOSIS — F411 Generalized anxiety disorder: Secondary | ICD-10-CM | POA: Insufficient documentation

## 2021-12-01 DIAGNOSIS — C7910 Secondary malignant neoplasm of unspecified urinary organs: Secondary | ICD-10-CM | POA: Diagnosis not present

## 2021-12-01 DIAGNOSIS — K5903 Drug induced constipation: Secondary | ICD-10-CM | POA: Diagnosis not present

## 2021-12-01 DIAGNOSIS — K219 Gastro-esophageal reflux disease without esophagitis: Secondary | ICD-10-CM | POA: Diagnosis not present

## 2021-12-01 DIAGNOSIS — R140 Abdominal distension (gaseous): Secondary | ICD-10-CM | POA: Diagnosis not present

## 2021-12-01 DIAGNOSIS — R109 Unspecified abdominal pain: Secondary | ICD-10-CM | POA: Diagnosis not present

## 2021-12-01 DIAGNOSIS — E8809 Other disorders of plasma-protein metabolism, not elsewhere classified: Secondary | ICD-10-CM

## 2021-12-01 DIAGNOSIS — E46 Unspecified protein-calorie malnutrition: Secondary | ICD-10-CM | POA: Diagnosis not present

## 2021-12-01 DIAGNOSIS — R110 Nausea: Secondary | ICD-10-CM | POA: Diagnosis not present

## 2021-12-01 DIAGNOSIS — G893 Neoplasm related pain (acute) (chronic): Secondary | ICD-10-CM | POA: Diagnosis not present

## 2021-12-01 DIAGNOSIS — R251 Tremor, unspecified: Secondary | ICD-10-CM

## 2021-12-01 DIAGNOSIS — G894 Chronic pain syndrome: Secondary | ICD-10-CM

## 2021-12-01 MED ORDER — TRAZODONE HCL 100 MG PO TABS: 100.0000 mg | ORAL_TABLET | Freq: Every day | ORAL | 2 refills | Status: AC

## 2021-12-01 MED ORDER — OXYCODONE HCL 20 MG PO TABS
20.0000 mg | ORAL_TABLET | Freq: Three times a day (TID) | ORAL | 0 refills | Status: DC
Start: 2021-12-01 — End: 2021-12-01

## 2021-12-01 MED ORDER — DICYCLOMINE HCL 10 MG PO CAPS: 10.0000 mg | ORAL_CAPSULE | Freq: Three times a day (TID) | ORAL | 6 refills | Status: AC | PRN

## 2021-12-01 MED ORDER — ESCITALOPRAM OXALATE 10 MG PO TABS: 10.0000 mg | ORAL_TABLET | Freq: Every day | ORAL | 1 refills | Status: AC

## 2021-12-01 MED ORDER — PANTOPRAZOLE SODIUM 40 MG PO TBEC: 40.0000 mg | DELAYED_RELEASE_TABLET | Freq: Every day | ORAL | 3 refills | Status: AC

## 2021-12-01 MED ORDER — BENZTROPINE MESYLATE 0.5 MG PO TABS: 0.5000 mg | ORAL_TABLET | Freq: Two times a day (BID) | ORAL | 2 refills | Status: AC

## 2021-12-01 MED ORDER — OXYCODONE HCL 20 MG PO TABS: 20.0000 mg | ORAL_TABLET | Freq: Four times a day (QID) | ORAL | 0 refills | Status: AC | PRN

## 2021-12-01 MED ORDER — LORAZEPAM 0.5 MG PO TABS: 0.5000 mg | ORAL_TABLET | Freq: Two times a day (BID) | ORAL | 2 refills | Status: AC

## 2021-12-01 MED ORDER — TRAMADOL HCL 50 MG PO TABS: 50.0000 mg | ORAL_TABLET | Freq: Two times a day (BID) | ORAL | 3 refills | Status: AC | PRN

## 2021-12-01 MED ORDER — PROCHLORPERAZINE MALEATE 10 MG PO TABS: 10.0000 mg | ORAL_TABLET | Freq: Four times a day (QID) | ORAL | 3 refills | Status: AC | PRN

## 2021-12-01 NOTE — Assessment & Plan Note (Signed)
Opioid induced Continue Colace and lactulose On Movantik for now Maintain adequate hydration by taking at least 64 ounces of fluid in a day.

## 2021-12-01 NOTE — Patient Instructions (Signed)
Please continue taking medications as prescribed.  Please take Oxycodone for moderate to severe pain as prescribed. Please take Tramadol as needed for breakthrough pain.

## 2021-12-01 NOTE — Assessment & Plan Note (Signed)
Used to see Oncology Did not tolerate chemotherapy Does not want any treatment for now Denied hospice care as well

## 2021-12-01 NOTE — Assessment & Plan Note (Signed)
Has chronic anxiety, worse with cancer-related pain On Ativan 0.5 mg BID PRN On Trazodone 100 mg qHS

## 2021-12-01 NOTE — Assessment & Plan Note (Signed)
Uncontrolled with benztropine 0.5 mg QD Increased dose to 0.5 mg twice daily for now

## 2021-12-01 NOTE — Telephone Encounter (Signed)
Pharmacist called stating that since there has been no change in dosing of oxycodone insurance will not cover the med , and that a change in dose will need to be made as fill is 1 week early Also stated that they think that the tramadol will have little effect on his pain I advised fill for payment with cash 1 day/ 24 hours and contact PCP in am Dr Posey Pronto is aware as I messaged him, as he is off tomorrow , I offered to call pharmacy to fill for 2 days,Dr Posey Pronto states that he will look at this later this pm

## 2021-12-01 NOTE — Assessment & Plan Note (Signed)
Has chronic, uncontrolled pain On oxycodone 20 mg TID PRN, but still has pain Increased dose to QID for now Tramadol PRN for breakthrough pain Would prefer palliative care to handle his chronic pain

## 2021-12-01 NOTE — Assessment & Plan Note (Signed)
Continue Pantoprazole.

## 2021-12-01 NOTE — Assessment & Plan Note (Signed)
On Megace to improve appetite Needs to take protein supplement

## 2021-12-01 NOTE — Progress Notes (Signed)
Established Patient Office Visit  Subjective:  Patient ID: Jeremy Rodriguez, male    DOB: [birth year]  Age: 71 y.o. MRN: [id number]  CC:  Chief Complaint  Patient presents with   Medication Refill    HPI Jeremy Rodriguez is a 71 y.o. male with past medical history of metastatic urothelial carcinoma, chronic pain, GAD and chronic constipation who presents for f/u of his chronic medical conditions.  Metastatic urothelial carcinoma: He has tried different chemotherapy regimens, but did not tolerate them.  He has opted to avoid any oncologic treatment for now.  He has chronic, severe abdominal pain, which is in flank area, constant, nonradiating and is worse with movement.  He has chronic constipation from opioid medication.  He is currently taking oxycodone 3-4 times in a day.  He has currently run out of his oxycodone and requests refill of it.  He was prescribed OXYCODONE 20 mg 3 times daily, but has required it more than 3 times in a day due to severe abdominal pain.  He was referred to palliative care, but has denied hospice care.  I had lengthy discussion explaining the role of palliative care/hospice, but he does not agree to it as he believes that it is to "make people die".  Chronic constipation: He takes Movantik for chronic opioid-induced constipation.  He denies any melena or hematochezia.  He also takes pantoprazole for GERD.  He takes Compazine as needed for nausea.  GAD: He takes Ativan twice daily as needed for anxiety.  He also takes trazodone 100 mg nightly as needed for insomnia.  Protein calorie malnutrition: He has anorexia, for which he takes Megace.  He has lost 26 lbs since the last visit with [place] in 04/22.       Past Medical History:  Diagnosis Date   Chronic kidney disease    GERD (gastroesophageal reflux disease)    Hypertension    Medical history non-contributory     Past Surgical History:  Procedure Laterality Date   appendectomy     PORTACATH  PLACEMENT Right 05/26/2021   Procedure: INSERTION PORT-A-CATH- IJ;  Surgeon: Rusty Aus, DO;  Location: [facility];  Service: General;  Laterality: Right;  Internal Jugular     Family History  Problem Relation Age of Onset   Colon cancer Neg Hx    Colon polyps Neg Hx    Liver cancer Neg Hx    Pancreatic cancer Neg Hx     Social History   Socioeconomic History   Marital status: Married    Spouse name: Not on file   Number of children: Not on file   Years of education: Not on file   Highest education level: Not on file  Occupational History   Not on file  Tobacco Use   Smoking status: Some Days   Smokeless tobacco: Current   Tobacco comments:    a few cigarettes a day  Vaping Use   Vaping Use: Never used  Substance and Sexual Activity   Alcohol use: Yes    Comment: occ   Drug use: Yes   Sexual activity: Not on file  Other Topics Concern   Not on file  Social History Narrative   Not on file   Social Determinants of Health   Financial Resource Strain: Low Risk  (11/09/2020)   Overall Financial Resource Strain (CARDIA)    Difficulty of Paying Living Expenses: Not hard at all  Food Insecurity: No Food Insecurity (11/15/2021)   Hunger Vital Sign  Worried About Charity fundraiser in the Last Year: Never true    [place] in the Last Year: Never true  Transportation Needs: No Transportation Needs (11/15/2021)   PRAPARE - Hydrologist (Medical): No    Lack of Transportation (Non-Medical): No  Physical Activity: Inactive (11/15/2021)   Exercise Vital Sign    Days of Exercise per Week: 0 days    Minutes of Exercise per Session: 0 min  Stress: No Stress Concern Present (11/15/2021)   Kevin    Feeling of Stress : Not at all  Social Connections: Moderately Isolated (11/15/2021)   Social Connection and Isolation Panel [NHANES]    Frequency of Communication with  Friends and Family: More than three times a week    Frequency of Social Gatherings with Friends and Family: Once a week    Attends Religious Services: Never    Marine scientist or Organizations: No    Attends Archivist Meetings: Never    Marital Status: Married  Human resources officer Violence: Not At Risk (11/15/2021)   Humiliation, Afraid, Rape, and Kick questionnaire    Fear of Current or Ex-Partner: No    Emotionally Abused: No    Physically Abused: No    Sexually Abused: No    Outpatient Medications Prior to Visit  Medication Sig Dispense Refill   baclofen (LIORESAL) 10 MG tablet Take 1 tablet (10 mg total) by mouth 3 (three) times daily. TAKE 1/2 (ONE-HALF) TABLET BY MOUTH THREE TIMES DAILY Strength: 10 mg 60 tablet 0   lactulose (CHRONULAC) 10 GM/15ML solution Take 30 mLs (20 g total) by mouth at bedtime as needed for mild constipation. 473 mL 0   lidocaine-prilocaine (EMLA) cream Apply 1 Application topically as needed. 30 g 0   megestrol (MEGACE) 400 MG/10ML suspension Take 10 mLs (400 mg total) by mouth daily. 480 mL 3   naloxegol oxalate (MOVANTIK) 25 MG TABS tablet Take 1 tablet (25 mg total) by mouth daily. 30 tablet 6   Sennosides (SENOKOT EXTRA STRENGTH) 17.2 MG TABS Take 2 tablets by mouth in the morning and at bedtime.     sildenafil (VIAGRA) 100 MG tablet Take 1 tablet (100 mg total) by mouth daily as needed for erectile dysfunction. 30 tablet 0   benztropine (COGENTIN) 0.5 MG tablet Take 1 tablet (0.5 mg total) by mouth daily. 30 tablet 2   dicyclomine (BENTYL) 10 MG capsule Take 1 capsule (10 mg total) by mouth 3 (three) times daily as needed for spasms. (Patient taking differently: Take 10 mg by mouth 3 (three) times daily before meals.) 90 capsule 6   escitalopram (LEXAPRO) 10 MG tablet Take 10 mg by mouth daily.     LORazepam (ATIVAN) 0.5 MG tablet Take 1 tablet (0.5 mg total) by mouth 2 (two) times daily before a meal. 60 tablet 0   Oxycodone HCl 20 MG  TABS Take 1 tablet (20 mg total) by mouth every 8 (eight) hours. 90 tablet 0   pantoprazole (PROTONIX) 40 MG tablet Take 1 tablet (40 mg total) by mouth daily. 30 tablet 6   prochlorperazine (COMPAZINE) 10 MG tablet Take 1 tablet (10 mg total) by mouth every 6 (six) hours as needed for nausea or vomiting. 30 tablet 3   traZODone (DESYREL) 100 MG tablet Take 1 tablet (100 mg total) by mouth at bedtime. 30 tablet 2   nicotine (NICODERM CQ - DOSED IN  MG/24 HOURS) 21 mg/24hr patch Place 1 patch (21 mg total) onto the skin daily. (Patient not taking: Reported on 12/01/2021) 28 patch 0   Facility-Administered Medications Prior to Visit  Medication Dose Route Frequency Provider Last Rate Last Admin   acetaminophen (TYLENOL) 325 MG tablet            famotidine (PEPCID) 20-0.9 MG/50ML-% IVPB            palonosetron (ALOXI) 0.25 MG/5ML injection            sodium chloride flush (NS) 0.9 % injection 10 mL  10 mL Intravenous PRN Derek Jack, MD   10 mL at 08/04/21 1030    Allergies  Allergen Reactions   Ivette Loyal [Sacituzumab Govitecan-Hziy] Other (See Comments)    See notes from 07/14/21 and 07/21/21        ROS Review of Systems  Constitutional:  Negative for chills and fever.  HENT:  Negative for congestion and sore throat.   Eyes:  Negative for pain and discharge.  Respiratory:  Negative for cough and shortness of breath.   Cardiovascular:  Negative for chest pain and palpitations.  Gastrointestinal:  Positive for abdominal distention and abdominal pain (LLQ). Negative for constipation, diarrhea, nausea and vomiting.  Endocrine: Negative for polydipsia and polyuria.  Genitourinary:  Negative for dysuria and hematuria.  Musculoskeletal:  Negative for neck pain and neck stiffness.  Skin:  Negative for rash.  Neurological:  Negative for dizziness, weakness, numbness and headaches.  Psychiatric/Behavioral:  Negative for agitation and behavioral problems.       Objective:    Physical  Exam Vitals reviewed.  Constitutional:      General: He is not in acute distress.    Appearance: He is not diaphoretic.  HENT:     Head: Normocephalic and atraumatic.     Nose: Nose normal.     Mouth/Throat:     Mouth: Mucous membranes are moist.  Eyes:     General: No scleral icterus.    Extraocular Movements: Extraocular movements intact.  Cardiovascular:     Rate and Rhythm: Normal rate and regular rhythm.     Pulses: Normal pulses.     Heart sounds: Normal heart sounds. No murmur heard. Pulmonary:     Breath sounds: Normal breath sounds. No wheezing or rales.  Abdominal:     Palpations: Abdomen is soft.     Tenderness: There is abdominal tenderness (LLQ).  Musculoskeletal:     Cervical back: Neck supple. No tenderness.     Right lower leg: No edema.     Left lower leg: No edema.  Skin:    General: Skin is warm.     Findings: No rash.  Neurological:     General: No focal deficit present.     Mental Status: He is alert and oriented to person, place, and time.     Sensory: No sensory deficit.     Motor: No weakness.  Psychiatric:        Mood and Affect: Mood is anxious.        Behavior: Behavior normal.     BP 126/66   Pulse 82   Ht _0  (1.676 m)   Wt 128 lb (58.1 kg)   SpO2 99%   BMI 20.66 kg/m  Wt Readings from Last 3 Encounters:  12/01/21 128 lb (58.1 kg)  11/11/21 130 lb (59 kg)  10/31/21 145 lb (65.8 kg)    Lab Results  Component Value Date   TSH  1.628 06/03/2021   Lab Results  Component Value Date   WBC 6.5 10/31/2021   HGB 8.7 (L) 10/31/2021   HCT 25.9 (L) 10/31/2021   MCV 88.7 10/31/2021   PLT 276 10/31/2021   Lab Results  Component Value Date   NA 132 (L) 10/31/2021   K 3.9 10/31/2021   CO2 23 10/31/2021   GLUCOSE 93 10/31/2021   BUN 15 10/31/2021   CREATININE 0.78 10/31/2021   BILITOT 0.7 10/31/2021   ALKPHOS 96 10/31/2021   AST 30 10/31/2021   ALT 24 10/31/2021   PROT 5.9 (L) 10/31/2021   ALBUMIN 2.4 (L) 10/31/2021   CALCIUM  7.7 (L) 10/31/2021   ANIONGAP 4 (L) 10/31/2021   EGFR 70 07/15/2020   Lab Results  Component Value Date   CHOL 180 07/15/2020   Lab Results  Component Value Date   HDL 42 07/15/2020   Lab Results  Component Value Date   LDLCALC 103 (H) 07/15/2020   Lab Results  Component Value Date   TRIG 205 (H) 07/15/2020   Lab Results  Component Value Date   CHOLHDL 4.3 07/15/2020   Lab Results  Component Value Date   HGBA1C 5.9 (H) 07/15/2020      Assessment & Plan:   Problem List Items Addressed This Visit       Digestive   Gastroesophageal reflux disease    Continue Pantoprazole      Relevant Medications   Sennosides (SENOKOT EXTRA STRENGTH) 17.2 MG TABS   pantoprazole (PROTONIX) 40 MG tablet   dicyclomine (BENTYL) 10 MG capsule     Genitourinary   Metastatic urothelial carcinoma (HCC) - Primary    Used to see Oncology Did not tolerate chemotherapy Does not want any treatment for now Denied hospice care as well      Relevant Medications   LORazepam (ATIVAN) 0.5 MG tablet   prochlorperazine (COMPAZINE) 10 MG tablet   Oxycodone HCl 20 MG TABS     Other   Constipation    Opioid induced Continue Colace and lactulose On Movantik for now Maintain adequate hydration by taking at least 64 ounces of fluid in a day.      Cancer associated pain    Has chronic, uncontrolled pain On oxycodone 20 mg TID PRN, but still has pain Increased dose to QID for now Tramadol PRN for breakthrough pain Would prefer palliative care to handle his chronic pain      Relevant Medications   traMADol (ULTRAM) 50 MG tablet   Oxycodone HCl 20 MG TABS   Hypoalbuminemia due to protein-calorie malnutrition (HCC)    On Megace to improve appetite Needs to take protein supplement      Tremors of nervous system    Uncontrolled with benztropine 0.5 mg QD Increased dose to 0.5 mg twice daily for now      Relevant Medications   benztropine (COGENTIN) 0.5 MG tablet   GAD (generalized  anxiety disorder)    Has chronic anxiety, worse with cancer-related pain On Ativan 0.5 mg BID PRN On Trazodone 100 mg qHS      Relevant Medications   LORazepam (ATIVAN) 0.5 MG tablet   traZODone (DESYREL) 100 MG tablet   escitalopram (LEXAPRO) 10 MG tablet   Other Visit Diagnoses     Abdominal bloating with cramps       Relevant Medications   dicyclomine (BENTYL) 10 MG capsule   Nausea       Relevant Medications   prochlorperazine (COMPAZINE) 10 MG tablet  Chronic pain syndrome       Relevant Medications   traZODone (DESYREL) 100 MG tablet   escitalopram (LEXAPRO) 10 MG tablet   traMADol (ULTRAM) 50 MG tablet   Oxycodone HCl 20 MG TABS       Meds ordered this encounter  Medications   LORazepam (ATIVAN) 0.5 MG tablet    Sig: Take 1 tablet (0.5 mg total) by mouth 2 (two) times daily before a meal.    Dispense:  60 tablet    Refill:  2   pantoprazole (PROTONIX) 40 MG tablet    Sig: Take 1 tablet (40 mg total) by mouth daily.    Dispense:  90 tablet    Refill:  3   traZODone (DESYREL) 100 MG tablet    Sig: Take 1 tablet (100 mg total) by mouth at bedtime.    Dispense:  30 tablet    Refill:  2   escitalopram (LEXAPRO) 10 MG tablet    Sig: Take 1 tablet (10 mg total) by mouth daily.    Dispense:  90 tablet    Refill:  1   dicyclomine (BENTYL) 10 MG capsule    Sig: Take 1 capsule (10 mg total) by mouth 3 (three) times daily as needed for spasms.    Dispense:  90 capsule    Refill:  6   benztropine (COGENTIN) 0.5 MG tablet    Sig: Take 1 tablet (0.5 mg total) by mouth 2 (two) times daily.    Dispense:  60 tablet    Refill:  2   prochlorperazine (COMPAZINE) 10 MG tablet    Sig: Take 1 tablet (10 mg total) by mouth every 6 (six) hours as needed for nausea or vomiting.    Dispense:  30 tablet    Refill:  3   DISCONTD: Oxycodone HCl 20 MG TABS    Sig: Take 1 tablet (20 mg total) by mouth every 8 (eight) hours.    Dispense:  90 tablet    Refill:  0    Okay to fill  early - 12/01/21   traMADol (ULTRAM) 50 MG tablet    Sig: Take 1 tablet (50 mg total) by mouth every 12 (twelve) hours as needed for severe pain (For breakthrough pain).    Dispense:  30 tablet    Refill:  3   Oxycodone HCl 20 MG TABS    Sig: Take 1 tablet (20 mg total) by mouth every 6 (six) hours as needed.    Dispense:  60 tablet    Refill:  0    Okay to fill early - 12/01/21    Follow-up: Return in about 2 months (around 01/31/2022).    Lindell Spar, MD

## 2021-12-02 ENCOUNTER — Inpatient Hospital Stay: Payer: Medicare Other | Admitting: Hematology

## 2021-12-02 ENCOUNTER — Inpatient Hospital Stay: Payer: Medicare Other

## 2021-12-02 ENCOUNTER — Encounter: Payer: Self-pay | Admitting: Internal Medicine

## 2021-12-02 ENCOUNTER — Telehealth: Payer: Self-pay | Admitting: Internal Medicine

## 2021-12-02 NOTE — Telephone Encounter (Signed)
[facility] Called in on patient behalf, spoke with dR simpson last night as se was on call for Dr. Posey Pronto  in regard to patient pain meds. Pharm has issued 2 days worth and has not received a call back to insure next steps for patient to receive meds before insurance coverage

## 2021-12-02 NOTE — Telephone Encounter (Signed)
Med was approved

## 2021-12-07 ENCOUNTER — Telehealth: Payer: Self-pay

## 2021-12-07 NOTE — Telephone Encounter (Signed)
415 pm.  Phone call made to patient to follow up on overall status and wishes for hospice vs PC support.  No answer.  Message left requesting a call back.

## 2021-12-09 ENCOUNTER — Inpatient Hospital Stay: Payer: Medicare Other

## 2021-12-14 ENCOUNTER — Other Ambulatory Visit: Payer: Medicare Other

## 2021-12-22 DIAGNOSIS — F32A Depression, unspecified: Secondary | ICD-10-CM

## 2021-12-22 DIAGNOSIS — F17210 Nicotine dependence, cigarettes, uncomplicated: Secondary | ICD-10-CM | POA: Diagnosis not present

## 2021-12-22 DIAGNOSIS — D61818 Other pancytopenia: Secondary | ICD-10-CM | POA: Diagnosis not present

## 2021-12-22 DIAGNOSIS — E46 Unspecified protein-calorie malnutrition: Secondary | ICD-10-CM | POA: Diagnosis not present

## 2021-12-22 DIAGNOSIS — K219 Gastro-esophageal reflux disease without esophagitis: Secondary | ICD-10-CM

## 2021-12-22 DIAGNOSIS — G250 Essential tremor: Secondary | ICD-10-CM

## 2021-12-22 DIAGNOSIS — R109 Unspecified abdominal pain: Secondary | ICD-10-CM

## 2021-12-22 DIAGNOSIS — C652 Malignant neoplasm of left renal pelvis: Secondary | ICD-10-CM | POA: Diagnosis not present

## 2021-12-22 DIAGNOSIS — I10 Essential (primary) hypertension: Secondary | ICD-10-CM

## 2022-01-13 DIAGNOSIS — R52 Pain, unspecified: Secondary | ICD-10-CM | POA: Diagnosis not present

## 2022-01-13 DIAGNOSIS — R5381 Other malaise: Secondary | ICD-10-CM | POA: Diagnosis not present

## 2022-01-31 ENCOUNTER — Ambulatory Visit: Payer: Medicare Other | Admitting: Internal Medicine

## 2022-02-02 DEATH — deceased

## 2022-10-30 IMAGING — CT CT BIOPSY
2 of 3 series · 13 of 32 positions shown, 18 images · non-contrast
Comparison: PET-CT-08/04/2020;

INDICATION: Concern for metastatic renal cell carcinoma, now with indeterminate
liver lesions worrisome for metastatic disease. Please perform image
guided hepatic lesion biopsy for tissue diagnostic purposes.

EXAM:
ULTRASOUND GUIDED LIVER LESION BIOPSY

[Series 2: i-spiral 5.0 b40f · axial · 0.85mm/px · z∈[+1071,+1243]mm · 8 of 64 slices shown, 13 images (1 of 2)]
[im 8/64  soft-tissue]
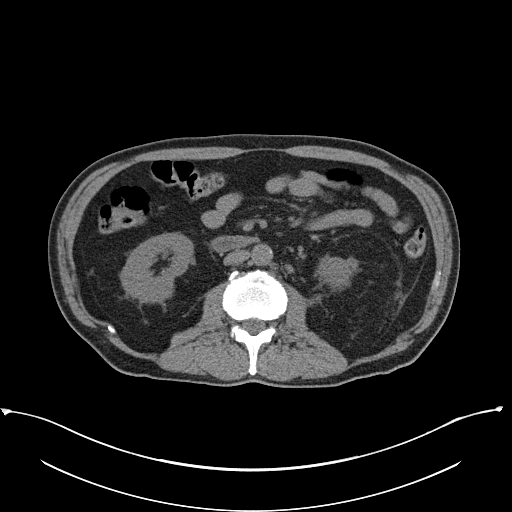
[im 8/64  bone]
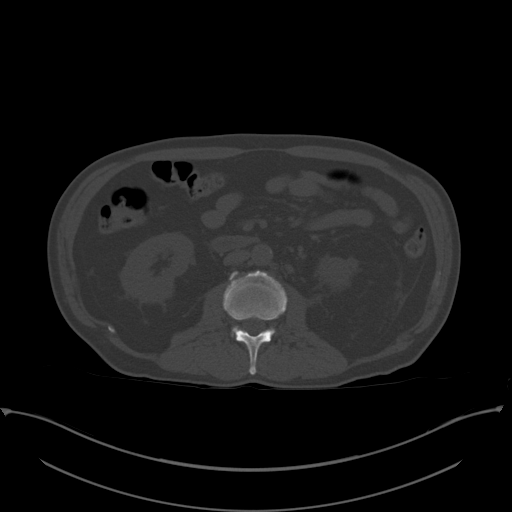
[im 15/64  soft-tissue]
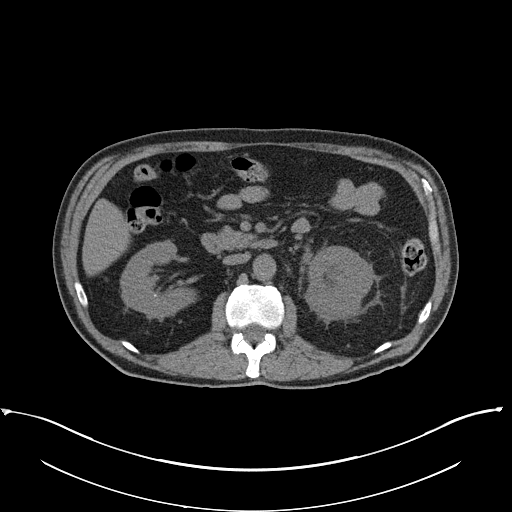
[im 22/64  soft-tissue]
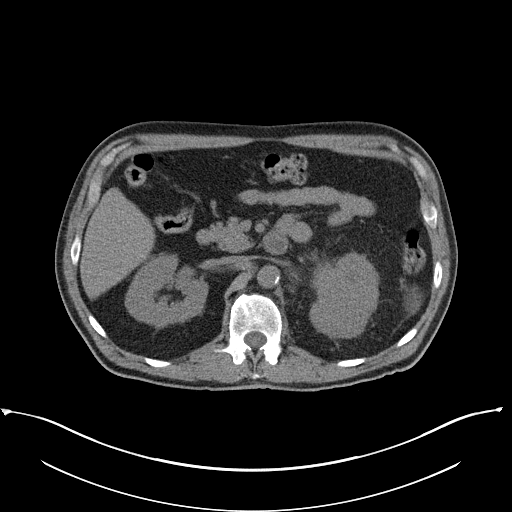
[im 29/64  soft-tissue]
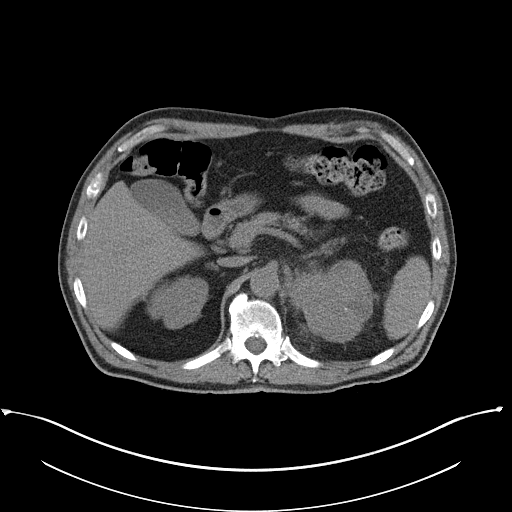
[im 36/64  soft-tissue]
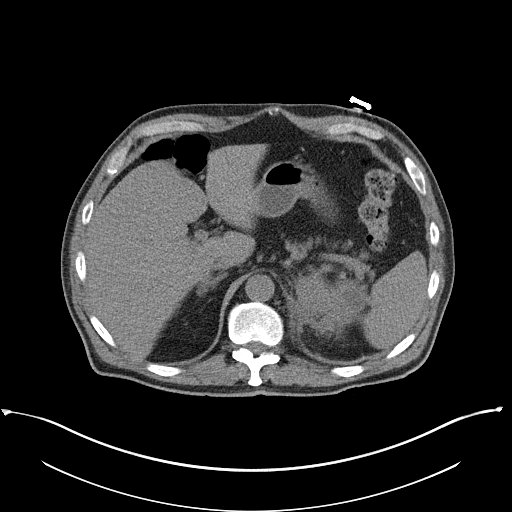
[im 36/64  lung]
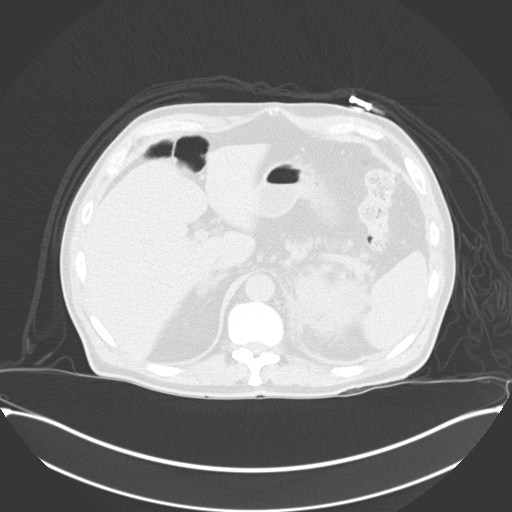
[im 43/64  soft-tissue]
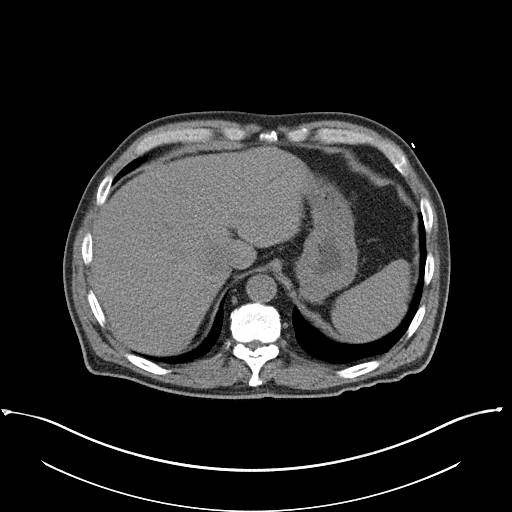
[im 43/64  lung]
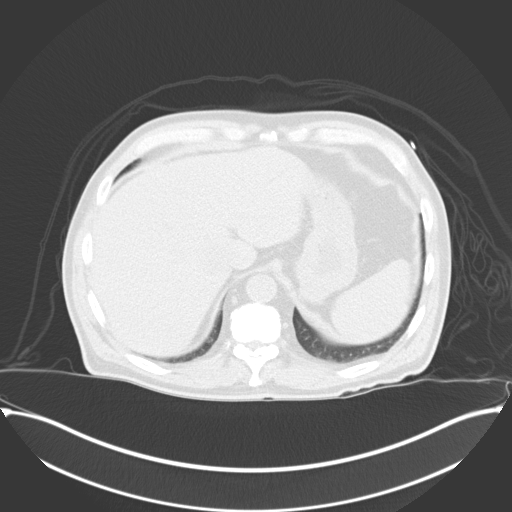
[im 50/64  soft-tissue]
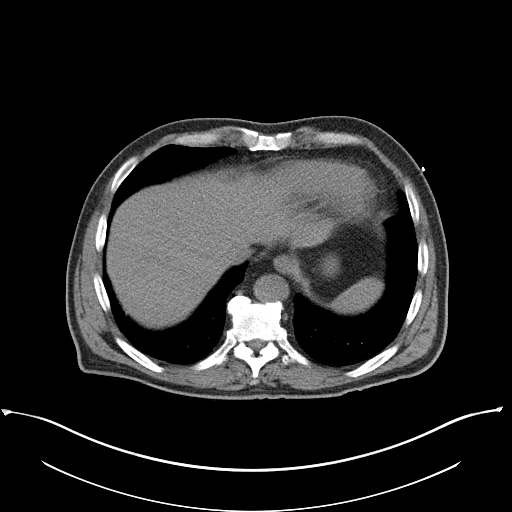
[im 50/64  lung]
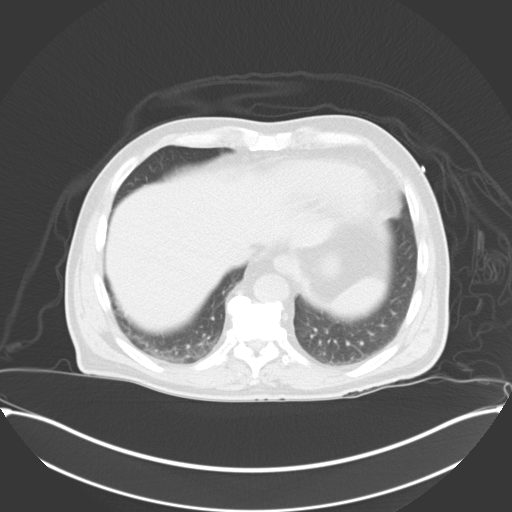
[im 57/64  soft-tissue]
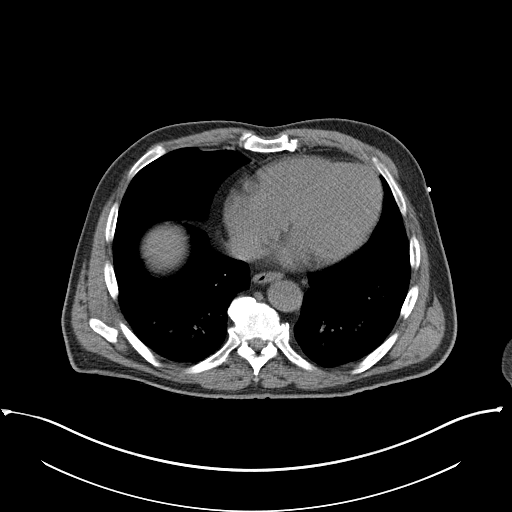
[im 57/64  lung]
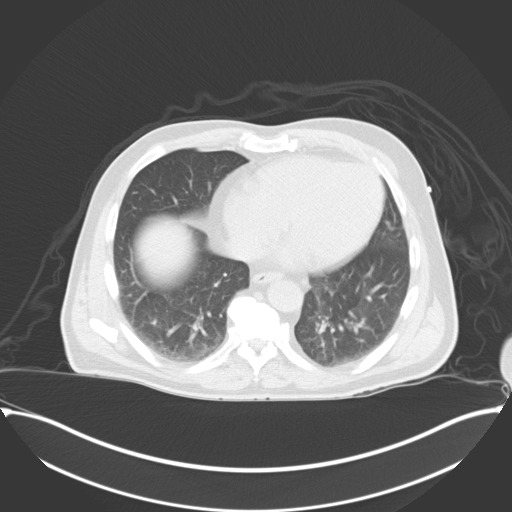

[Series 3: i-spiral 5.0 b40f · axial · 0.85mm/px · z∈[+1071,+1180]mm · 5 of 63 slices shown (2 of 2)]
[im 7/63  soft-tissue]
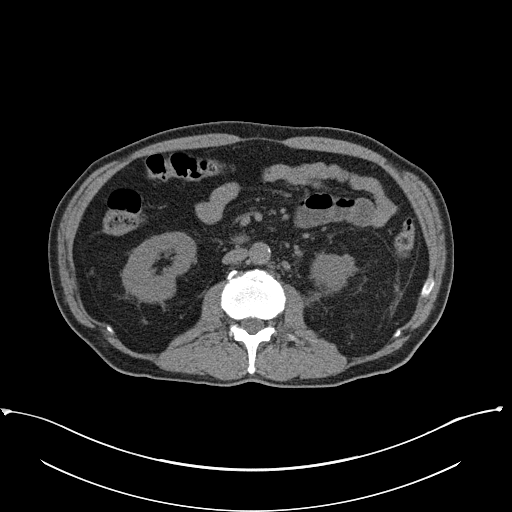
[im 13/63  soft-tissue]
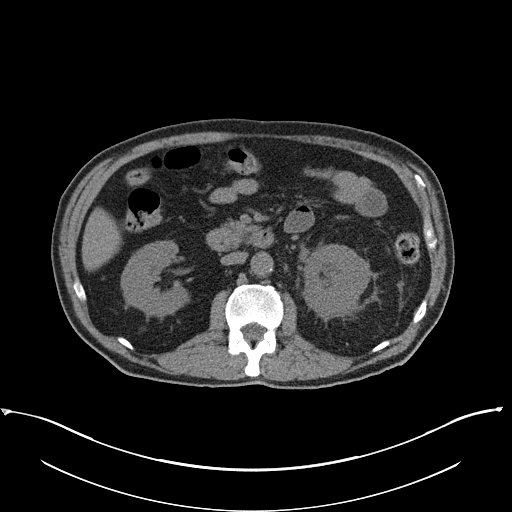
[im 19/63  soft-tissue]
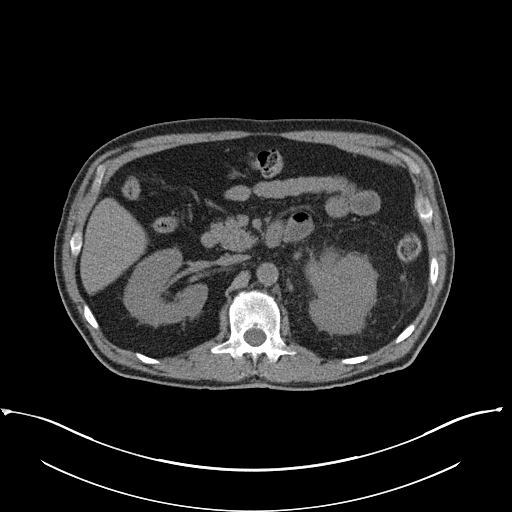
[im 25/63  soft-tissue]
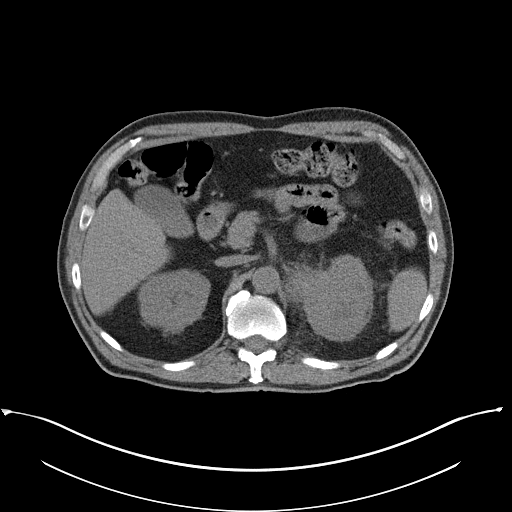
[im 38/63  soft-tissue]
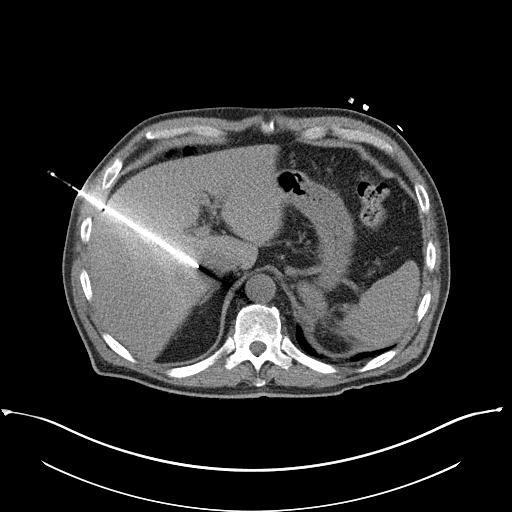

[13 of 32 positions shown; findings below may reference images not displayed]

chest CT-07/20/2020; abdominal
MRI-07/20/2020; CT abdomen and pelvis-07/10/2020

MEDICATIONS:
None

ANESTHESIA/SEDATION:
Fentanyl 50 mcg IV; Versed 1 mg IV

Total Moderate Sedation time:  20 Minutes.

The patient's level of consciousness and vital signs were monitored
continuously by radiology nursing throughout the procedure under my
direct supervision.

COMPLICATIONS:
None immediate.

PROCEDURE:
Informed written consent was obtained from the patient after a
discussion of the risks, benefits and alternatives to treatment. The
patient understands and consents the procedure. A timeout was
performed prior to the initiation of the procedure.

Patient was positioned supine on the CT gantry demonstrating
unchanged appearance of the ill-defined hypoattenuating lesion
within the caudate lobe measuring approximately 2.5 x 2.5 cm (image
23, series 2 as well as the ill-defined punctate (approximately
cm) hypoattenuating lesion within the dome of the right lobe of the
liver (image 16, series 2). Both of these lesions were identified
sonographically and the procedure was planned

The right upper abdominal quadrant was prepped and draped in the
usual sterile fashion.

While the additional plan was to proceed with ultrasound-guided
biopsy of the smaller liver lesion the lesion became very difficult
to evaluate once the patient was sedated given interposition of the
pulmonary parenchyma. As such the decision was made to proceed with
ultrasound-guided biopsy of the larger lesion within the caudate.

After lying soft tissues were anesthetized with 1% lidocaine with
epinephrine, a 17 gauge co-axial needle was advanced into a
peripheral aspect of the lesion. Appropriate needle position was
confirmed with CT imaging.

Next, 2 core biopsies with an 18 gauge core device under direct
ultrasound guidance.

The coaxial needle tract was embolized with a small amount of
Gel-Foam slurry and superficial hemostasis was obtained with manual
compression. Postprocedural CT imaging was negative for definitive
area of hemorrhage or additional complication. A dressing was
applied. The patient tolerated the procedure well without immediate
post procedural complication.
IMPRESSION: Technically successful ultrasound and CT guided core needle biopsy
of dominant hypermetabolic lesion within the caudate.

## 2023-04-30 IMAGING — CT CT CHEST-ABD-PELV W/ CM
3 of 5 series · 14 of 36 positions shown, 15 images · IV contrast (Omnipaque or Isovue)
Comparison: 11/11/2020 CT chest, abdomen and pelvis.

CLINICAL DATA: Metastatic urothelial cancer diagnosed August 2020,
status post chemotherapy. Restaging.

EXAM:
CT CHEST, ABDOMEN, AND PELVIS WITH CONTRAST
TECHNIQUE: Multidetector CT imaging of the chest, abdomen and pelvis was
performed following the standard protocol during bolus
administration of intravenous contrast.
CONTRAST:  100mL OMNIPAQUE IOHEXOL 300 MG/ML  SOLN

[Series 2: cap with · axial · 0.75mm/px · z∈[+1031,+1556]mm · 8 of 137 slices shown, 9 images]
[im 16/137  mediastinal]
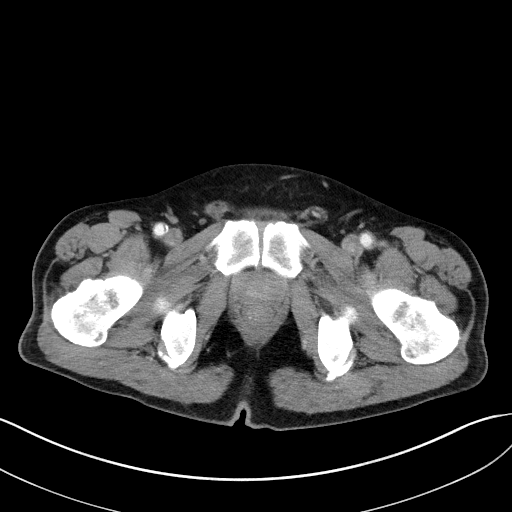
[im 16/137  bone]
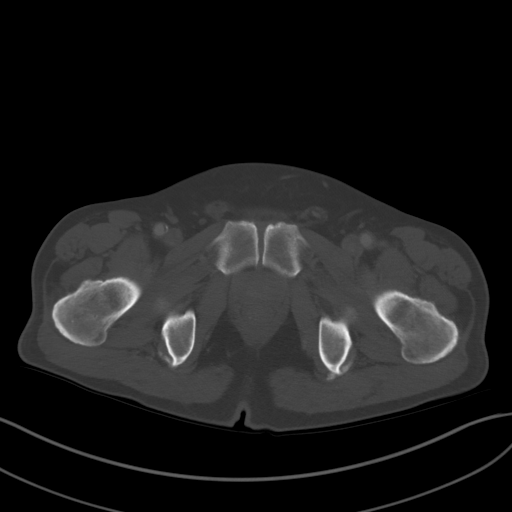
[im 31/137  mediastinal]
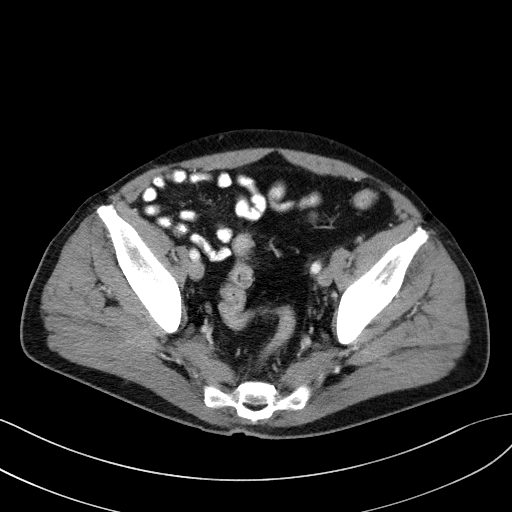
[im 46/137  mediastinal]
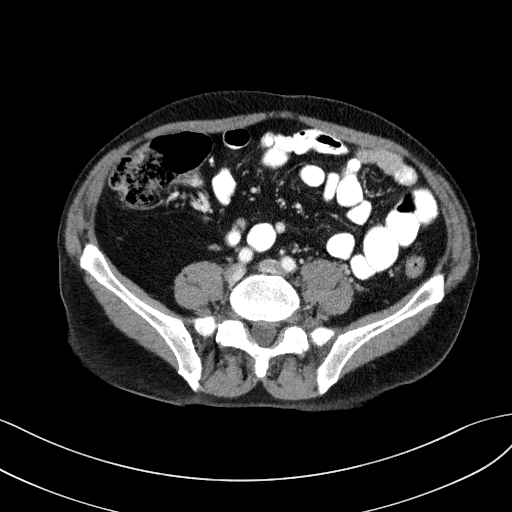
[im 61/137  mediastinal]
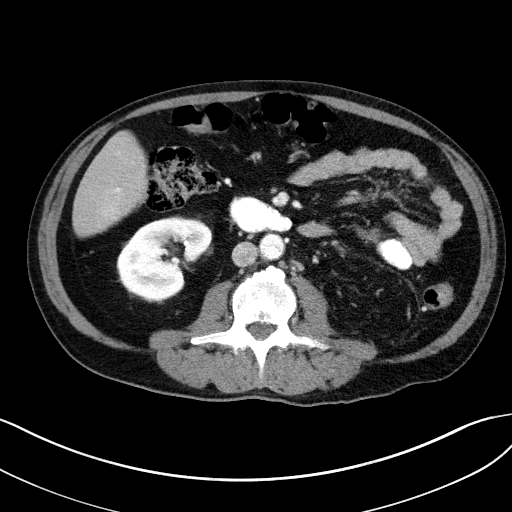
[im 76/137  mediastinal]
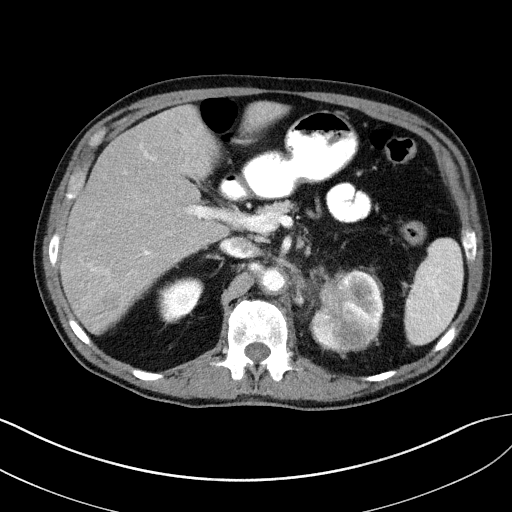
[im 91/137  mediastinal]
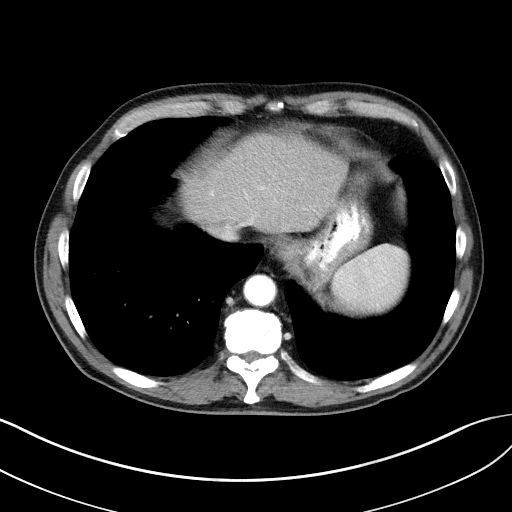
[im 106/137  mediastinal]
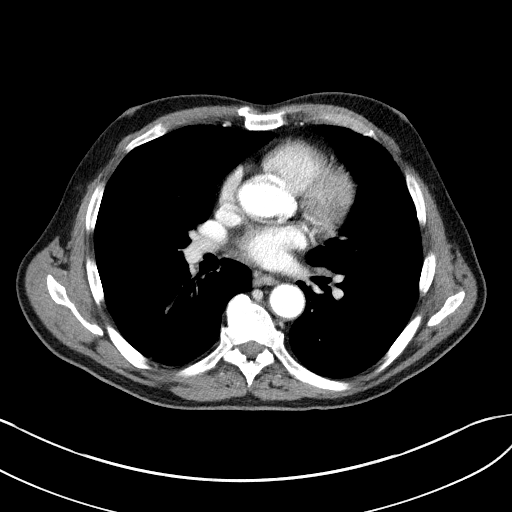
[im 121/137  mediastinal]
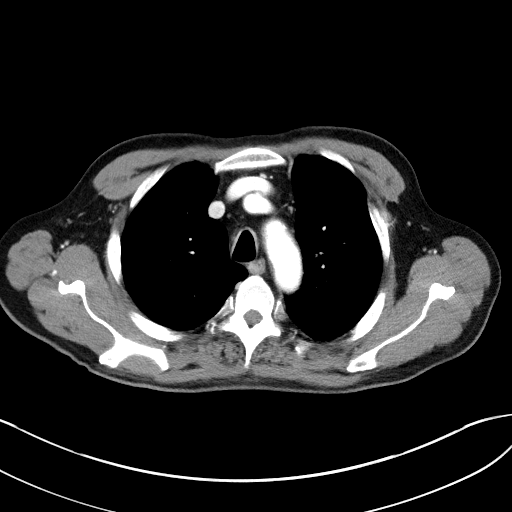

[Series 4: lung · axial · 0.75mm/px · z∈[+1296,+1380]mm · 3 of 182 slices shown]
[im 14/182  bone]
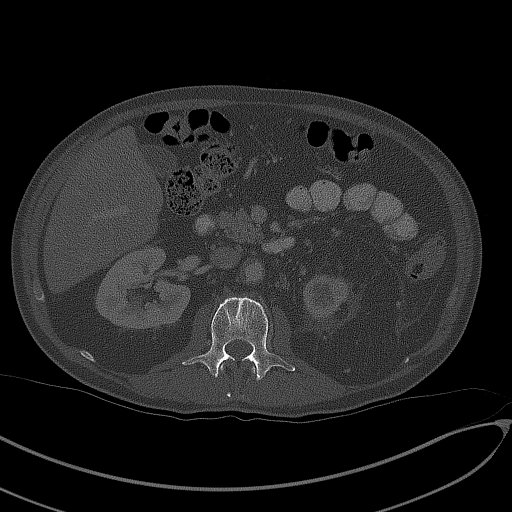
[im 42/182  bone]
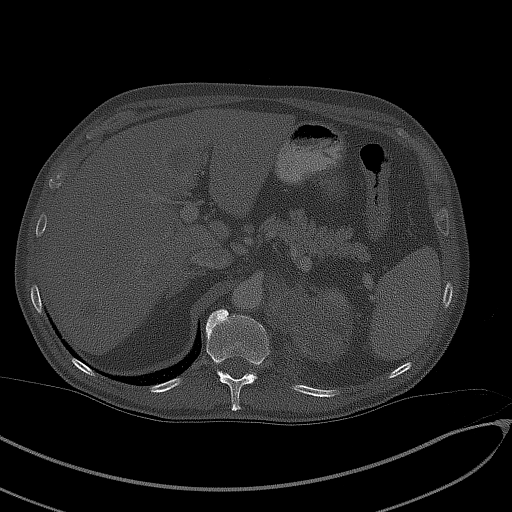
[im 56/182  bone]
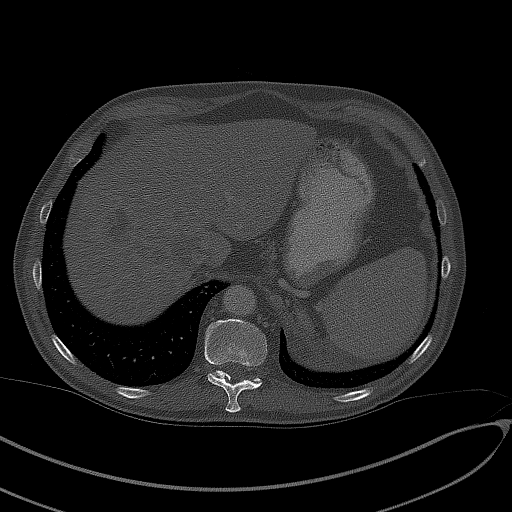

[Series 5: coronals · coronal · 0.87mm/px · 3 of 146 slices shown]
[im 30/146  mediastinal]
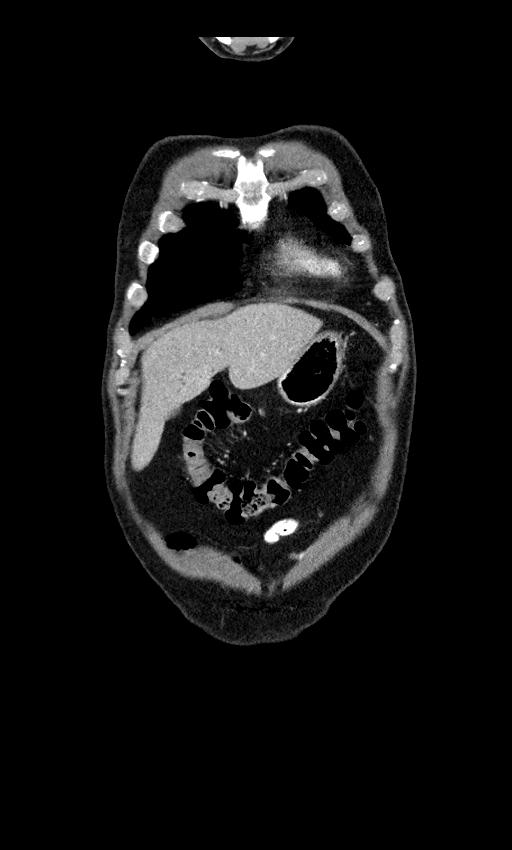
[im 59/146  mediastinal]
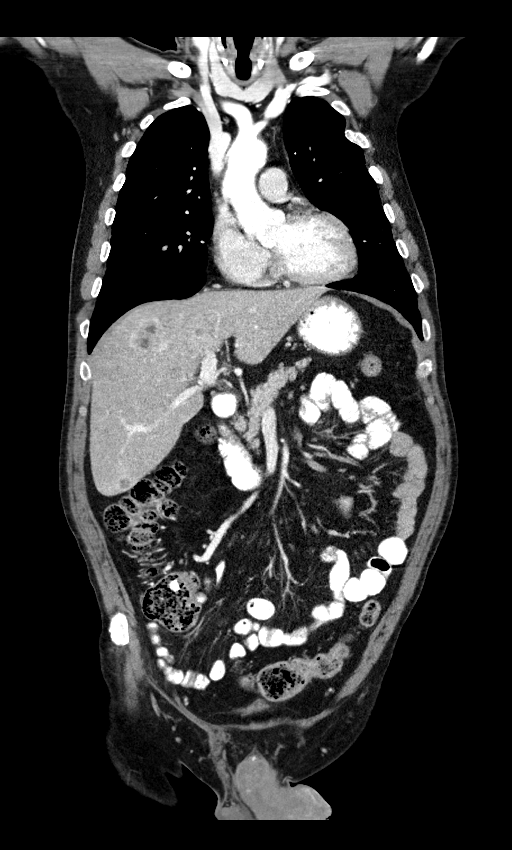
[im 88/146  mediastinal]
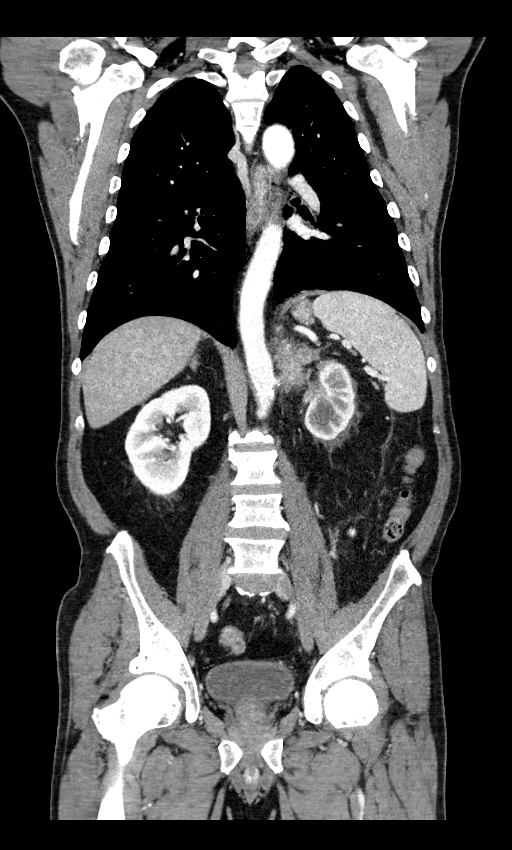

[14 of 36 positions shown; findings below may reference images not displayed]

FINDINGS: CT CHEST FINDINGS

Cardiovascular: Normal heart size. No significant pericardial
effusion/thickening. Mildly atherosclerotic nonaneurysmal thoracic
aorta. Normal caliber pulmonary arteries. No central pulmonary
emboli.

Mediastinum/Nodes: No discrete thyroid nodules. Unremarkable
esophagus. No axillary adenopathy. Newly mildly enlarged 1.0 cm
short axis diameter left hilar node (series 2/image 30). No
additional pathologically enlarged mediastinal or hilar nodes.

Lungs/Pleura: No pneumothorax. No pleural effusion. Mild-to-moderate
patchy centrilobular ground-glass opacities in the superior segment
right lower lobe and medial right lower lobe, new. No acute
consolidative airspace disease, lung masses or significant pulmonary
nodules.

Musculoskeletal: No aggressive appearing focal osseous lesions.
Healed lateral left mid rib deformities. Mild-to-moderate thoracic
spondylosis.

CT ABDOMEN PELVIS FINDINGS

Hepatobiliary: Numerous (greater than 10) liver masses scattered
throughout the liver with targetoid enhancement, increased in size
and number. Representative 3.9 x 3.7 cm segment 8 right liver mass
(series 2/image 53), increased from 1.8 x 1.7 cm using similar
measurement technique. Segment 4A left liver 2.9 x 2.7 cm mass
(series 2/image 58), new. Inferior right liver 2.2 x 2.1 cm mass
(series 2/image 74), increased from 1.3 x 1.2 cm. Normal gallbladder
with no radiopaque cholelithiasis. No biliary ductal dilatation.

Pancreas: Normal, with no mass or duct dilation.

Spleen: Normal size. No mass.

Adrenals/Urinary Tract: Mildly lobulated right adrenal gland without
discrete right adrenal nodules, unchanged. Infiltrative
heterogeneous solid 6.8 x 4.8 cm upper left renal mass replacing
much of the left renal sinus and encasing the left adrenal gland and
invading the left diaphragmatic crus (series 2/image 58), previously
7.6 x 6.1 cm, mildly decreased. Nonobstructing 2 mm lower left renal
stone. No right renal masses. No right hydronephrosis. Chronic mild
left caliectasis. Normal nondistended bladder.

Stomach/Bowel: Normal non-distended stomach. Normal caliber small
bowel with no small bowel wall thickening. Appendectomy. Oral
contrast reaches the right colon. Normal large bowel with no
diverticulosis, large bowel wall thickening or pericolonic fat
stranding.

Vascular/Lymphatic: Atherosclerotic nonaneurysmal abdominal aorta.
Patent hepatic, portal, splenic and renal veins with stable partial
encasement of the left renal vein by the upper left renal mass. No
pathologically enlarged lymph nodes in the abdomen or pelvis.

Reproductive: Normal size prostate with nonspecific coarse right
prostatic calcifications.

Other: No pneumoperitoneum, ascites or focal fluid collection.

Musculoskeletal: No aggressive appearing focal osseous lesions.
Moderate lumbar spondylosis.
IMPRESSION: 1. Interval progression of numerous liver metastases.
2. Infiltrative upper left renal mass is mildly decreased in size.
3. New mild left hilar lymphadenopathy, suspicious for nodal
metastasis.
4. New mild-to-moderate patchy centrilobular ground-glass opacities
in the right greater than left lower lobes, favor inflammatory
etiology, with the differential including aspiration.
5. Chronic findings include: Nonobstructing left nephrolithiasis.
Aortic Atherosclerosis ([id number].O).

## 2023-07-29 IMAGING — CT CT CHEST-ABD-PELV W/ CM
3 of 5 series · 13 of 36 positions shown, 14 images · IV contrast (agent unspecified)
Comparison: CT the chest, abdomen and pelvis 02/05/2021.

CLINICAL DATA: 70-year-old male with history of metastatic
urothelial carcinoma undergoing ongoing chemotherapy. Follow-up
study.

EXAM:
CT CHEST, ABDOMEN, AND PELVIS WITH CONTRAST
TECHNIQUE: Multidetector CT imaging of the chest, abdomen and pelvis was
performed following the standard protocol during bolus
administration of intravenous contrast.

[Series 2: cap with · axial · 0.85mm/px · z∈[-674,-144]mm · 8 of 138 slices shown, 9 images]
[im 16/138  mediastinal]
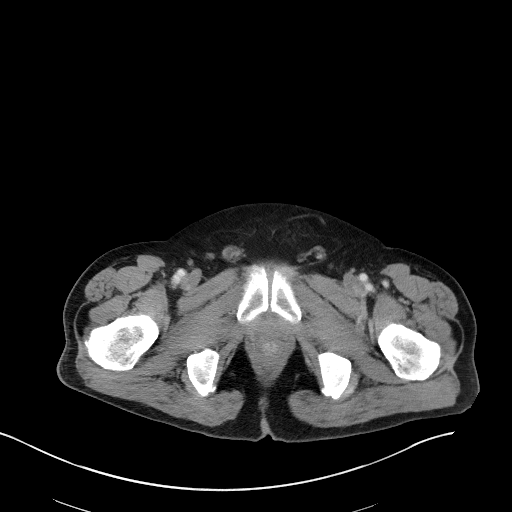
[im 16/138  bone]
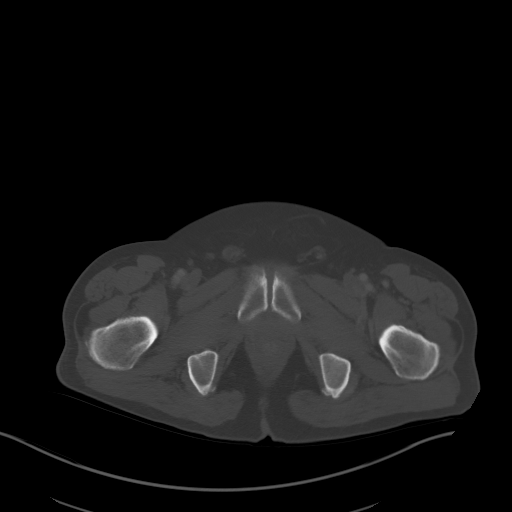
[im 31/138  mediastinal]
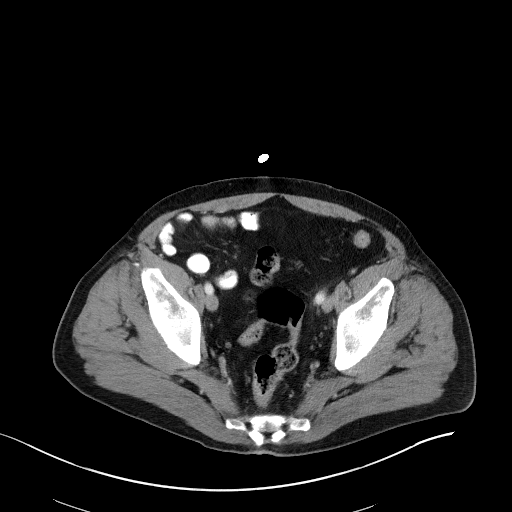
[im 46/138  mediastinal]
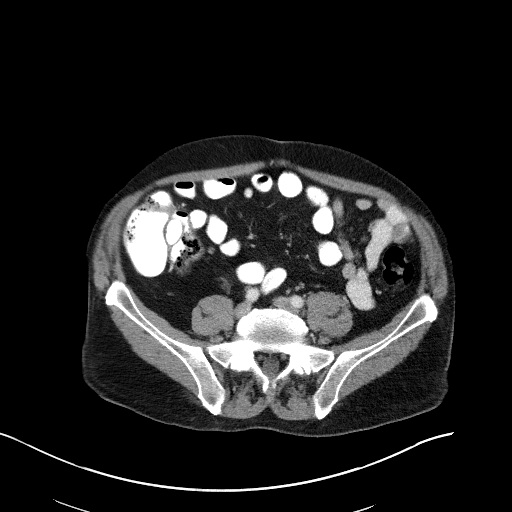
[im 61/138  mediastinal]
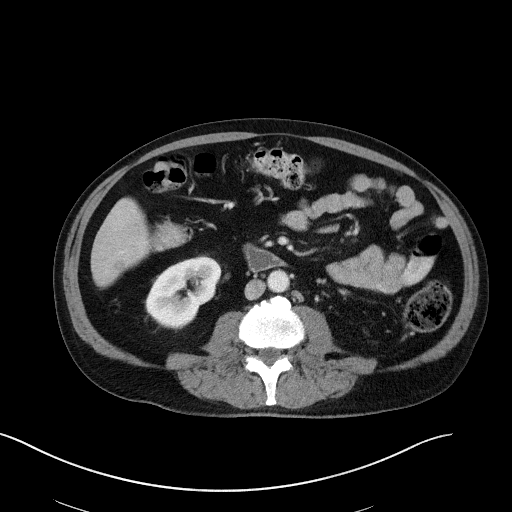
[im 77/138  mediastinal]
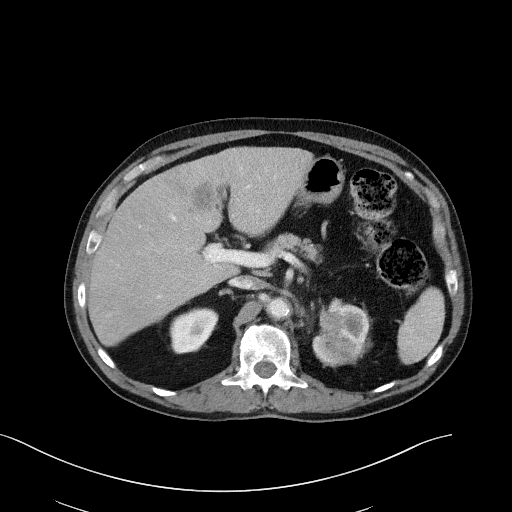
[im 92/138  mediastinal]
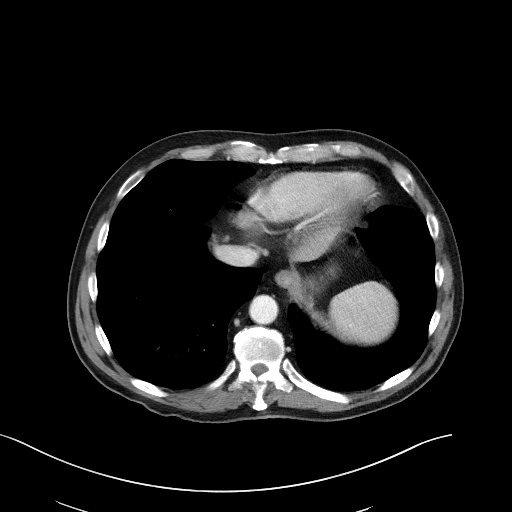
[im 107/138  mediastinal]
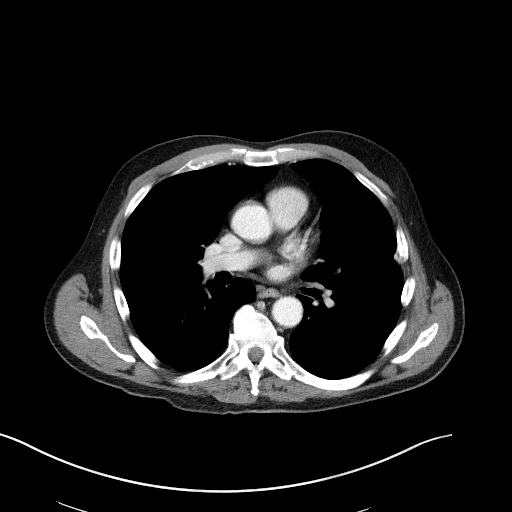
[im 122/138  mediastinal]
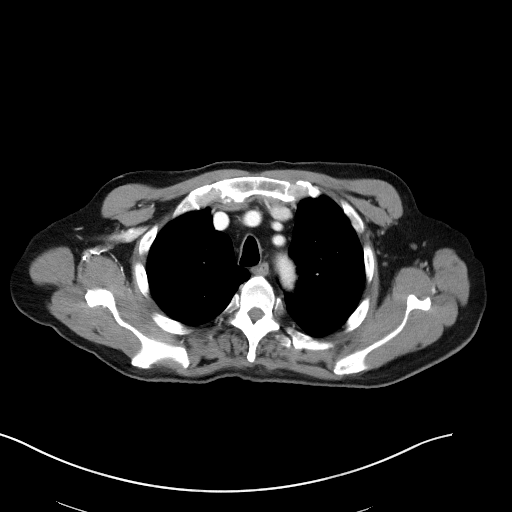

[Series 3: coronals · coronal · 0.89mm/px · 3 of 162 slices shown]
[im 33/162  mediastinal]
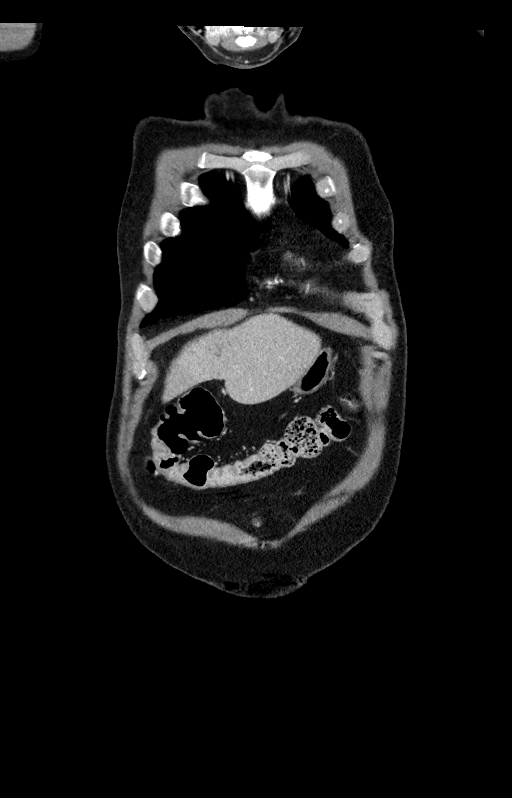
[im 65/162  mediastinal]
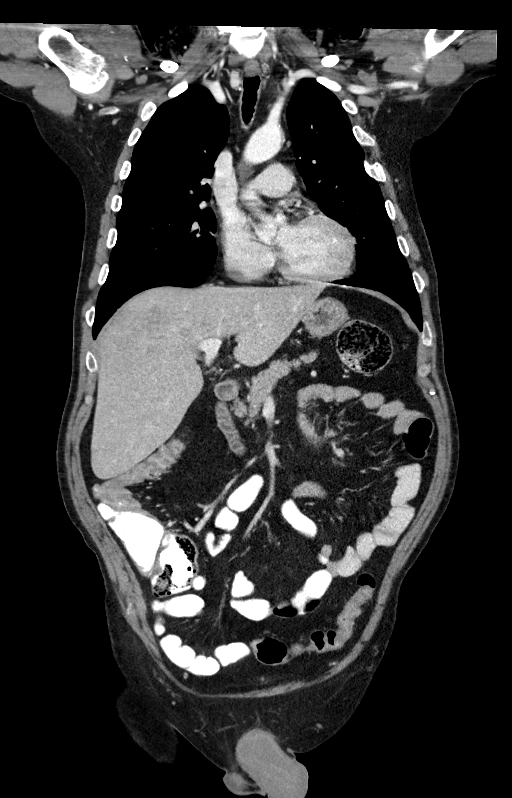
[im 97/162  mediastinal]
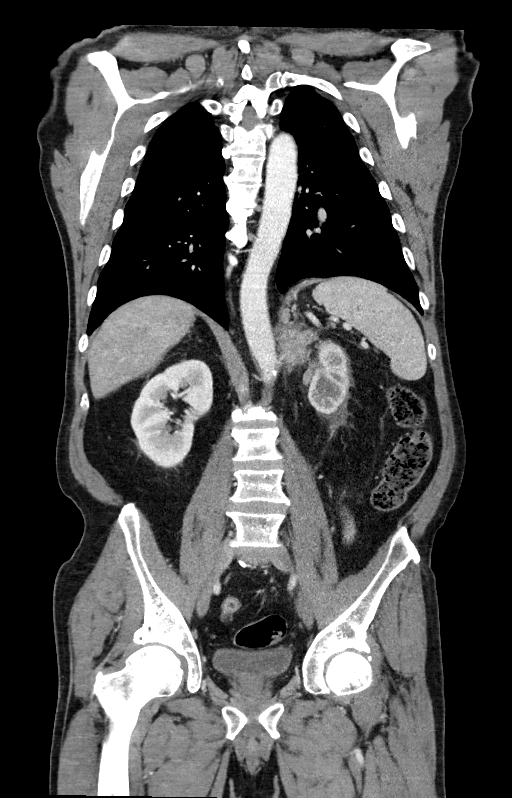

[Series 4: lung · axial · 0.85mm/px · z∈[-442,-384]mm · 2 of 204 slices shown]
[im 15/204  bone]
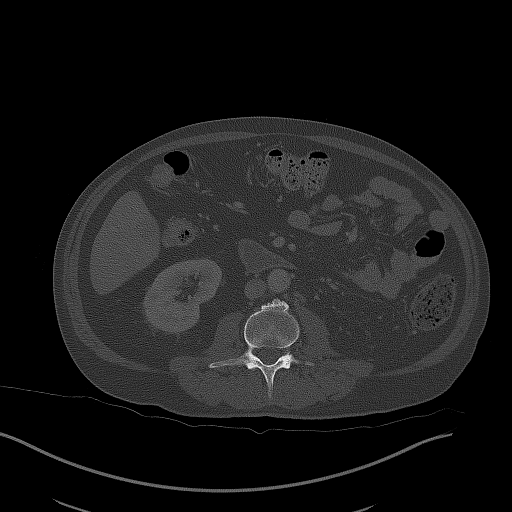
[im 44/204  bone]
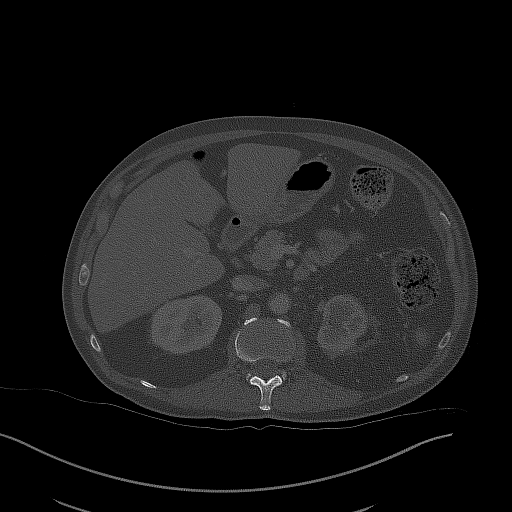

[13 of 36 positions shown; findings below may reference images not displayed]

RADIATION DOSE REDUCTION: This exam was performed according to the
departmental dose-optimization program which includes automated
exposure control, adjustment of the mA and/or kV according to
patient size and/or use of iterative reconstruction technique.

CONTRAST:  100mL OMNIPAQUE IOHEXOL 300 MG/ML  SOLN
FINDINGS: CT CHEST FINDINGS

Cardiovascular: Heart size is normal. There is no significant
pericardial fluid, thickening or pericardial calcification.
Atherosclerotic calcifications in the thoracic aorta. No definite
coronary artery calcifications.

Mediastinum/Nodes: No pathologically enlarged mediastinal or hilar
lymph nodes. Esophagus is unremarkable in appearance. No axillary
lymphadenopathy.

Lungs/Pleura: No suspicious appearing pulmonary nodules or masses
are noted. No acute consolidative airspace disease. No pleural
effusions. Mild scarring in the inferior segment of the lingula.

Musculoskeletal: There are no aggressive appearing lytic or blastic
lesions noted in the visualized portions of the skeleton.

CT ABDOMEN PELVIS FINDINGS

Hepatobiliary: Numerous hypovascular hepatic lesions appear
generally similar in number and size compared to the prior
examination, although these have changed qualitatively in
appearance, with the vast majority of these lesions demonstrating
far more hypovascular and centrally low-attenuation than previously
noted. The largest lesion on today's study is an infiltrative lesion
in the central aspect of the liver which appears to partially invade
the intrahepatic portion of the inferior vena cava (coronal image 74
of series 3 and axial image 53 of series 2) measuring 4.1 x 3.0 x
3.3 cm. Another prominent lesion in segment 8 of the liver (axial
image 53 of series 2) currently measures 3.4 x 3.3 cm (previously
3.9 x 3.7 cm) and is far more centrally low-attenuation than the
prior study. Another lesion in segment 4B of the liver (axial image
61 of series 2) has also slightly decreased in size compared to the
prior examination, currently measuring 2.9 x 2.1 cm (previously
x 2.7 cm), and is far more centrally low-attenuation. No definite
new hepatic lesions. There is a small amount of intrahepatic biliary
ductal dilatation which appears isolated predominantly in segment 4A
of the liver, likely related to some central obstruction from the
lesion in segment 4A described above. No extrahepatic biliary ductal
dilatation. Gallbladder is normal in appearance.

Pancreas: No pancreatic mass. No pancreatic ductal dilatation. No
pancreatic or peripancreatic fluid collections or inflammatory
changes.

Spleen: Unremarkable.

Adrenals/Urinary Tract: Right kidney and right adrenal gland are
normal in appearance. Infiltrative heterogeneously enhancing lesion
in the left kidney which extends into the adjacent portions of the
left retroperitoneum and invades the crus of the left hemidiaphragm,
poorly defined and therefore difficult to accurately measure, but
estimated to measure approximately 7.8 x 3.1 x 6.1 cm (axial image
58 of series 2 and coronal image 102 of series 3). This lesion again
appears to extend into the left renal vein, and is diffusely
infiltrative throughout the interpolar and upper pole region of the
left kidney. 3 mm nonobstructive calculus in the lower pole
collecting system of left kidney. No hydroureteronephrosis. Urinary
bladder is normal in appearance. Left adrenal gland is not
confidently identified and is likely encapsulated by the malignant
left renal neoplasm.

Stomach/Bowel: The appearance of the stomach is normal. There is no
pathologic dilatation of small bowel or colon. The appendix is not
confidently identified and may be surgically absent. Regardless,
there are no inflammatory changes noted adjacent to the cecum to
suggest the presence of an acute appendicitis at this time.

Vascular/Lymphatic: Aortic atherosclerosis, without evidence of
aneurysm or dissection in the abdominal or pelvic vasculature. There
are multiple prominent borderline enlarged retroperitoneal lymph
nodes, but no definite pathologically enlarged lymph nodes
confidently identified in the abdomen or pelvis.

Reproductive: Prostate gland and seminal vesicles are unremarkable
in appearance.

Other: No significant volume of ascites.  No pneumoperitoneum.

Musculoskeletal: There are no aggressive appearing lytic or blastic
lesions noted in the visualized portions of the skeleton.
IMPRESSION: 1. There are qualitative changes in numerous intrahepatic
metastases, favored to reflect a positive response to therapy with
developing areas of internal necrosis within the metastatic lesions.
No new intrahepatic metastases are noted.
2. Infiltrative mass in the left kidney similar to the prior
examination. This again encases the left adrenal gland and directly
invades the crus of the left hemidiaphragm.
3. Previously noted left hilar lymphadenopathy has resolved.
4. Aortic atherosclerosis.
5. 3 mm nonobstructive calculus in the lower pole collecting system
of the left kidney.
6. Additional incidental findings, as above.
